# Patient Record
Sex: Male | Born: 1970 | Race: White | Hispanic: No | Marital: Married | Smoking: Never smoker
Health system: Southern US, Community
[De-identification: ages and names within clinical notes are randomized; demographics above are authoritative.]

## PROBLEM LIST (undated history)

## (undated) HISTORY — PX: WRIST SURGERY: SHX841

## (undated) HISTORY — PX: ELBOW SURGERY: SHX618

---

## 2008-03-11 ENCOUNTER — Encounter: Admission: RE | Admit: 2008-03-11 | Discharge: 2008-03-11 | Payer: Self-pay | Admitting: Emergency Medicine

## 2009-02-21 ENCOUNTER — Encounter: Admission: RE | Admit: 2009-02-21 | Discharge: 2009-02-21 | Payer: Self-pay | Admitting: Family Medicine

## 2010-07-24 ENCOUNTER — Encounter: Payer: Self-pay | Admitting: Family Medicine

## 2016-12-14 ENCOUNTER — Encounter (HOSPITAL_COMMUNITY): Payer: Self-pay | Admitting: Emergency Medicine

## 2016-12-14 ENCOUNTER — Emergency Department (HOSPITAL_COMMUNITY)
Admission: EM | Admit: 2016-12-14 | Discharge: 2016-12-14 | Disposition: A | Discharge status: Home / Self Care | Payer: Self-pay | Attending: Emergency Medicine | Admitting: Emergency Medicine

## 2016-12-14 ENCOUNTER — Emergency Department (HOSPITAL_COMMUNITY): Payer: Self-pay

## 2016-12-14 DIAGNOSIS — R079 Chest pain, unspecified: Secondary | ICD-10-CM | POA: Insufficient documentation

## 2016-12-14 LAB — CBC
HCT: 47.3 % (ref 39.0–52.0)
HEMOGLOBIN: 15.8 g/dL (ref 13.0–17.0)
MCH: 29.9 pg (ref 26.0–34.0)
MCHC: 33.4 g/dL (ref 30.0–36.0)
MCV: 89.4 fL (ref 78.0–100.0)
Platelets: 248 10*3/uL (ref 150–400)
RBC: 5.29 MIL/uL (ref 4.22–5.81)
RDW: 13.6 % (ref 11.5–15.5)
WBC: 8.9 10*3/uL (ref 4.0–10.5)

## 2016-12-14 LAB — BASIC METABOLIC PANEL
ANION GAP: 9 (ref 5–15)
BUN: 19 mg/dL (ref 6–20)
CALCIUM: 9.6 mg/dL (ref 8.9–10.3)
CO2: 25 mmol/L (ref 22–32)
Chloride: 103 mmol/L (ref 101–111)
Creatinine, Ser: 1 mg/dL (ref 0.61–1.24)
Glucose, Bld: 112 mg/dL — ABNORMAL HIGH (ref 65–99)
Potassium: 3.6 mmol/L (ref 3.5–5.1)
SODIUM: 137 mmol/L (ref 135–145)

## 2016-12-14 LAB — I-STAT TROPONIN, ED
TROPONIN I, POC: 0 ng/mL (ref 0.00–0.08)
Troponin i, poc: 0 ng/mL (ref 0.00–0.08)

## 2016-12-14 MED ORDER — SODIUM CHLORIDE 0.9 % IV BOLUS (SEPSIS)
1000.0000 mL | Freq: Once | INTRAVENOUS | Status: AC
  Administered 2016-12-14: 1000 mL via INTRAVENOUS

## 2016-12-14 NOTE — ED Notes (Signed)
Cardiology at bedside.

## 2016-12-14 NOTE — ED Triage Notes (Signed)
Onset this week left side chest discomfort seen at urgent care sent to ED for evaluation. States feels like passing out .

## 2016-12-14 NOTE — Discharge Instructions (Signed)
Please follow-up with your doctor for evaluation and treatment of your chest discomfort and other symptoms. Make sure to stay well hydrated. Please return to emergency department immediately if you develop any new or worsening symptoms.

## 2016-12-14 NOTE — ED Provider Notes (Signed)
[facility]-EMERGENCY DEPT Provider Note   CSN: [id number] Arrival date & time: 12/14/16  1151     History   Chief Complaint Chief Complaint  Patient presents with  . Chest Pain    HPI Alec Reynolds is a 46 y.o. male who is previously healthy who presents with a one-week history of intermittent chest discomfort. He describes his pain as intermittent. He notices it most at rest. He describes it as a tightness sensation in the left side of his chest. He has had associated left arm pain, however he equates this to similar feeling he has with his tennis elbow. Patient has also felt more fatigued lately and has had times where he feels that he is going to pass out. Patient denies any fevers, shortness of breath, abdominal pain, nausea, vomiting, urinary symptoms. Patient has not taken any medications at home for his symptoms. 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 He is also been working out and cycling a lot more. Patient does not admit to any more stress in his life than usual.  HPI  History reviewed. No pertinent past medical history.  There are no active problems to display for this patient.   History reviewed. No pertinent surgical history.     Home Medications    Prior to Admission medications   Medication Sig Start Date End Date Taking? Authorizing Provider  fluticasone (FLONASE) 50 MCG/ACT nasal spray Place 2 sprays into both nostrils daily.   Yes [provider]  ibuprofen (ADVIL,MOTRIN) 200 MG tablet Take 200 mg by mouth every 6 (six) hours as needed for fever.   Yes [provider]    Family History No family history on file.  Social History Social History  Substance Use Topics  . Smoking status: Never Smoker  . Smokeless tobacco: Never Used  . Alcohol use Yes     Allergies   Patient has no known allergies.   Review of Systems Review of Systems  Constitutional: Positive for fatigue. Negative for chills  and fever.  HENT: Negative for facial swelling and sore throat.   Respiratory: Negative for shortness of breath.   Cardiovascular: Positive for chest pain ("discomfort").  Gastrointestinal: Negative for abdominal pain, nausea and vomiting.  Genitourinary: Negative for dysuria.  Musculoskeletal: Negative for back pain.  Skin: Negative for rash and wound.  Neurological: Negative for headaches.  Psychiatric/Behavioral: The patient is not nervous/anxious.      Physical Exam Updated Vital Signs BP 111/71   Pulse (!) 53   Temp 97.2 F (36.2 C) (Oral)   Resp 16   Ht 6\' 4"  (1.93 m)   Wt 90.7 kg (200 lb)   SpO2 98%   BMI 24.34 kg/m   Physical Exam  Constitutional: He appears well-developed and well-nourished. No distress.  HENT:  Head: Normocephalic and atraumatic.  Mouth/Throat: Oropharynx is clear and moist. No oropharyngeal exudate.  Eyes: Conjunctivae are normal. Pupils are equal, round, and reactive to light. Right eye exhibits no discharge. Left eye exhibits no discharge. No scleral icterus.  Neck: Normal range of motion. Neck supple. No thyromegaly present.  Cardiovascular: Regular rhythm, normal heart sounds and intact distal pulses.  Exam reveals no gallop and no friction rub.   No murmur heard. Pulmonary/Chest: Effort normal and breath sounds normal. No stridor. No respiratory distress. He has no wheezes. He has no rales.  Abdominal: Soft. Bowel sounds are normal. He exhibits no distension. There is no tenderness. There is no  rebound and no guarding.  Musculoskeletal: He exhibits no edema.  Lymphadenopathy:    He has no cervical adenopathy.  Neurological: He is alert. Coordination normal.  Skin: Skin is warm and dry. No rash noted. He is not diaphoretic. No pallor.  Psychiatric: He has a normal mood and affect.  Nursing note and vitals reviewed.    ED Treatments / Results  Labs (all labs ordered are listed, but only abnormal results are displayed) Labs Reviewed    BASIC METABOLIC PANEL - Abnormal; Notable for the following:       Result Value   Glucose, Bld 112 (*)    All other components within normal limits  CBC  I-STAT TROPOININ, ED  I-STAT TROPOININ, ED    EKG  EKG Interpretation None       Radiology Dg Chest 2 View  Result Date: 12/14/2016 CLINICAL DATA:  Chest discomfort for 1 week EXAM: CHEST  2 VIEW COMPARISON:  None. FINDINGS: Lungs are clear. Heart size and pulmonary vascularity are normal. No adenopathy. No pneumothorax. No bone lesions. IMPRESSION: No edema or consolidation. Electronically Signed   By: Bretta BangWilliam  Woodruff III M.D.   On: 12/14/2016 13:12    Procedures Procedures (including critical care time)  Medications Ordered in ED Medications  sodium chloride 0.9 % bolus 1,000 mL (0 mLs Intravenous Stopped 12/14/16 1435)  sodium chloride 0.9 % bolus 1,000 mL (0 mLs Intravenous Stopped 12/14/16 1559)     Initial Impression / Assessment and Plan / ED Course  I have reviewed the triage vital signs and the nursing notes.  Pertinent labs & imaging results that were available during my care of the patient were reviewed by me and considered in my medical decision making (see chart for details).     CBC, BMP, delta troponin negative. PERC negative. CXR shows no active cardiopulmonary disease. EKG shows NSR. Patient with heart rates in the 50s, however patient states this is normal for him. Patient is an active tennis pro in the area. Doubt ACS. Patient given 2 L of fluid in the ED. Patient asymptomatic in the ED. Patient to follow up with PCP for further evaluation and treatment of symptoms. Strict return precautions discussed. Patient understands and agrees with plan. Patient vitals stable throughout ED course and discharged in satisfactory condition. I discussed patient case with Dr. Madilyn Hookees who guided the patient's management and agrees with plan.   Final Clinical Impressions(s) / ED Diagnoses   Final diagnoses:  Nonspecific  chest pain    New Prescriptions New Prescriptions   No medications on file     Verdis PrimeLaw, Cass Vandermeulen M, PA-C 12/14/16 1600    Tilden Fossaees, Elizabeth, MD 12/15/16 1029

## 2016-12-17 ENCOUNTER — Ambulatory Visit (INDEPENDENT_AMBULATORY_CARE_PROVIDER_SITE_OTHER)
Admission: RE | Admit: 2016-12-17 | Discharge: 2016-12-17 | Disposition: A | Discharge status: Home / Self Care | Payer: Self-pay | Source: Ambulatory Visit | Attending: Cardiology | Admitting: Cardiology

## 2016-12-17 ENCOUNTER — Other Ambulatory Visit (HOSPITAL_COMMUNITY): Payer: Self-pay | Admitting: *Deleted

## 2016-12-17 ENCOUNTER — Ambulatory Visit (INDEPENDENT_AMBULATORY_CARE_PROVIDER_SITE_OTHER): Payer: No Typology Code available for payment source | Admitting: Cardiology

## 2016-12-17 ENCOUNTER — Encounter: Payer: Self-pay | Admitting: Cardiology

## 2016-12-17 VITALS — BP 126/68 | HR 66 | Ht 76.0 in | Wt 201.8 lb

## 2016-12-17 DIAGNOSIS — R079 Chest pain, unspecified: Secondary | ICD-10-CM

## 2016-12-17 DIAGNOSIS — R42 Dizziness and giddiness: Secondary | ICD-10-CM | POA: Diagnosis not present

## 2016-12-17 DIAGNOSIS — R0789 Other chest pain: Secondary | ICD-10-CM

## 2016-12-17 NOTE — Patient Instructions (Signed)
Medication Instructions:  Continue current medications  Labwork: None Ordered  Testing/Procedures: Your physician has requested that you have an exercise tolerance test. For further information please visit [email or web address]. Please also follow instruction sheet, as given.  Your physician has requested that you have a Coronary Calcium Score Test.   Follow-Up: Your physician recommends that you schedule a follow-up appointment in: As Needed   Any Other Special Instructions Will Be Listed Below (If Applicable).   If you need a refill on your cardiac medications before your next appointment, please call your pharmacy.

## 2016-12-17 NOTE — Progress Notes (Signed)
Cardiology Office Note   Date:  12/17/2016   ID:  Alec Reynolds, DOB 30-Jul-1970, MRN [id number]  PCP:  Ileana LaddWong, Francis P, MD  Cardiologist:   Rollene RotundaJames Stana Bayon, MD  Referring:  Self  Chief Complaint  Patient presents with  . Chest Pain      History of Present Illness: Alec Reynolds is a 46 y.o. male who presents for evaluation of chest pain.  He has no past cardiac history.  He was seen in the ED on Friday with chest pain and had no objective evidence of ischemia.  I reviewed these records for this visit.  He said he had been feeling exactly what last week. He been more fatigued. He's had some slightly increased fatigue over time. He is a Patent attorneytennis professional and teaches.  On Friday and felt like he had some headaches. He was dizzy. He is fatigued. He actually at one point had to go down to one knee because he was somewhat lightheaded. He presented to urgent care and I don't have these records. He's always had a low heart rate was told it was low and his EKG was irregular but I don't have record of this. He was told he should follow-up with a cardiologist. However, because he was uncomfortable and anxious about this and presented to the emergency room. There were no abnormalities on his EKG there. He was given some IV hydration. Labs were okay. He didn't exercise over the weekend. He's had a nagging uncomfortable sensation with a sense of anxiety in his chest. He's not describing chest or arm discomfort. He's not been having any palpitations and has had no further presyncope or syncope. He's had no shortness of breath, PND or orthopnea. He has had some increased fatigue and slightly decreased exercise tolerance.   PMH:  None  Past Surgical History:  Procedure Laterality Date  . WRIST SURGERY       Current Outpatient Prescriptions  Medication Sig Dispense Refill  . fluticasone (FLONASE) 50 MCG/ACT nasal spray Place 2 sprays into both nostrils daily.    Marland Kitchen. ibuprofen  (ADVIL,MOTRIN) 200 MG tablet Take 200 mg by mouth every 6 (six) hours as needed for fever.     No current facility-administered medications for this visit.     Allergies:   Patient has no known allergies.    Social History:  The patient  reports that he has never smoked. He has never used smokeless tobacco. He reports that he drinks alcohol. He reports that he does not use drugs.   Family History:  The patient's family history includes Stroke (age of onset: 6765) in his father.    ROS:  Please see the history of present illness.   Otherwise, review of systems are positive for none.   All other systems are reviewed and negative.    PHYSICAL EXAM: VS:  BP 126/68   Pulse 66   Ht 6\' 4"  (1.93 m)   Wt 201 lb 12.8 oz (91.5 kg)   BMI 24.56 kg/m  , BMI Body mass index is 24.56 kg/m. GENERAL:  Well appearing HEENT:  Pupils equal round and reactive, fundi not visualized, oral mucosa unremarkable NECK:  No jugular venous distention, waveform within normal limits, carotid upstroke brisk and symmetric, no bruits, no thyromegaly LYMPHATICS:  No cervical, inguinal adenopathy LUNGS:  Clear to auscultation bilaterally BACK:  No CVA tenderness CHEST:  Unremarkable HEART:  PMI not displaced or sustained,S1 and S2 within normal limits, no S3, no S4, no clicks, no  rubs, no murmurs ABD:  Flat, positive bowel sounds normal in frequency in pitch, no bruits, no rebound, no guarding, no midline pulsatile mass, no hepatomegaly, no splenomegaly EXT:  2 plus pulses throughout, no edema, no cyanosis no clubbing SKIN:  No rashes no nodules NEURO:  Cranial nerves II through XII grossly intact, motor grossly intact throughout PSYCH:  Cognitively intact, oriented to person place and time    EKG:  EKG is not ordered today. The ekg ordered today demonstrates sinus rhythm, rate 63, axis within normal limits, intervals within normal limits, no acute ST-T wave changes.   Recent Labs: 12/14/2016: BUN 19;  Creatinine, Ser 1.00; Hemoglobin 15.8; Platelets 248; Potassium 3.6; Sodium 137    Lipid Panel No results found for: CHOL, TRIG, HDL, CHOLHDL, VLDL, LDLCALC, LDLDIRECT    Wt Readings from Last 3 Encounters:  12/17/16 201 lb 12.8 oz (91.5 kg)  12/14/16 200 lb (90.7 kg)      Other studies Reviewed: Additional studies/ records that were reviewed today include: ED records. Review of the above records demonstrates:  Please see elsewhere in the note.     ASSESSMENT AND PLAN:  CHEST PAIN:  I will bring the patient back for a POET (Plain Old Exercise Test). This will allow me to screen for obstructive coronary disease, risk stratify and very importantly provide a prescription for exercise.  I will also order a coronary calcium score.  DIZZINESS:  If this continues he might need an event monitor or Alive Cor  ANXIETY:  He has some increased stress and a sense of anxiety and we had a long discussion about this.     Current medicines are reviewed at length with the patient today.  The patient does not have concerns regarding medicines.  The following changes have been made:  no change  Labs/ tests ordered today include:   Orders Placed This Encounter  Procedures  . CT CARDIAC SCORING  . EXERCISE TOLERANCE TEST     Disposition:   FU with me as needed.      Signed, Rollene Rotunda, MD  12/17/2016 5:25 PM    [facility]

## 2016-12-18 ENCOUNTER — Encounter (HOSPITAL_COMMUNITY): Payer: Self-pay

## 2016-12-18 ENCOUNTER — Ambulatory Visit (HOSPITAL_COMMUNITY)
Admission: RE | Admit: 2016-12-18 | Discharge: 2016-12-18 | Disposition: A | Discharge status: Home / Self Care | Payer: No Typology Code available for payment source | Source: Ambulatory Visit | Attending: Cardiovascular Disease | Admitting: Cardiovascular Disease

## 2016-12-18 DIAGNOSIS — R079 Chest pain, unspecified: Secondary | ICD-10-CM | POA: Insufficient documentation

## 2016-12-19 LAB — EXERCISE TOLERANCE TEST
CHL CUP MPHR: 174 {beats}/min
CHL CUP RESTING HR STRESS: 67 {beats}/min
CSEPHR: 99 %
CSEPPHR: 173 {beats}/min
Estimated workload: 16.6 METS
Exercise duration (min): 13 min
Exercise duration (sec): 45 s
RPE: 18

## 2018-11-06 ENCOUNTER — Other Ambulatory Visit: Payer: Self-pay | Admitting: Family Medicine

## 2018-11-06 ENCOUNTER — Ambulatory Visit
Admission: RE | Admit: 2018-11-06 | Discharge: 2018-11-06 | Disposition: A | Discharge status: Home / Self Care | Payer: No Typology Code available for payment source | Source: Ambulatory Visit | Attending: Family Medicine | Admitting: Family Medicine

## 2018-11-06 ENCOUNTER — Other Ambulatory Visit: Payer: Self-pay

## 2018-11-06 DIAGNOSIS — M5416 Radiculopathy, lumbar region: Secondary | ICD-10-CM

## 2018-11-06 DIAGNOSIS — M25551 Pain in right hip: Secondary | ICD-10-CM

## 2018-12-01 ENCOUNTER — Telehealth (HOSPITAL_COMMUNITY): Payer: Self-pay | Admitting: Cardiology

## 2018-12-01 NOTE — Telephone Encounter (Signed)
Left message for patient to call back.  Need to schedule New Pt appt for tomorrow 12/02/2018.

## 2018-12-05 NOTE — Telephone Encounter (Signed)
Called and left message for pt to call me.  Dr. Shirlee Latch wanted to see if pt could come 1:30 or 2:30 this afternoon in-person for New CHF appt.

## 2018-12-08 NOTE — Telephone Encounter (Signed)
Called and left message for patient to call back.  Trying to see if pt can come 12/09/2018 @2 :40 for in-person visit with Dr. Aundra Dubin.

## 2019-12-22 ENCOUNTER — Encounter: Payer: Self-pay | Admitting: Physical Therapy

## 2019-12-22 ENCOUNTER — Other Ambulatory Visit: Payer: Self-pay

## 2019-12-22 ENCOUNTER — Ambulatory Visit: Payer: BC Managed Care – PPO | Attending: Physician Assistant | Admitting: Physical Therapy

## 2019-12-22 DIAGNOSIS — M5442 Lumbago with sciatica, left side: Secondary | ICD-10-CM | POA: Diagnosis present

## 2019-12-22 DIAGNOSIS — M4125 Other idiopathic scoliosis, thoracolumbar region: Secondary | ICD-10-CM | POA: Diagnosis present

## 2019-12-22 DIAGNOSIS — M545 Low back pain, unspecified: Secondary | ICD-10-CM | POA: Insufficient documentation

## 2019-12-22 DIAGNOSIS — G8929 Other chronic pain: Secondary | ICD-10-CM | POA: Insufficient documentation

## 2019-12-22 DIAGNOSIS — R350 Frequency of micturition: Secondary | ICD-10-CM | POA: Insufficient documentation

## 2019-12-22 DIAGNOSIS — R102 Pelvic and perineal pain: Secondary | ICD-10-CM

## 2019-12-22 DIAGNOSIS — R293 Abnormal posture: Secondary | ICD-10-CM | POA: Insufficient documentation

## 2019-12-23 DIAGNOSIS — R102 Pelvic and perineal pain: Secondary | ICD-10-CM | POA: Insufficient documentation

## 2019-12-23 DIAGNOSIS — R350 Frequency of micturition: Secondary | ICD-10-CM | POA: Insufficient documentation

## 2019-12-23 NOTE — Patient Instructions (Signed)
Open book ( handout)  ° °

## 2019-12-23 NOTE — Therapy (Addendum)
[facility] MAIN [facility] SERVICES [address] [place], [place], [address] Phone: [phone]   Fax:  [phone]  Physical Therapy Evaluation  Patient Details  Name: Daytona Cuttriss-Curtis MRN: [id number] Date of Birth: 1970-11-26 Referring Provider (PT): Fosnight    Encounter Date: 12/22/2019   PT End of Session - 12/23/19 1250    Visit Number 1    Number of Visits 10    Date for PT Re-Evaluation 03/02/20    PT Start Time 1504    PT Stop Time 1615    PT Time Calculation (min) 71 min           History reviewed. No pertinent past medical history.  Past Surgical History:  Procedure Laterality Date  . ELBOW SURGERY Left   . WRIST SURGERY      There were no vitals filed for this visit.    Subjective Assessment - 12/22/19 1520    Subjective 1)  Pelvic pain/ urinary urgency and frequency : Pt experienced urgency and frequency between 2016-2019. In 2019, pt experienced long periods of sitting with plane flight and other activities.  Pt experienced pain at the L anus to penis that worsened during the trip. Pt experienced increased urinary frequency. Pt has urgency to pee upon waking in the morning. Pain is not as bad (/10) was summer in 2019 ( 7/10) . Pain at that time, pain occurred with erection.  Pt rarely gets erections and when he gets an erection in the morning , it gets worse. Pt does everything he cannt have an erection due to the pain and discomfort. Currently the pain is constant in all positions sitting is worse. Pt has a pillow arrangement with pillows when lying down.  Gabapentin is helping with sleep and now getting 6 hours uninterrupted by pain and trips to bathroom. Currently nocturia has resolved. No leakage with urge.    2) Pain/ strain with bowel movements: Pt has 2 BM within an hour of waking up. Type 6 stools 75% of the time and 25% is stringy. Painful after bowel movements with need to pee.   Straining with bowel movements.    Bowl urgency occurs within minutes of waking up. Urinary frequency 1 x every 3 hours. No delay with emptying.  Pt is leaking less after urinating by 60% currently.     3) LBP  no sudden injury L sciatic  2012     Pertinent History R groin injury 2017 R ,  anxiety, stress Fx in R shin, low back as kid. 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 Pt also enjoyed cycling and stopped when the pain started. Pt used to cycle 2-3 hours 200 miles / day. Pt did not have a stretch routine. Pt completed Pelvic PT via telehealth one year and tried stretching but it made it worse.    Patient Stated Goals sit down for 2 hours  without hurting, Not have to feel like he has to pee all the time              [facility] PT Assessment - 12/22/19 1549      Assessment   Medical Diagnosis pelvic floor dysfunction     Referring Provider (PT) Fosnight       Precautions   Precautions None      Restrictions   Weight Bearing Restrictions No      Balance Screen  Has the patient fallen in the past 6 months No      Observation/Other Assessments   Observations slumped posture, guarded during assessment and Tx , able to relax with cues " relax onto the support beneath you"     Scoliosis R shoulder lower, L iliac crest higher, T/L junction concave curve    L sided handed      AROM   Overall AROM Comments R sideflexion limited > L, twinge of R LBP with R rotation ( Post Tx         Strength   Overall Strength --   R hip ext w/ compensation                      Objective measurements completed on examination: See above findings.     Pelvic Floor Special Questions - 12/22/19 1640    Diastasis Recti 4 fingers below umbilicus     External Perineal Exam through clothing, tenderness and tightness at R bulbospongiosus. ischiocavernosus, deep transverse perineal  ( post Tx: increased mobility and no tenderness            [facility] Adult PT  Treatment/Exercise - 12/23/19 1255      Therapeutic Activites    Therapeutic Activities Other Therapeutic Activities   POC, biopsychosocial, regional interdependent approaches    Other Therapeutic Activities discussed       Neuro Re-ed    Neuro Re-ed Details  cued for relaxation, HEP technique      Manual Therapy   Manual therapy comments medial glide at T/J junction with MWM to realign segments                  PT Long Term Goals - 01/21/20 1247      PT LONG TERM GOAL #1   Title Pt will demo decreased abdominal separation from 4 fingers width  to < 1 fingers width to improve IAP system for pelvic floor function    Time 4    Period Weeks    Status New    Target Date 01/19/20      PT LONG TERM GOAL #2   Title Pt will demo improved posture with more anterior tilt of pelvis, increased lumbar lordosis, and no cues for proper sitting/ standing posture  in order to minimize CLBP and return to more functional activities    Time 6    Period Weeks    Status New    Target Date 02/02/20      PT LONG TERM GOAL #3   Title Pt will demo no spinal deviation and more levelled pelvic girdle across 2 visits in order to advance towards deep core/ thoracolumbar strengthening exercises    Time 2    Period Weeks    Status New    Target Date 01/05/20      PT LONG TERM GOAL #4   Title Pt will demo decreased R pelvic floor mm tightness across 2 visits in order to minimize pelvic pain and straining with bowel movements and optimize IAP system properly    Time 8    Period Weeks    Status New    Target Date 02/16/20      PT LONG TERM GOAL #5   Title Pt's FOTO score for Pain to decrease from 29pts to < 24pts and Urinary from 38 pts to < 30 pts and Bowel from 17 pts to < 12 pts and  Bowel Leakage improve 59 pts to > 64 pts  Time 10    Target Date 03/15/20                          Plan - 12/23/19 1253    Clinical Impression Statement Pt is a 49 yo patient who is  experiencing pelvic pain with urinary frequency/ urgency, straining with bowel movements, and CLBP. These Sx impact his QOL and functional activities.   Pt's musculoskeletal assessment revealed spinal curvatures, uneven pelvic girdle/ shoulder height, weakness of deep core mm, diastasis recti, poor posture, limited spinal mobility ( R sideflexion/rotation more limited than L), increased pelvic floor mm tightness R > L, dyscoordination of deep core, and limited education on proper body mechanics to minimize straining pelvic floor / abdominal mm.  These are deficits that indicate an ineffective intraabdominal pressure system associated with increased risk for pt's Sx.    Pt will benefit from proper coordination training and education on fitness and functional positions in order to yield greater outcomes as pt performed sit-ups/ crunches in the past. Advised pt to not perform sit-ups and crunches as these movement patterns lead to more downward forces on her pelvic floor, negatively impacting abdominopelvic/spinal dysfunctions.   Pt was provided education on etiology of Sx with anatomy, physiology explanation with images along with the benefits of customized pelvic PT Tx based on pt's medical conditions and musculoskeletal deficits.  Explained the physiology of deep core mm coordination and roles of pelvic floor function in urination, defecation, sexual function, and postural control with deep core mm system.   Biopsychosocial and regional interdependent approaches will yield greater benefits in pt's POC due to the complexity of her medical Hx and the significant impact their Sx have had on their QOL. Pt would benefit from a biopsychosocial approach to yield optimal outcomes. Plan to build interdisciplinary team with pt's providers to optimize patient-centered care.   Following Tx today which pt tolerated without complaints, pt demo'd equal alignment of pelvic girdle, increased spinal mobility, and  decreased pelvic floor mm tightness on R. Plan to address forward head and improve posture and DRA at next session. Pt benefits from skilled PT.      Examination-Activity Limitations Toileting;Sit    Stability/Clinical Decision Making Evolving/Moderate complexity    Clinical Decision Making Moderate    Rehab Potential Good    PT Frequency 1x / week    PT Duration Other (comment)   10   PT Treatment/Interventions Neuromuscular re-education;Patient/family education;Therapeutic exercise;Scar mobilization;Taping;Manual techniques;Moist Heat;Therapeutic activities;Gait training;Balance training    Consulted and Agree with Plan of Care Patient           Patient will benefit from skilled therapeutic intervention in order to improve the following deficits and impairments:  Abnormal gait, Decreased coordination, Decreased range of motion, Difficulty walking, Decreased endurance, Decreased safety awareness, Increased muscle spasms, Decreased balance, Decreased strength, Decreased mobility, Postural dysfunction, Improper body mechanics, Decreased scar mobility, Decreased activity tolerance, Increased fascial restricitons, Hypomobility  Visit Diagnosis: Abnormal posture  Chronic bilateral low back pain with left-sided sciatica  Other idiopathic scoliosis, thoracolumbar region  Urinary frequency  Pelvic pain     Problem List Patient Active Problem List   Diagnosis Date Noted  . Urinary frequency 12/23/2019  . Pelvic pain 12/23/2019  . Low back pain 12/22/2019    Jerl Mina ,PT, DPT, E-RYT  12/23/2019, 1:01 PM  [facility] MAIN [facility] SERVICES [address] [place], [place], [address] Phone: [phone]   Fax:  [phone]  Name: Onix Jumper MRN: [id number] Date of Birth: 06-03-1971

## 2020-01-01 ENCOUNTER — Ambulatory Visit: Payer: BC Managed Care – PPO | Admitting: Physical Therapy

## 2020-01-14 ENCOUNTER — Encounter: Payer: BC Managed Care – PPO | Admitting: Physical Therapy

## 2020-01-21 ENCOUNTER — Ambulatory Visit: Payer: BC Managed Care – PPO | Attending: Physician Assistant | Admitting: Physical Therapy

## 2020-01-21 ENCOUNTER — Other Ambulatory Visit: Payer: Self-pay

## 2020-01-21 DIAGNOSIS — R293 Abnormal posture: Secondary | ICD-10-CM | POA: Diagnosis present

## 2020-01-21 DIAGNOSIS — G8929 Other chronic pain: Secondary | ICD-10-CM | POA: Diagnosis present

## 2020-01-21 DIAGNOSIS — R102 Pelvic and perineal pain: Secondary | ICD-10-CM | POA: Diagnosis present

## 2020-01-21 DIAGNOSIS — M4125 Other idiopathic scoliosis, thoracolumbar region: Secondary | ICD-10-CM

## 2020-01-21 DIAGNOSIS — R350 Frequency of micturition: Secondary | ICD-10-CM

## 2020-01-21 DIAGNOSIS — M5442 Lumbago with sciatica, left side: Secondary | ICD-10-CM | POA: Diagnosis present

## 2020-01-21 NOTE — Patient Instructions (Addendum)
Neck stretches at night/ morning on bed:  Lying on back - small sushi roll towel under neck  _ 6 directions  5 reps    _elbow circles in and out to lower shoulder blades down and back  10 reps    _angel wings, lower elbows down , keep arms touching bed  10 reps    At work:  _Doorway squat : bear stretch for shoulder blades  _wall stretch Clock stretch with head turns :  stand perpendicular to the wall, L side to the wall Tilt head to wall  Place L palm at 7 o clock Chin tuck like you are looking into armpit Look at "[place] on giant map  Swivel head with chin tucked to look in upper corner of ceiling as if you are look at  [place] on giant map "   5 reps   Switch direction, R palm on wall at 5 o 'clock   Chin tuck like you are looking into armpit Look at "[place]  on giant map  Swivel head with chin tucked to look in upper corner of ceiling as if you are look at  [facility] on giant map "   5 reps     ___  Body scan ( emailed)  Once a day

## 2020-01-21 NOTE — Therapy (Addendum)
[facility] MAIN [facility] SERVICES [address] [place], [place], [address] Phone: [phone]   Fax:  [phone]  Physical Therapy Treatment  Patient Details  Name: Alec Reynolds MRN: [id number] Date of Birth: 09-01-1970 Referring Provider (PT): Fosnight    Encounter Date: 01/21/2020   PT End of Session - 01/21/20 1451    Visit Number 2    Number of Visits 10    Date for PT Re-Evaluation 03/02/20    PT Start Time 1400    PT Stop Time 1500    PT Time Calculation (min) 60 min           No past medical history on file.  Past Surgical History:  Procedure Laterality Date  . ELBOW SURGERY Left   . WRIST SURGERY      There were no vitals filed for this visit.   Subjective Assessment - 01/21/20 1410    Subjective Pt has been doing his stretches 2 x day. Pt was generally 10-20%  better and and then he was stretching and he was pushing around his groin/ pelvic floor muscles and hit a nerve.  Pt notices more pain near his anal area. Pt is taking fiber which is helping with his bowel movements. Pt is working with a nutritionist.    Pertinent History R groin injury 2017R ,  anxiety, stress Fx in R shin, low back as kid. 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 Pt also enjoyed cycling and stopped when the pain started. Pt used to cycle 2-3 hours 200 miles / day. Pt did not have a stretch routine. Pt completed Pelvic PT via telehealth one year and tried stretching but it made it worse.    Patient Stated Goals sit down for 2 hours  without hurting, NO have to feel like he has to pee all the time              [facility] PT Assessment - 01/21/20 1406      Observation/Other Assessments   Observations excessive neck lifts in bed mobility    Scoliosis B shoulder levelled ,  iliac crest levelled, no T/L junction concave curve    L sided handed      AROM   Overall AROM Comments R  sideflexion bilateral to L WFL , no R LBP with R rotation       Palpation   Spinal mobility T9 hypomobile, T1-3 hypmobile, tightness L rhomoboids/ midtraps, scalenes/ upper traps, levator, anterior neck mm , occiput B                          [facility] Adult PT Treatment/Exercise - 01/21/20 1450      Therapeutic Activites    Other Therapeutic Activities guided relaxation body scan       Neuro Re-ed    Neuro Re-ed Details  cued for scapular mobility, cerv/ scap retraction      Modalities   Modalities Moist Heat      Moist Heat Therapy   Number Minutes Moist Heat 5 Minutes    Moist Heat Location Cervical   thoracic/ cervical durign guided body scan      Manual Therapy   Manual therapy comments STM/MWM to decrease tightness L rhomoboids/ midtraps, scalenes/ upper traps, levator, anterior neck mm , occiput B, AP /rotational mob to decrease T9 hypomobile, T1-3 hypmobile,  PT Long Term Goals - 01/21/20 1247      PT LONG TERM GOAL #1   Title Pt will demo decreased abdominal separation from 4 fingers width  to < 1 fingers width to improve IAP system for pelvic floor function    Time 4    Period Weeks    Status New    Target Date 01/19/20      PT LONG TERM GOAL #2   Title Pt will demo improved posture with more anterior tilt of pelvis, increased lumbar lordosis, and no cues for proper sitting/ standing posture  in order to minimize CLBP and return to more functional activities    Time 6    Period Weeks    Status New    Target Date 02/02/20      PT LONG TERM GOAL #3   Title Pt will demo no spinal deviation and more levelled pelvic girdle across 2 visits in order to advance towards deep core/ thoracolumbar strengthening exercises    Time 2    Period Weeks    Status New    Target Date 01/05/20      PT LONG TERM GOAL #4   Title Pt will demo decreased R pelvic floor mm tightness across 2 visits in order to minimize pelvic pain and  straining with bowel movements and optimize IAP system properly    Time 8    Period Weeks    Status New    Target Date 02/16/20      PT LONG TERM GOAL #5   Title Pt's FOTO score for Pain to decrease from 29pts to < 24pts and Urinary from 38 pts to < 30 pts and Bowel from 17 pts to < 12 pts and  Bowel Leakage improve 59 pts to > 64 pts    Time 10    Target Date 03/15/20                 Plan - 01/21/20 1452    Clinical Impression Statement Pt showed good carry over with levelled shoulders and pelvis and no more spinal deviations across the past month at his eval. Pt remains compliant with HEP. Required manual Tx to minimize L upper quadrant mm tightness associated with one sided arm movements in tennis playing and past surgeries/ injuries. Pt demo'd improve scapular mobility post Tx. Pt also required manual Tx to minimize forward head posture and requried cues for propioception of neck. Plan to address DRA at next session.   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 Pt continues to benefit from skilled PT   Examination-Activity Limitations Toileting;Sit    Stability/Clinical Decision Making Evolving/Moderate complexity    Rehab Potential Good    PT Frequency 1x / week    PT Duration Other (comment)   10   PT Treatment/Interventions Neuromuscular re-education;Patient/family education;Therapeutic exercise;Scar mobilization;Taping;Manual techniques;Moist Heat;Therapeutic activities;Gait training;Balance training    Consulted and Agree with Plan of Care Patient           Patient will benefit from skilled therapeutic intervention in order to improve the following deficits and impairments:  Abnormal gait, Decreased coordination, Decreased  range of motion, Difficulty walking, Decreased endurance, Decreased safety awareness, Increased muscle spasms, Decreased balance, Decreased strength, Decreased mobility, Postural dysfunction, Improper body mechanics, Decreased scar  mobility, Decreased activity tolerance, Increased fascial restricitons, Hypomobility  Visit Diagnosis: Chronic bilateral low back pain with left-sided sciatica  Abnormal posture  Other idiopathic scoliosis, thoracolumbar region  Urinary frequency  Pelvic pain     Problem List Patient Active Problem List   Diagnosis Date Noted  . Urinary frequency 12/23/2019  . Pelvic pain 12/23/2019  . Low back pain 12/22/2019    Mariane Masters 01/21/2020, 4:39 PM  [facility] MAIN [facility] SERVICES [address] [place], [place], [address] Phone: [phone]   Fax:  [phone]  Name: Alec Reynolds MRN: [id number] Date of Birth: 1971-05-08

## 2020-01-26 ENCOUNTER — Ambulatory Visit: Payer: BC Managed Care – PPO | Admitting: Physical Therapy

## 2020-01-26 ENCOUNTER — Other Ambulatory Visit: Payer: Self-pay

## 2020-01-26 DIAGNOSIS — R350 Frequency of micturition: Secondary | ICD-10-CM

## 2020-01-26 DIAGNOSIS — M4125 Other idiopathic scoliosis, thoracolumbar region: Secondary | ICD-10-CM

## 2020-01-26 DIAGNOSIS — R102 Pelvic and perineal pain: Secondary | ICD-10-CM

## 2020-01-26 DIAGNOSIS — G8929 Other chronic pain: Secondary | ICD-10-CM

## 2020-01-26 DIAGNOSIS — R293 Abnormal posture: Secondary | ICD-10-CM

## 2020-01-26 DIAGNOSIS — M5442 Lumbago with sciatica, left side: Secondary | ICD-10-CM | POA: Diagnosis not present

## 2020-01-26 NOTE — Patient Instructions (Addendum)
Lengthen Back rib by _ shoulder    Lie on   side , pillow between knees  Pull  arm overhead over mattress, grab the edge of mattress,pull it upward, drawing elbow away from ears  Breathing   Open book (handout)  Lying on  _ side , rotating  __ only this week   ___   Standing open book  --> arm swigns  Standing rib stretch: stand perpendicular to wall, arm slide up and ribs expand Standing midback stretch:  Bear stretch at doorway and angel wings by wall    ___  Strengthening to minimze forward head and tight pect mm  -Lying on back, knees bent    band under ballmounds  while laying on back w/ knees bent  "W" exercise  10 reps x 2 sets   Band is placed under feet, knees bent, feet are hip width apart Hold band with thumbs point out, keep upper arm and elbow touching the bed the whole time  - inhale and then exhale pull bands by bending elbows hands move in a "w"  (feel shoulder blades squeezing)   __________      Transition from standing to floor :  stand to floor transfer :      _ slow     _ mini squat      _ crawl down with one hand on thigh      _downward dog  - >  shoulders down and back-  walk the dog ( knee bents to lengthe hamstrings)      Floor to stand :   downward dog   crawl hands back, butt is back, knees behind toes -> squat  Hands at waist , elbows back, chest lifts   _____   Car seat modification  Folded towels to fill bucket seat Folded beach towel long ways against back of seat into head rest to fill in the space so your head and back and sacrum aligns  Hands at 9 and 3 o clock  Feet and heel on the mat  Back of the hips against the seat evenly       __

## 2020-01-26 NOTE — Addendum Note (Signed)
Addended by: Mariane Masters on: 01/26/2020 10:19 AM   Modules accepted: Orders

## 2020-01-26 NOTE — Therapy (Signed)
[facility] MAIN [facility] SERVICES [address] [place], [place], [address] Phone: [phone]   Fax:  [phone]  Physical Therapy Treatment  Patient Details  Name: Alec Reynolds MRN: [id number] Date of Birth: December 10, 1970 Referring Provider (PT): Fosnight    Encounter Date: 01/26/2020   PT End of Session - 01/26/20 1458    Visit Number 3    Number of Visits 10    Date for PT Re-Evaluation 03/02/20    PT Start Time 1402    PT Stop Time 1500    PT Time Calculation (min) 58 min    Activity Tolerance Patient tolerated treatment well           No past medical history on file.  Past Surgical History:  Procedure Laterality Date  . ELBOW SURGERY Left   . WRIST SURGERY      There were no vitals filed for this visit.   Subjective Assessment - 01/26/20 1406    Subjective Pt noticed slowing down with doing the stretches. Pt noticed his urinary frequency in the morning since the last few days has decreased from 4 x within one hour to 2 x/ hr.    Pertinent History R groin injury 2017R ,  anxiety, stress Fx in R shin, low back as kid. 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 Pt also enjoyed cycling and stopped when the pain started. Pt used to cycle 2-3 hours 200 miles / day. Pt did not have a stretch routine. Pt completed Pelvic PT via telehealth one year and tried stretching but it made it worse.    Patient Stated Goals sit down for 2 hours  without hurting, Not  have to feel like he has to pee all the time              [facility] PT Assessment - 01/26/20 1450      Floor to Stand   Comments straining onto pelvic floor, poor alignment of knees      Palpation   Spinal mobility no deviations , tightness at upper trap levator L > R , intercostals B lower ribs limited excursion                          [facility] Adult PT Treatment/Exercise - 01/26/20 1451       Therapeutic Activites    Other Therapeutic Activities reviewed past HEP       Neuro Re-ed    Neuro Re-ed Details  cued for flolor <> stand technique to incorporate more stretches, less straining pelvic floor, cued for new HEP for scapulothoracic strengthening      Modalities   Modalities Moist Heat      Moist Heat Therapy   Number Minutes Moist Heat 5 Minutes    Moist Heat Location --   throacic     Manual Therapy   Manual therapy comments STM/MWM at probelm areas noted in assessment                        PT Long Term Goals - 01/21/20 1247      PT LONG TERM GOAL #1   Title Pt will demo decreased abdominal separation from 4 fingers width  to < 1 fingers width to improve IAP system for pelvic floor function    Time 4    Period Weeks  Status New    Target Date 01/19/20      PT LONG TERM GOAL #2   Title Pt will demo improved posture with more anterior tilt of pelvis, increased lumbar lordosis, and no cues for proper sitting/ standing posture  in order to minimize CLBP and return to more functional activities    Time 6    Period Weeks    Status New    Target Date 02/02/20      PT LONG TERM GOAL #3   Title Pt will demo no spinal deviation and more levelled pelvic girdle across 2 visits in order to advance towards deep core/ thoracolumbar strengthening exercises    Time 2    Period Weeks    Status New    Target Date 01/05/20      PT LONG TERM GOAL #4   Title Pt will demo decreased R pelvic floor mm tightness across 2 visits in order to minimize pelvic pain and straining with bowel movements and optimize IAP system properly    Time 8    Period Weeks    Status New    Target Date 02/16/20      PT LONG TERM GOAL #5   Title Pt's FOTO score for Pain to decrease from 29pts to < 24pts and Urinary from 38 pts to < 30 pts and Bowel from 17 pts to < 12 pts and  Bowel Leakage improve 59 pts to > 64 pts    Time 10    Target Date 03/15/20                 Plan  - 01/26/20 1459    Clinical Impression Statement Pt demo'd improved posture with less forward head after manual Tx today and advanced to scapulothoracic strengthening. Pt required cues for floor <> stand to minimize strainining pelvic floor. Discussed car seat modifications to promote better spinal alignment and ue of squatty potty with bowel movements to promote less straining. Pt continues to benefit from skilled PT    Examination-Activity Limitations Toileting;Sit    Stability/Clinical Decision Making Evolving/Moderate complexity    Rehab Potential Good    PT Frequency 1x / week    PT Duration Other (comment)   10   PT Treatment/Interventions Neuromuscular re-education;Patient/family education;Therapeutic exercise;Scar mobilization;Taping;Manual techniques;Moist Heat;Therapeutic activities;Gait training;Balance training    Consulted and Agree with Plan of Care Patient           Patient will benefit from skilled therapeutic intervention in order to improve the following deficits and impairments:  Abnormal gait, Decreased coordination, Decreased range of motion, Difficulty walking, Decreased endurance, Decreased safety awareness, Increased muscle spasms, Decreased balance, Decreased strength, Decreased mobility, Postural dysfunction, Improper body mechanics, Decreased scar mobility, Decreased activity tolerance, Increased fascial restricitons, Hypomobility  Visit Diagnosis: Chronic bilateral low back pain with left-sided sciatica  Abnormal posture  Other idiopathic scoliosis, thoracolumbar region  Urinary frequency  Pelvic pain     Problem List Patient Active Problem List   Diagnosis Date Noted  . Urinary frequency 12/23/2019  . Pelvic pain 12/23/2019  . Low back pain 12/22/2019    Mariane Masters ,PT, DPT, E-RYT  01/26/2020, 3:18 PM  [facility] MAIN [facility] SERVICES [address] [place], [place], [address] Phone: [phone]   Fax:   [phone]  Name: Alec Reynolds MRN: [id number] Date of Birth: Jul 14, 1970

## 2020-02-02 ENCOUNTER — Encounter: Payer: BC Managed Care – PPO | Admitting: Physical Therapy

## 2020-02-04 ENCOUNTER — Ambulatory Visit: Payer: BC Managed Care – PPO | Attending: Physician Assistant | Admitting: Physical Therapy

## 2020-02-04 ENCOUNTER — Other Ambulatory Visit: Payer: Self-pay

## 2020-02-04 DIAGNOSIS — R350 Frequency of micturition: Secondary | ICD-10-CM | POA: Diagnosis present

## 2020-02-04 DIAGNOSIS — M4125 Other idiopathic scoliosis, thoracolumbar region: Secondary | ICD-10-CM

## 2020-02-04 DIAGNOSIS — M533 Sacrococcygeal disorders, not elsewhere classified: Secondary | ICD-10-CM | POA: Diagnosis present

## 2020-02-04 DIAGNOSIS — R102 Pelvic and perineal pain unspecified side: Secondary | ICD-10-CM

## 2020-02-04 DIAGNOSIS — R293 Abnormal posture: Secondary | ICD-10-CM

## 2020-02-04 DIAGNOSIS — G8929 Other chronic pain: Secondary | ICD-10-CM | POA: Diagnosis present

## 2020-02-04 DIAGNOSIS — M5442 Lumbago with sciatica, left side: Secondary | ICD-10-CM | POA: Diagnosis present

## 2020-02-04 DIAGNOSIS — M2170 Unequal limb length (acquired), unspecified site: Secondary | ICD-10-CM | POA: Diagnosis not present

## 2020-02-04 NOTE — Patient Instructions (Addendum)
Applying Pelvic tilts:  Finding a comfortable position when laying on your back  Laying on your back, lift hips up, then scoot tail under, lowering ribs / midback first, then the low back Pillow under knees    Decreasing Low back pain:  Pelvic tilts Forward, Back, and Neutral   STANDING ( neutral is where you want to practice finding more and more often. More weight across the ball mound of feet and heels not only the heels, and not locking the knees.   SITTING: feet under knees, hip width apart. Weigh on the sitting bones. Thumb on the back iliac crest, Index finger at the front of the hip. Rock through 3 positions to find neutral, press in the feet and sense the sitting bones ( ischial tuberosity) in contact to the seat.   Posterior tilt ( thumb is lower)  Anterior tilt ( index finger is lower).   Neutral ( thumb and index finger is levelled)    Sitting at work chair that may have a dip in the seat Place folded towel/ blanket placed towards the back of the seat , sitting on sitting bones , don't lean to the back of the chair   ___  L  sidelying  - press onto sidebody/ pinky toe side of L   R quad stretch with strap/ towel at L ankle 20 reps  Add to daily stretches    ___  Try wearing shoe lift in R shoe and if it gets uncomfortable or more pain, remove it   ___ THEME this week:  notice your contact points in whatever shape / position you are in, arrive here, settle in Catch you sitting posture, feet and sitting on floor and chair, pelvic tilts, stack up spine, breath at diaphragm not into chest/ neck  Keep up your practices, your spinal and neck/ shoulder muscles are much looser!   ___  Can you contact your primary care doctor to see about a sleep study ? I will email you separately the research articles that explain the correlation with nocturia and OSA. It will be good to rule it out/ in.     ___  Carseat: move it close enough to steering wheel so elbow and  shoulder hang relaxed Use the folded towel lengthwise to bottom of seat back to below the head rest. This can help ensure more the spinal curves in the low back and neck instead of the slumped posture

## 2020-02-04 NOTE — Therapy (Signed)
[facility] MAIN [facility] SERVICES [address] [place], [place], [address] Phone: [phone]   Fax:  [phone]  Physical Therapy Treatment  Patient Details  Name: Alec Reynolds MRN: [id number] Date of Birth: 1970-09-18 Referring Provider (PT): Fosnight    Encounter Date: 02/04/2020   PT End of Session - 02/04/20 1624    Visit Number 4    Number of Visits 10    Date for PT Re-Evaluation 03/02/20    PT Start Time 1406    PT Stop Time 1507    PT Time Calculation (min) 61 min    Activity Tolerance Patient tolerated treatment well           No past medical history on file.  Past Surgical History:  Procedure Laterality Date   ELBOW SURGERY Left    WRIST SURGERY      There were no vitals filed for this visit.   Subjective Assessment - 02/04/20 1411    Subjective Pt reported his Sx got worse last week. Pt started eating mush as his nutritionist recommended and he noticed his bowel movements were better formed and then he noticed more diarrhea. In the morning, he noticed he is peeing more times. When he sits down, he noticed his pelvic pain has moved back towards anal  area.  Pt went to an interview and he was so concerned about making it to the bathroom in time.    Pertinent History R groin injury 2017R ,  anxiety, stress Fx in R shin, low back as kid. 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 Pt also enjoyed cycling and stopped when the pain started. Pt used to cycle 2-3 hours 200 miles / day. Pt did not have a stretch routine. Pt completed Pelvic PT via telehealth one year and tried stretching but it made it worse.    Patient Stated Goals sit down for 2 hours  without hurting, Not  have to feel like he has to pee all the time              [facility] PT Assessment - 02/04/20 1438      Palpation   Spinal mobility no deviations at thoracic     SI assessment  L iliac  crest higher ( Post Tx with shoe lift in R shoe)   Limited nutation of sacrum/ coccyx       Ambulation/Gait   Gait Comments R trunk lean ( with shoe lift in R shoe, less R trunk lean)                       Pelvic Floor Special Questions - 02/04/20 1657    External Perineal Exam through clothing, tightness at B obt int              [facility] Adult PT Treatment/Exercise - 02/04/20 1438      Therapeutic Activites    Other Therapeutic Activities active listening, positive encouragement, discussed role of posture, restoring anterior tilit of pelvis for bowel/ urinary function, restoring sleep cycle, recommended pt for sleep study to screen for OSA given nocturia Sx, provided R shoe lift        Neuro Re-ed    Neuro Re-ed Details  cued for pelvic tilts       Manual Therapy   Manual therapy comments STM/MWM at obt int R  PT Long Term Goals - 01/21/20 1247      PT LONG TERM GOAL #1   Title Pt will demo decreased abdominal separation from 4 fingers width  to < 1 fingers width to improve IAP system for pelvic floor function    Time 4    Period Weeks    Status New    Target Date 01/19/20      PT LONG TERM GOAL #2   Title Pt will demo improved posture with more anterior tilt of pelvis, increased lumbar lordosis, and no cues for proper sitting/ standing posture  in order to minimize CLBP and return to more functional activities    Time 6    Period Weeks    Status New    Target Date 02/02/20      PT LONG TERM GOAL #3   Title Pt will demo no spinal deviation and more levelled pelvic girdle across 2 visits in order to advance towards deep core/ thoracolumbar strengthening exercises    Time 2    Period Weeks    Status New    Target Date 01/05/20      PT LONG TERM GOAL #4   Title Pt will demo decreased R pelvic floor mm tightness across 2 visits in order to minimize pelvic pain and straining with bowel movements and optimize IAP system  properly    Time 8    Period Weeks    Status New    Target Date 02/16/20      PT LONG TERM GOAL #5   Title Pt's FOTO score for Pain to decrease from 29pts to < 24pts and Urinary from 38 pts to < 30 pts and Bowel from 17 pts to < 12 pts and  Bowel Leakage improve 59 pts to > 64 pts    Time 10    Target Date 03/15/20                  x, Plan - 02/04/20 1645    Clinical Impression Statement Pt demo'd no more spinal deviations/ less forward head posture with his compliance to HEP. However, pt reports pt experienced LBP and pelvic pain/ bowel/ urinary issues worsened this week.  Pt demo'd L iliac crest heigh higher than R, R foot pronation.  Provided shoe lift to R and pt demo'd less R trunk lean in gait. Provided manual Tx to minimize pelvic floor tightness and promote nutation of SIJ. Provided excessive cues for anterior tilt and upright posture and educated pt the role of posture.  Suspect leg length difference ( not spinal mm imbalance)  Is contributing to his one sided pelvic pain. Excessive cues provided for pelvic tilt to minimize counternutation of pelvic/ flexion of coccyx/ slumped posture. Plan to address DRA and progress deep core strengthening at next session.   Advised pt to f/u with PCP for sleep study and provided information / research on the role of nocturia as he is awakened in the middle of the night with urge to urinate at night. Pt voiced the impact of his Sx on his QOL and work. Pt required biopsychosocial approaches ( active listening, encouragement, education on his MSK deficits , stress, and tools provided for lifestyle improvements).   Pt continues to benefit from skilled PT.     Examination-Activity Limitations Toileting;Sit    Stability/Clinical Decision Making Evolving/Moderate complexity    Rehab Potential Good    PT Frequency 1x / week    PT Duration Other (comment)   10  PT Treatment/Interventions Neuromuscular re-education;Patient/family  education;Therapeutic exercise;Scar mobilization;Taping;Manual techniques;Moist Heat;Therapeutic activities;Gait training;Balance training    Consulted and Agree with Plan of Care Patient           Patient will benefit from skilled therapeutic intervention in order to improve the following deficits and impairments:  Abnormal gait, Decreased coordination, Decreased range of motion, Difficulty walking, Decreased endurance, Decreased safety awareness, Increased muscle spasms, Decreased balance, Decreased strength, Decreased mobility, Postural dysfunction, Improper body mechanics, Decreased scar mobility, Decreased activity tolerance, Increased fascial restricitons, Hypomobility  Visit Diagnosis: Chronic bilateral low back pain with left-sided sciatica  Sacrococcygeal disorders, not elsewhere classified  Abnormal posture  Other idiopathic scoliosis, thoracolumbar region  Urinary frequency  Pelvic pain     Problem List Patient Active Problem List   Diagnosis Date Noted   Urinary frequency 12/23/2019   Pelvic pain 12/23/2019   Low back pain 12/22/2019    Mariane Masters ,PT, DPT, E-RYT  02/04/2020, 5:56 PM  [place] [facility] MAIN [facility] SERVICES [address] [place], [place], [address] Phone: [phone]   Fax:  [phone]  Name: Alec Reynolds MRN: [id number] Date of Birth: 1971/01/15

## 2020-02-09 ENCOUNTER — Encounter: Payer: BC Managed Care – PPO | Admitting: Physical Therapy

## 2020-02-11 ENCOUNTER — Other Ambulatory Visit: Payer: Self-pay

## 2020-02-11 ENCOUNTER — Ambulatory Visit: Payer: BC Managed Care – PPO | Admitting: Physical Therapy

## 2020-02-11 DIAGNOSIS — M5442 Lumbago with sciatica, left side: Secondary | ICD-10-CM | POA: Diagnosis not present

## 2020-02-11 DIAGNOSIS — R293 Abnormal posture: Secondary | ICD-10-CM

## 2020-02-11 DIAGNOSIS — M4125 Other idiopathic scoliosis, thoracolumbar region: Secondary | ICD-10-CM

## 2020-02-11 DIAGNOSIS — R350 Frequency of micturition: Secondary | ICD-10-CM

## 2020-02-11 DIAGNOSIS — R102 Pelvic and perineal pain: Secondary | ICD-10-CM

## 2020-02-11 DIAGNOSIS — M533 Sacrococcygeal disorders, not elsewhere classified: Secondary | ICD-10-CM

## 2020-02-11 DIAGNOSIS — G8929 Other chronic pain: Secondary | ICD-10-CM

## 2020-02-11 NOTE — Therapy (Signed)
[place] [facility] MAIN [facility] SERVICES [address] [place], [place], [address] Phone: [phone]   Fax:  [phone]  Physical Therapy Treatment  Patient Details  Name: Alec Reynolds MRN: [id number] Date of Birth: 1971/01/20 Referring Provider (PT): Fosnight    Encounter Date: 02/11/2020   PT End of Session - 02/11/20 1615    Visit Number 5    Number of Visits 10    Date for PT Re-Evaluation 03/02/20    PT Start Time 1400    PT Stop Time 1530    PT Time Calculation (min) 90 min    Activity Tolerance Patient tolerated treatment well           No past medical history on file.  Past Surgical History:  Procedure Laterality Date  . ELBOW SURGERY Left   . WRIST SURGERY      There were no vitals filed for this visit.   Subjective Assessment - 02/11/20 1408    Subjective The shoe lift is working well and he has noticed his R LBP has much less compared to beginning of PT but it still comes and go. Pt noticed his buttock and penile has flared up. When sitting , he feels like he has to have a bowel movement. Putting his back pack on with forward bending makes him want to pee. Bending forward to pick up a tennis ball causes posterior pelvic floor pain.    Pertinent History R groin injury 2017R ,  anxiety, stress Fx in R shin, low back as kid. 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 Pt also enjoyed cycling and stopped when the pain started. Pt used to cycle 2-3 hours 200 miles / day. Pt did not have a stretch routine. Pt completed Pelvic PT via telehealth one year and tried stretching but it made it worse.    Patient Stated Goals sit down for 2 hours  without hurting, Not  have to feel like he has to pee all the time              [facility] PT Assessment - 02/11/20 1412      Palpation   Spinal mobility no spinal deviations noted      SI assessment  iliac crest levelled      Palpation comment increased adductor magnus both attachments tightness B       Ambulation/Gait   Gait Comments no R trunk lean                       Pelvic Floor Special Questions - 02/11/20 1618    External Perineal Exam through clothing, tightness at B bulbospongiousus, ischiocavernosus, deep transverse perineal mm ,              [facility] Adult PT Treatment/Exercise - 02/11/20 1412      Therapeutic Activites    Other Therapeutic Activities explained anatomy and physiology , provided encouragement on improvements and further progress with pelvic pain / urgency       Neuro Re-ed    Neuro Re-ed Details  cued for pelvic tilts, hip stretches and technique to not over do, lower kinetic chain alignment      Modalities   Modalities Moist Heat      Moist Heat Therapy   Number Minutes Moist Heat 5 Minutes    Moist Heat Location --   adductor/ pelvic floor  Manual Therapy   Manual therapy comments STM/MWM at pelvic floor and adductor mm noted in assessment to promote SIJ and pelvic floor mobility                      PT Long Term Goals - 02/11/20 1415      PT LONG TERM GOAL #1   Title Pt will demo decreased abdominal separation from 4 fingers width  to < 1 fingers width to improve IAP system for pelvic floor function    Time 4    Period Weeks    Status Achieved      PT LONG TERM GOAL #2   Title Pt will demo improved posture with more anterior tilt of pelvis, increased lumbar lordosis, and no cues for proper sitting/ standing posture  in order to minimize CLBP and return to more functional activities    Time 6    Period Weeks    Status Achieved      PT LONG TERM GOAL #3   Title Pt will demo no spinal deviation and more levelled pelvic girdle across 2 visits in order to advance towards deep core/ thoracolumbar strengthening exercises    Time 2    Period Weeks    Status Achieved      PT LONG TERM GOAL #4   Title Pt will demo decreased R pelvic  floor mm tightness across 2 visits in order to minimize pelvic pain and straining with bowel movements and optimize IAP system properly    Time 8    Period Weeks    Status New      PT LONG TERM GOAL #5   Title Pt's FOTO score for Pain to decrease from 29pts to < 24pts and Urinary from 38 pts to < 30 pts and Bowel from 17 pts to < 12 pts and  Bowel Leakage improve 59 pts to > 64 pts    Time 10      PT LONG TERM GOAL #6   Title Pt will report no urge to urinate when sitting or bending forward to put on his back pack  in order to attend meetings or leave the house    Time 8    Period Weeks    Status New    Target Date 04/07/20      PT LONG TERM GOAL #7   Title Pt 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 Pt's DRA has resolved which will improve IAP system.   Pt required external pelvic floor mm/ SIJ  Releases after which pt was able to cross thighs, a position pt had not been able to perform in the past due to his Sx. Pt reported feeling looser in the pelvic area post Tx during the simulated positions that were causing urgency and pain. Plan to assess the carry over with today's session on his ADLs. Pt required excessive cues for stability in lower kinetic chain.   Pt continues to benefit from skilled PT    Examination-Activity Limitations Toileting;Sit  Stability/Clinical Decision Making Evolving/Moderate complexity    Rehab Potential Good    PT Frequency 1x / week    PT Duration Other (comment)   10   PT Treatment/Interventions Neuromuscular re-education;Patient/family education;Therapeutic exercise;Scar mobilization;Taping;Manual techniques;Moist Heat;Therapeutic  activities;Gait training;Balance training    Consulted and Agree with Plan of Care Patient           Patient will benefit from skilled therapeutic intervention in order to improve the following deficits and impairments:  Abnormal gait, Decreased coordination, Decreased range of motion, Difficulty walking, Decreased endurance, Decreased safety awareness, Increased muscle spasms, Decreased balance, Decreased strength, Decreased mobility, Postural dysfunction, Improper body mechanics, Decreased scar mobility, Decreased activity tolerance, Increased fascial restricitons, Hypomobility  Visit Diagnosis: Chronic bilateral low back pain with left-sided sciatica  Sacrococcygeal disorders, not elsewhere classified  Abnormal posture  Other idiopathic scoliosis, thoracolumbar region  Urinary frequency  Pelvic pain     Problem List Patient Active Problem List   Diagnosis Date Noted  . Urinary frequency 12/23/2019  . Pelvic pain 12/23/2019  . Low back pain 12/22/2019    Mariane Masters ,PT, DPT, E-RYT  02/11/2020, 4:25 PM  [place] [facility] MAIN [facility] SERVICES [address] [place], [place], [address] Phone: [phone]   Fax:  [phone]  Name: Linc Renne MRN: [id number] Date of Birth: 26-May-1971

## 2020-02-11 NOTE — Patient Instructions (Addendum)
Applying Pelvic tilts:  Posterior tilt ( thumb is lower)  Anterior tilt ( index finger is lower).   Neutral ( thumb and index finger is levelled)    KEEP knee pointed up to the sky, feet with 4 points down , head of femur joints into socket, back of PSIS on floor   ___   Stretches : (Cuing provided for proper alignment)  Stretches for your legs: LAYING on Back Use upper arms and elbows for stability when pulling strap Opposite knee bent and foot firm in align with hip     Figure - 4 stretch with strap under the opp knee  Exhale and pull legs up, shoulders, head down   Cross thigh over  scoot hips to R, cross R leg over L and straighten knee with strap on [place],  Applying Pelvic tilts: .   Posterior tilt ( thumb is lower)  Anterior tilt ( index finger is lower).   Neutral ( thumb and index finger is levelled)    KEEP knee pointed up to the sky, feet with 4 points down , head of femur joints into socket, back of PSIS on floor   ___

## 2020-02-17 ENCOUNTER — Ambulatory Visit: Payer: BC Managed Care – PPO | Admitting: Physical Therapy

## 2020-02-17 ENCOUNTER — Other Ambulatory Visit: Payer: Self-pay

## 2020-02-17 VITALS — BP 104/68 | HR 71

## 2020-02-17 DIAGNOSIS — G8929 Other chronic pain: Secondary | ICD-10-CM

## 2020-02-17 DIAGNOSIS — R293 Abnormal posture: Secondary | ICD-10-CM

## 2020-02-17 DIAGNOSIS — R102 Pelvic and perineal pain: Secondary | ICD-10-CM

## 2020-02-17 DIAGNOSIS — M5442 Lumbago with sciatica, left side: Secondary | ICD-10-CM | POA: Diagnosis not present

## 2020-02-17 DIAGNOSIS — M4125 Other idiopathic scoliosis, thoracolumbar region: Secondary | ICD-10-CM

## 2020-02-17 DIAGNOSIS — R350 Frequency of micturition: Secondary | ICD-10-CM

## 2020-02-17 DIAGNOSIS — M533 Sacrococcygeal disorders, not elsewhere classified: Secondary | ICD-10-CM

## 2020-02-18 NOTE — Therapy (Signed)
[place] [facility] MAIN [facility] SERVICES [address] [place], [place], [address] Phone: [phone]   Fax:  [phone]  Physical Therapy Treatment  Patient Details  Name: Alec Reynolds MRN: [id number] Date of Birth: 15-Sep-1970 Referring Provider (PT): Fosnight    Encounter Date: 02/17/2020   PT End of Session - 02/18/20 1048    Visit Number 6    Number of Visits 10    Date for PT Re-Evaluation 03/02/20    PT Start Time 1210    PT Stop Time 1340    PT Time Calculation (min) 90 min    Activity Tolerance Patient tolerated treatment well           No past medical history on file.  Past Surgical History:  Procedure Laterality Date   ELBOW SURGERY Left    WRIST SURGERY      Vitals:   02/17/20 1230  BP: 104/68  Pulse: 71     Subjective Assessment - 02/17/20 1217    Subjective Pt reported he noticed after last week's Tx, he had not felt as relaxed before as when he walked out of the treatment. He said to himself, " Wow, I did not hurt. I smiled. " The area below the testicles felt looser.  Pt reported he is noticing he getting close to a panic attack that was similar to somethign that happened 3 years ago. Pt is not taking medications for anxiety currently. Pt is anxious driving to work, being at the work with anxiety that he can have a accident or not able to make it to the bathroom. The pain with sitting at work is still present. " I feel out of control.".  This morning, pt allowed himself to "take chill pill" and turned off the lights and laid on the floor and breathed.  He felt better.  Pt had to pee several times.    Pertinent History R groin injury 2017R ,  anxiety, stress Fx in R shin, low back as kid. 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 Pt also enjoyed cycling and stopped when the pain started. Pt used to cycle 2-3 hours 200 miles / day. Pt did not have a stretch  routine. Pt completed Pelvic PT via telehealth one year and tried stretching but it made it worse.    Patient Stated Goals sit down for 2 hours  without hurting, Not  have to feel like he has to pee all the time                                          PT Long Term Goals - 02/11/20 1415      PT LONG TERM GOAL #1   Title Pt will demo decreased abdominal separation from 4 fingers width  to < 1 fingers width to improve IAP system for pelvic floor function    Time 4    Period Weeks    Status Achieved      PT LONG TERM GOAL #2   Title Pt will demo improved posture with more anterior tilt of pelvis, increased lumbar lordosis, and no cues for proper sitting/ standing posture  in order to minimize CLBP and return to more functional activities    Time 6    Period Weeks    Status  Achieved      PT LONG TERM GOAL #3   Title Pt will demo no spinal deviation and more levelled pelvic girdle across 2 visits in order to advance towards deep core/ thoracolumbar strengthening exercises    Time 2    Period Weeks    Status Achieved      PT LONG TERM GOAL #4   Title Pt will demo decreased R pelvic floor mm tightness across 2 visits in order to minimize pelvic pain and straining with bowel movements and optimize IAP system properly    Time 8    Period Weeks    Status New      PT LONG TERM GOAL #5   Title Pt's FOTO score for Pain to decrease from 29pts to < 24pts and Urinary from 38 pts to < 30 pts and Bowel from 17 pts to < 12 pts and  Bowel Leakage improve 59 pts to > 64 pts    Time 10      PT LONG TERM GOAL #6   Title Pt will report no urge to urinate when sitting or bending forward to put on his back pack  in order to attend meetings or leave the house    Time 8    Period Weeks    Status New    Target Date 04/07/20      PT LONG TERM GOAL #7   Title Pt 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      Time 8    Period Weeks    Status New    Target Date 04/07/20                 Plan - 02/18/20 1049    Clinical Impression Statement Pt    Examination-Activity Limitations Toileting;Sit    Stability/Clinical Decision Making Evolving/Moderate complexity    Rehab Potential Good    PT Frequency 1x / week    PT Duration Other (comment)   10   PT Treatment/Interventions Neuromuscular re-education;Patient/family education;Therapeutic exercise;Scar mobilization;Taping;Manual techniques;Moist Heat;Therapeutic activities;Gait training;Balance training    Consulted and Agree with Plan of Care Patient           Patient will benefit from skilled therapeutic intervention in order to improve the following deficits and impairments:  Abnormal gait, Decreased coordination, Decreased range of motion, Difficulty walking, Decreased endurance, Decreased safety awareness, Increased muscle spasms, Decreased balance, Decreased strength, Decreased mobility, Postural dysfunction, Improper body mechanics, Decreased scar mobility, Decreased activity tolerance, Increased fascial restricitons, Hypomobility  Visit Diagnosis: Chronic bilateral low back pain with left-sided sciatica  Sacrococcygeal disorders, not elsewhere classified  Abnormal posture  Other idiopathic scoliosis, thoracolumbar region  Urinary frequency  Pelvic pain     Problem List Patient Active Problem List   Diagnosis Date Noted   Urinary frequency 12/23/2019   Pelvic pain 12/23/2019   Low back pain 12/22/2019    Mariane Masters 02/18/2020, 10:50 AM  [place] [facility] MAIN [facility] SERVICES [address] [place], [place], [address] Phone: [phone]   Fax:  [phone]  Name: Alec Reynolds MRN: [id number] Date of Birth: September 18, 1970

## 2020-02-25 ENCOUNTER — Ambulatory Visit: Payer: BC Managed Care – PPO | Admitting: Physical Therapy

## 2020-03-03 ENCOUNTER — Other Ambulatory Visit: Payer: Self-pay

## 2020-03-03 ENCOUNTER — Ambulatory Visit: Payer: BC Managed Care – PPO | Attending: Physician Assistant | Admitting: Physical Therapy

## 2020-03-03 DIAGNOSIS — M5442 Lumbago with sciatica, left side: Secondary | ICD-10-CM | POA: Insufficient documentation

## 2020-03-03 DIAGNOSIS — R350 Frequency of micturition: Secondary | ICD-10-CM | POA: Diagnosis present

## 2020-03-03 DIAGNOSIS — R102 Pelvic and perineal pain: Secondary | ICD-10-CM

## 2020-03-03 DIAGNOSIS — M533 Sacrococcygeal disorders, not elsewhere classified: Secondary | ICD-10-CM | POA: Diagnosis present

## 2020-03-03 DIAGNOSIS — R293 Abnormal posture: Secondary | ICD-10-CM | POA: Diagnosis present

## 2020-03-03 DIAGNOSIS — M4125 Other idiopathic scoliosis, thoracolumbar region: Secondary | ICD-10-CM

## 2020-03-03 DIAGNOSIS — G8929 Other chronic pain: Secondary | ICD-10-CM

## 2020-03-03 NOTE — Patient Instructions (Signed)
Pelvic floor stretch , muscles near tailbone Frog stretch: laying on belly with pillow under hips, knees bent, inhale do nothing, exhale let ankles fall apart   20 erps   Hip stretch:   Quad stretch on your side, use towel to pull ankle towards buttocks  15 reps   ___  strengthening Multifidis twist  Band is on doorknob: stand further away from door (facing perpendicular)   Twisting trunk without moving the hips and knees Hold band at the level of ribcage, elbows bent,shoulder blades roll back and down like squeezing a pencil under armpit    Exhale twist,.10-15 deg away from door without moving your hips/ knees. Continue to maintain equal weight through legs. Keep knee unlocked.  20 reps

## 2020-03-03 NOTE — Therapy (Addendum)
[facility] MAIN [facility] SERVICES [address] [place], [place], [address] Phone: [phone]   Fax:  [phone]  Physical Therapy Treatment / Progress Note  Patient Details  Name: Alec Reynolds MRN: [id number] Date of Birth: July 03, 1970 Referring Provider (PT): Fosnight    Encounter Date: 03/03/2020   PT End of Session - 03/03/20 1822    Visit Number 7    Number of Visits 20    Date for PT Re-Evaluation 05/12/20   PN 03/02/20   PT Start Time 1404    PT Stop Time 1503    PT Time Calculation (min) 59 min    Activity Tolerance Patient tolerated treatment well           No past medical history on file.  Past Surgical History:  Procedure Laterality Date  . ELBOW SURGERY Left   . WRIST SURGERY      There were no vitals filed for this visit.   Subjective Assessment - 03/03/20 1408    Subjective Pt reported over the past week while on vacation, pt was able to sleep through the night for 6-7 hours for 5-6 days out of the week without the urge to pee. Pt also noticed a hard mattress was better for him.  Pt no longer has diarrhea as he is taking fiber and following nutritionist suggestions. Rectal pain with bowel movements.  When staning, urge to pee during the day is less intense. Urge to pee is more when sitting. Pt has the urge to pee 4 x in the morning. 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 Pt tried to wean himself off Gabapentin and experienced increased pain. Pt plans to consult MD for guidance.    Pertinent History R groin injury 2017R ,  anxiety, stress Fx in R shin, low back as kid. 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 Pt also enjoyed cycling and stopped when the pain started. Pt used to cycle 2-3 hours 200 miles / day. Pt did not have a stretch routine. Pt completed Pelvic PT via telehealth one year and tried stretching but it made it worse.    Patient Stated Goals sit down for 2  hours  without hurting, Not  have to feel like he has to pee all the time              [facility] PT Assessment - 03/03/20 1416      Coordination   Gross Motor Movements are Fluid and Coordinated --   multifidis twist with poor dissassociation of trunk /pelvis     Palpation   SI assessment  iliac crest levelled     Palpation comment tightness at coccgyeus L by coccyx, hypomobile L SIJ                          [facility] Adult PT Treatment/Exercise - 03/03/20 1457      Neuro Re-ed    Neuro Re-ed Details  cued for quad and coccyx stretch, cued for diassociation of trunk and pelvis in multifidis twist      Modalities   Modalities Moist Heat      Moist Heat Therapy   Number Minutes Moist Heat 5 Minutes    Moist Heat Location --   sacrum     Manual Therapy   Manual therapy comments AP/ rotational mob at L SIJ, STM/MWM at coccygeus to minimize  tightness at R     Kinesiotex --   nutate coccyx                      PT Long Term Goals - 02/11/20 1415      PT LONG TERM GOAL #1   Title Pt will demo decreased abdominal separation from 4 fingers width  to < 1 fingers width to improve IAP system for pelvic floor function    Time 4    Period Weeks    Status Achieved      PT LONG TERM GOAL #2   Title Pt will demo improved posture with more anterior tilt of pelvis, increased lumbar lordosis, and no cues for proper sitting/ standing posture  in order to minimize CLBP and return to more functional activities    Time 6    Period Weeks    Status Achieved      PT LONG TERM GOAL #3   Title Pt will demo no spinal deviation and more levelled pelvic girdle across 2 visits in order to advance towards deep core/ thoracolumbar strengthening exercises    Time 2    Period Weeks    Status Achieved      PT LONG TERM GOAL #4   Title Pt will demo decreased R pelvic floor mm tightness across 2 visits in order to minimize pelvic pain and straining with bowel movements and optimize  IAP system properly    Time 8    Period Weeks    Status New      PT LONG TERM GOAL #5   Title Pt's FOTO score for Pain to decrease from 29pts to < 24pts and Urinary from 38 pts to < 30 pts and Bowel from 17 pts to < 12 pts and  Bowel Leakage improve 59 pts to > 64 pts    Time 10   Ongoing     PT LONG TERM GOAL #6   Title Pt will report no urge to urinate when sitting or bending forward to put on his back pack  in order to attend meetings or leave the house    Time 8    Period Weeks    Status New    Target Date 04/07/20      PT LONG TERM GOAL #7   Title 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 Pt has achieved improved alignment of pelvic alignment with leg length difference addressed with shoe lift on RLE, less spinal deviations, less forward head posture, resolved diastasis recti and less LBP, and improved hip mobility. These are rendering improved intraabdominal pressure system and improved posture.  Today, pt required manual Tx to minimize coccygeus tightness and promote a less flexed coccyx which will help with sitting with less pain, decreasing frequency, and overactive pelvic floor mm and in turn, set the stage for more improvements with pelvic floor function. Pt continues to require biopsychosocial approaches and communication with his interdisciplinary team has been initiated. Pt remains compliant with HEP. Pt continues to benefit from skilled PT.  Examination-Activity Limitations Toileting;Sit    Stability/Clinical Decision Making Evolving/Moderate complexity    Rehab Potential Good    PT Frequency 1x / week    PT Duration Other (comment)   10   PT Treatment/Interventions Neuromuscular  re-education;Patient/family education;Therapeutic exercise;Scar mobilization;Taping;Manual techniques;Moist Heat;Therapeutic activities;Gait training;Balance training    Consulted and Agree with Plan of Care Patient           Patient will benefit from skilled therapeutic intervention in order to improve the following deficits and impairments:  Abnormal gait, Decreased coordination, Decreased range of motion, Difficulty walking, Decreased endurance, Decreased safety awareness, Increased muscle spasms, Decreased balance, Decreased strength, Decreased mobility, Postural dysfunction, Improper body mechanics, Decreased scar mobility, Decreased activity tolerance, Increased fascial restricitons, Hypomobility  Visit Diagnosis: Chronic bilateral low back pain with left-sided sciatica  Sacrococcygeal disorders, not elsewhere classified  Abnormal posture  Other idiopathic scoliosis, thoracolumbar region  Urinary frequency  Pelvic pain     Problem List Patient Active Problem List   Diagnosis Date Noted  . Urinary frequency 12/23/2019  . Pelvic pain 12/23/2019  . Low back pain 12/22/2019    Mariane Masters ,PT, DPT, E-RYT  03/03/2020, 6:24 PM  [place] [facility] MAIN [facility] SERVICES [address] [place], [place], [address] Phone: [phone]   Fax:  [phone]  Name: Alec Reynolds MRN: [id number] Date of Birth: [birth year]

## 2020-03-10 ENCOUNTER — Ambulatory Visit: Payer: BC Managed Care – PPO | Admitting: Physical Therapy

## 2020-03-17 ENCOUNTER — Ambulatory Visit: Payer: BC Managed Care – PPO | Admitting: Physical Therapy

## 2020-03-17 ENCOUNTER — Other Ambulatory Visit: Payer: Self-pay

## 2020-03-17 DIAGNOSIS — M5442 Lumbago with sciatica, left side: Secondary | ICD-10-CM | POA: Diagnosis not present

## 2020-03-17 DIAGNOSIS — R102 Pelvic and perineal pain: Secondary | ICD-10-CM

## 2020-03-17 DIAGNOSIS — R293 Abnormal posture: Secondary | ICD-10-CM

## 2020-03-17 DIAGNOSIS — M4125 Other idiopathic scoliosis, thoracolumbar region: Secondary | ICD-10-CM

## 2020-03-17 DIAGNOSIS — M533 Sacrococcygeal disorders, not elsewhere classified: Secondary | ICD-10-CM

## 2020-03-17 DIAGNOSIS — R350 Frequency of micturition: Secondary | ICD-10-CM

## 2020-03-18 NOTE — Therapy (Signed)
[facility] MAIN [facility] SERVICES [address] [place], [place], [address] Phone: [phone]   Fax:  [phone]  Physical Therapy Treatment  Patient Details  Name: Alec Reynolds MRN: [id number] Date of Birth: 1970/08/01 Referring Provider (PT): Fosnight    Encounter Date: 03/17/2020   PT End of Session - 03/18/20 1329    Visit Number 8    Number of Visits 20    Date for PT Re-Evaluation 05/12/20   PN 03/02/20   PT Start Time 1410    PT Stop Time 1510    PT Time Calculation (min) 60 min    Activity Tolerance Patient tolerated treatment well           No past medical history on file.  Past Surgical History:  Procedure Laterality Date  . ELBOW SURGERY Left   . WRIST SURGERY      There were no vitals filed for this visit.   Subjective Assessment - 03/17/20 1415    Subjective Pt has less urge to go pee and he can last longer between urine trip.  If he sits down, the peeing sensation is more urgent and frequent and there is more pain. Pt feels he is doing better with his posture. Pt felt better by 20-30% with pain and discomfort  after last session and he feels more hopeful that day. Urination frequency and burning did increase after last session. Two days after the treatment, pt felt his Sx of pain return to its previous level. The past 2 days, Sx are worse and pt noticed more stress and longer periods of standing. Pt's bowel movements are  more regular and less diarrhea. Pt is have more regular bowel movements in the morning and no straining. Pt is using [employer]. Pt feels fecal urgency while sitting right now at PT. Currently, urine stream is to the L and penis bends to the L when there is flare up with the pelvic pain. When he has more pain, he pees more.     Pertinent History R groin injury 2017R ,  anxiety, stress Fx in R shin, low back as kid. 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 Pt also enjoyed cycling and stopped when the pain started. Pt used to cycle 2-3 hours 200 miles / day. Pt did not have a stretch routine. Pt completed Pelvic PT via telehealth one year and tried stretching but it made it worse.    Patient Stated Goals sit down for 2 hours  without hurting, Not  have to feel like he has to pee all the time              [facility] PT Assessment - 03/18/20 1327      Coordination   Gross Motor Movements are Fluid and Coordinated --   no cues for pelvic tilt,      AROM   Overall AROM Comments improved hip ext and quad flexibility  in sidelying      Palpation   SI assessment  equal iliac crest, spine without deviations                       Pelvic Floor Special Questions - 03/18/20 1327    External Perineal Exam  bulbospongiosus / ischiocavernosus L > R  , coccygeus   . assessed without clothing     Coccyx no longer flexed  [facility] Adult PT Treatment/Exercise - 03/18/20 1326      Therapeutic Activites    Other Therapeutic Activities biopsychosocial approaches , active listening, boosting moral , explaining pt's improvements, approach for more down regulation of nervous system for chronic pain and minimizing tightness of pelvic floor mm       Modalities   Modalities Moist Heat      Moist Heat Therapy   Number Minutes Moist Heat 5 Minutes    Moist Heat Location --   perineum      Manual Therapy   Manual therapy comments STM/MWM at bulbospongiosus / ischiocavernosus L > R  , coccygeus                         PT Long Term Goals - 02/11/20 1415      PT LONG TERM GOAL #1   Title Pt will demo decreased abdominal separation from 4 fingers width  to < 1 fingers width to improve IAP system for pelvic floor function    Time 4    Period Weeks    Status Achieved      PT LONG TERM GOAL #2   Title Pt will demo improved posture with more anterior tilt of pelvis, increased lumbar lordosis, and  no cues for proper sitting/ standing posture  in order to minimize CLBP and return to more functional activities    Time 6    Period Weeks    Status Achieved      PT LONG TERM GOAL #3   Title Pt will demo no spinal deviation and more levelled pelvic girdle across 2 visits in order to advance towards Alec core/ thoracolumbar strengthening exercises    Time 2    Period Weeks    Status Achieved      PT LONG TERM GOAL #4   Title Pt will demo decreased R pelvic floor mm tightness across 2 visits in order to minimize pelvic pain and straining with bowel movements and optimize IAP system properly    Time 8    Period Weeks    Status New      PT LONG TERM GOAL #5   Title Pt's FOTO score for Pain to decrease from 29pts to < 24pts and Urinary from 38 pts to < 30 pts and Bowel from 17 pts to < 12 pts and  Bowel Leakage improve 59 pts to > 64 pts    Time 10      PT LONG TERM GOAL #6   Title Pt will report no urge to urinate when sitting or bending forward to put on his back pack  in order to attend meetings or leave the house    Time 8    Period Weeks    Status New    Target Date 04/07/20      PT LONG TERM GOAL #7   Title Pt 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  Pt felt better by 20-30% with pain and discomfort  after last  session which addressed his flexed coccyx and he feels more hopeful that day. Urination frequency and burning did increase after last session. Sx returned 2 days later at previous intensity level with stressful events.    Provided biopsychosocial approaches with active listening as pt reported feeling depressed and will be seeing his PCP and about his  Gabapentin medications. Provided motivation and encouragement, and explained how to focus on his improvements, and provide approaches for more down regulation of nervous system for chronic pain and minimizing tightness of pelvic floor mm, making time to rest to compliment the vigorous style of tennis playing, the demands of managing team.   Provided external manual Tx to minimize L pelvic floor which appeared more tight than R and is likely associated with his report of noticing L deviated urine stream and left bend of penis. Pt showed improved quad mm flexibility, hip extension ROM, more sacral nutation which renders the appropriateness of performing internal pelvic floor assessment and Tx at next session.   Pt's interdisciplinary team ( Pelvic PTs, nutritionist, psychotherapist)  will get on a Zoom call on Oct 4 to discuss case and optimize interdisciplinary and patient-centered care. Plan to f/u with PCP and referring provider re: his progress .  Pt continues to benefit from skilled PT     Examination-Activity Limitations Toileting;Sit    Stability/Clinical Decision Making Evolving/Moderate complexity    Rehab Potential Good    PT Frequency 1x / week    PT Duration Other (comment)   10   PT Treatment/Interventions Neuromuscular re-education;Patient/family education;Therapeutic exercise;Scar mobilization;Taping;Manual techniques;Moist Heat;Therapeutic activities;Gait training;Balance training    Consulted and Agree with Plan of Care Patient           Patient will benefit from skilled therapeutic intervention in order to improve the following deficits and impairments:  Abnormal gait, Decreased coordination, Decreased range of motion, Difficulty walking, Decreased endurance, Decreased safety awareness, Increased muscle spasms, Decreased balance, Decreased strength, Decreased mobility, Postural dysfunction, Improper body mechanics, Decreased scar mobility, Decreased activity tolerance, Increased  fascial restricitons, Hypomobility  Visit Diagnosis: Chronic bilateral low back pain with left-sided sciatica  Sacrococcygeal disorders, not elsewhere classified  Abnormal posture  Other idiopathic scoliosis, thoracolumbar region  Urinary frequency  Pelvic pain     Problem List Patient Active Problem List   Diagnosis Date Noted  . Urinary frequency 12/23/2019  . Pelvic pain 12/23/2019  . Low back pain 12/22/2019    Mariane Masters ,PT, DPT, E-RYT  03/18/2020, 1:30 PM  [facility] MAIN [facility] SERVICES [address] [place], [place], [address] Phone: [phone]   Fax:  [phone]  Name: Alec Reynolds MRN: [id number] Date of Birth: 05/05/1971

## 2020-03-24 ENCOUNTER — Other Ambulatory Visit: Payer: Self-pay

## 2020-03-24 ENCOUNTER — Ambulatory Visit: Payer: BC Managed Care – PPO | Admitting: Physical Therapy

## 2020-03-24 DIAGNOSIS — R293 Abnormal posture: Secondary | ICD-10-CM

## 2020-03-24 DIAGNOSIS — R102 Pelvic and perineal pain: Secondary | ICD-10-CM

## 2020-03-24 DIAGNOSIS — M4125 Other idiopathic scoliosis, thoracolumbar region: Secondary | ICD-10-CM

## 2020-03-24 DIAGNOSIS — G8929 Other chronic pain: Secondary | ICD-10-CM

## 2020-03-24 DIAGNOSIS — M533 Sacrococcygeal disorders, not elsewhere classified: Secondary | ICD-10-CM

## 2020-03-24 DIAGNOSIS — R350 Frequency of micturition: Secondary | ICD-10-CM

## 2020-03-24 DIAGNOSIS — M5442 Lumbago with sciatica, left side: Secondary | ICD-10-CM | POA: Diagnosis not present

## 2020-03-24 NOTE — Addendum Note (Signed)
Addended by: Mariane Masters on: 03/24/2020 12:58 PM   Modules accepted: Orders

## 2020-03-24 NOTE — Patient Instructions (Signed)
*   inhale , exhale then move the band in multifidis twist to relax the pelvic floor before you pull and this should help you have less urge

## 2020-03-25 NOTE — Therapy (Signed)
[facility] MAIN [facility] SERVICES [address] [place], [place], [address] Phone: [phone]   Fax:  [phone]  Physical Therapy Treatment  Patient Details  Name: Alec Reynolds MRN: [id number] Date of Birth: 1970/08/25 Referring Provider (PT): Fosnight    Encounter Date: 03/24/2020   PT End of Session - 03/25/20 1129    Visit Number 9    Number of Visits 20    Date for PT Re-Evaluation 05/12/20   PN 03/02/20   PT Start Time 1305    PT Stop Time 1405    PT Time Calculation (min) 60 min    Activity Tolerance Patient tolerated treatment well           No past medical history on file.  Past Surgical History:  Procedure Laterality Date   ELBOW SURGERY Left    WRIST SURGERY      There were no vitals filed for this visit.   Subjective Assessment - 03/24/20 1312    Subjective Pt felt hopeful after last session. Pt noticed relaxed penis with uriantion immediately after the session. Then the pelvic pain was more at the R side of his anus. The next day, pt returned to his normal pain level. The evening of the next , the pain subsided and he also did not have urge to pee and sitting down was more comfortable and these improvements lasted for 2 hours.  Pt notices increased urge with multifidis exercise. Pt has continued to make time for himself to relax his body and mind in them orning and night .  Pt will be seeing his PCP about medications for anxiety and depression.  When the weather gets hotter, he noticed he was getting close to a panic attack but was able to manage it.    Pertinent History R groin injury 2017R ,  anxiety, stress Fx in R shin, low back as kid. 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 Pt also enjoyed cycling and stopped when the pain started. Pt used to cycle 2-3 hours 200 miles / day. Pt did not have a stretch routine. Pt completed Pelvic PT via telehealth  one year and tried stretching but it made it worse.    Patient Stated Goals sit down for 2 hours  without hurting, Not  have to feel like he has to pee all the time              [facility] PT Assessment - 03/25/20 1132      Observation/Other Assessments   Observations upright posture, no cues for seated position       Other:   Other/ Comments multifidis exercise with incorrect breathing pattern       Palpation   Palpation comment multifidis mm bulk, no spinal deviations, pelvic girdle levelled , coccyx no longer flexed                       Pelvic Floor Special Questions - 03/25/20 1132    External Perineal Exam obt int / transverse perineal mm R  , coccygeus tightness / tenderness              [facility] Adult PT Treatment/Exercise - 03/25/20 1130      Therapeutic Activites    Other Therapeutic Activities biopsychosocial approaches, communicated about upcoming interdisciplinary team meeting, active listening about his upcoming visit to see PCP re: need for  medications for anxiety and depression, explained why incorrect breathing pattern with multifidis caused more urgency         Neuro Re-ed    Neuro Re-ed Details  cued for correct technique with multifidis exercise,       Modalities   Modalities Moist Heat      Moist Heat Therapy   Moist Heat Location --   perineum      Manual Therapy   Manual therapy comments STM/MWM at obt int / transverse perineal mm R  , coccygeus                         PT Long Term Goals - 03/24/20 1318      PT LONG TERM GOAL #1   Title Pt will demo decreased abdominal separation from 4 fingers width  to < 1 fingers width to improve IAP system for pelvic floor function    Time 4    Period Weeks    Status Achieved      PT LONG TERM GOAL #2   Title Pt will demo improved posture with more anterior tilt of pelvis, increased lumbar lordosis, and no cues for proper sitting/ standing posture  in order to minimize CLBP and return  to more functional activities    Time 6    Period Weeks    Status Achieved      PT LONG TERM GOAL #3   Title Pt will demo no spinal deviation and more levelled pelvic girdle across 2 visits in order to advance towards deep core/ thoracolumbar strengthening exercises    Time 2    Period Weeks    Status Achieved      PT LONG TERM GOAL #4   Title Pt will demo decreased R pelvic floor mm tightness across 2 visits in order to minimize pelvic pain and straining with bowel movements and optimize IAP system properly    Time 8    Period Weeks    Status New      PT LONG TERM GOAL #5   Title Pt's FOTO score for Pain to decrease from 29pts to < 24pts and Urinary from 38 pts to < 30 pts and Bowel from 17 pts to < 12 pts and  Bowel Leakage improve 59 pts to > 64 pts    Time 10      PT LONG TERM GOAL #6   Title Pt will report no urge to urinate when sitting or bending forward to put on his back pack  in order to attend meetings or leave the house    Time 8    Period Weeks    Status New      PT LONG TERM GOAL #7   Title Pt 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 Pt also demonstrates decreased mm imbalance, slumped posture, and pelvic floor mm tensions. Pt's LBP, diastasis recti and seated/ standing  posture have also improved. Pt's demonstrates better coordination of pelvic floor muscles  and ability to find anterior pelvic tilt which facilitates better urinary and bowel elimination.  Together these improvements have yielded a better intraabdominal pressure system.  Pt voiced he was very hopeful after last week's session as he noticed  much more relaxation of pelvic floor / penis with urination immediately after the session.  Pt experienced for 2 hours less urge to pee and sitting with less pain.   Pt remains compliant with HEP and remains motivated. Biopsychosocial approaches are applied with discussion on strategies for self-care and not over-working.  During the weeks pt was on vacation, pt had some improvements with Sx and pt is making positive behavior changes to take time to relax, practice mindfulness, breathing techniques to downregulate his nervous system.   Interdisciplinary update: Pt will be seeing his PCP to discuss his need for medications to manage his anxiety and depression. Pt had tried to wean himself off Gabapentin without informing his provider and was advised to f/u with his provider re: medication changes.    Pt's both Pelvic PTs, psychotherapist, nutritionist have arranged to have a zoom meeting to discuss in real time about pt's progress and how to collaborate together to continue helping pt in a coordinated way.    Pt continues to benefit from skilled PT.        Examination-Activity Limitations Toileting;Sit    Stability/Clinical Decision Making Evolving/Moderate complexity    Rehab Potential Good    PT Frequency 1x / week    PT Duration Other (comment)   10   PT Treatment/Interventions Neuromuscular re-education;Patient/family education;Therapeutic exercise;Scar mobilization;Taping;Manual techniques;Moist Heat;Therapeutic activities;Gait training;Balance training    Consulted and Agree with Plan of Care Patient           Patient will benefit from skilled therapeutic intervention in order to improve the following deficits and impairments:  Abnormal gait, Decreased coordination, Decreased range of motion, Difficulty walking, Decreased endurance, Decreased safety awareness, Increased muscle spasms, Decreased balance, Decreased strength, Decreased mobility, Postural dysfunction, Improper body  mechanics, Decreased scar mobility, Decreased activity tolerance, Increased fascial restricitons, Hypomobility  Visit Diagnosis: Sacrococcygeal disorders, not elsewhere classified  Chronic bilateral low back pain with left-sided sciatica  Abnormal posture  Other idiopathic scoliosis, thoracolumbar region  Urinary frequency  Pelvic pain     Problem List Patient Active Problem List   Diagnosis Date Noted   Urinary frequency 12/23/2019   Pelvic pain 12/23/2019   Low back pain 12/22/2019    Mariane Masters ,PT, DPT, E-RYT  03/25/2020, 11:34 AM  [place] MAIN [facility] SERVICES [address] [place], [place], [address] Phone: [phone]   Fax:  [phone]  Name: Alec Reynolds MRN: [id number] Date of Birth: 06/24/1971

## 2020-03-29 ENCOUNTER — Ambulatory Visit: Payer: BC Managed Care – PPO | Admitting: Physical Therapy

## 2020-03-31 ENCOUNTER — Encounter: Payer: BC Managed Care – PPO | Admitting: Physical Therapy

## 2020-04-05 ENCOUNTER — Encounter: Payer: BC Managed Care – PPO | Admitting: Physical Therapy

## 2020-04-07 ENCOUNTER — Encounter: Payer: BC Managed Care – PPO | Admitting: Physical Therapy

## 2020-04-07 ENCOUNTER — Other Ambulatory Visit: Payer: Self-pay

## 2020-04-07 ENCOUNTER — Ambulatory Visit: Payer: BC Managed Care – PPO | Attending: Physician Assistant | Admitting: Physical Therapy

## 2020-04-07 DIAGNOSIS — M4125 Other idiopathic scoliosis, thoracolumbar region: Secondary | ICD-10-CM

## 2020-04-07 DIAGNOSIS — M5442 Lumbago with sciatica, left side: Secondary | ICD-10-CM | POA: Diagnosis present

## 2020-04-07 DIAGNOSIS — R350 Frequency of micturition: Secondary | ICD-10-CM | POA: Diagnosis present

## 2020-04-07 DIAGNOSIS — R293 Abnormal posture: Secondary | ICD-10-CM | POA: Diagnosis present

## 2020-04-07 DIAGNOSIS — G8929 Other chronic pain: Secondary | ICD-10-CM | POA: Insufficient documentation

## 2020-04-07 DIAGNOSIS — R102 Pelvic and perineal pain: Secondary | ICD-10-CM | POA: Diagnosis present

## 2020-04-07 DIAGNOSIS — M533 Sacrococcygeal disorders, not elsewhere classified: Secondary | ICD-10-CM | POA: Insufficient documentation

## 2020-04-07 NOTE — Patient Instructions (Signed)
  Clam Shell 45 Degrees  Lying with hips and knees bent 45, one pillow between knees and ankles. Heel together, toes apart like ballerina,  Lift knee with exhale while pressing heels together. Be sure pelvis does not roll backward. Do not arch back. Do 20 times, each leg, 2 times per day.     Complimentary stretch: Figure-4  3 breaths  * Keep pelvis levelled with tactile cue with hand under back of hips  * Slide the ankle of the supporting foot out to decrease the angle which can help level the pelvis

## 2020-04-08 NOTE — Therapy (Addendum)
[facility] MAIN [facility] SERVICES [address] [place], [place], [address] Phone: [phone]   Fax:  [phone]  Physical Therapy Treatment  Patient Details  Name: Alec Reynolds MRN: [id number] Date of Birth: 09-Aug-1970 Referring Provider (PT): Fosnight    Encounter Date: 04/07/2020   PT End of Session - 04/08/20 1121    Visit Number 10    Number of Visits 20    Date for PT Re-Evaluation 05/12/20   PN 03/02/20   PT Start Time 1415    PT Stop Time 1300    PT Time Calculation (min) 1365 min    Activity Tolerance Patient tolerated treatment well           No past medical history on file.  Past Surgical History:  Procedure Laterality Date  . ELBOW SURGERY Left   . WRIST SURGERY      There were no vitals filed for this visit.   Subjective Assessment - 04/07/20 1415    Subjective Pt had a couple of good moments when he was going better for a couple of hours .  Pt is working on listening to mindfulness materials his psychotherapist recommended.  Pt's digestive system is better, bowel movements are better. Pt notices pain deep layer of mm at the base of the penis when sittign on a bar stool/  hard surface. Pt noticed for 3 nights in a row after last 2 sessions, pt experienced erections.  After the last two appts, pt noticed his penis is extremely soft and relaxed. After time passes, the penis gets firm and moves to the left.    Pertinent History R groin injury 2017R ,  anxiety, stress Fx in R shin, low back as kid. 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 Pt also enjoyed cycling and stopped when the pain started. Pt used to cycle 2-3 hours 200 miles / day. Pt did not have a stretch routine. Pt completed Pelvic PT via telehealth one year and tried stretching but it made it worse.    Patient Stated Goals sit down for 2 hours  without hurting, Not  have to feel like he has to pee  all the time                          Pelvic Floor Special Questions - 04/07/20 1454    Pelvic Floor Internal Exam pt had no contraindications, pt consented to rectal assessment    Exam Type Rectal    Palpation R sidelying, tenderness 7- 1 o'clock, tightness at pirfiformis/ ATLA  L              [facility] Adult PT Treatment/Exercise - 04/07/20 1456      Therapeutic Activites    Other Therapeutic Activities active listening, positive reinforcement of his progress, educated on posture in sidelying and pillow under head to minimize forward head        Neuro Re-ed    Neuro Re-ed Details  cued for pelvic tilts and hip abd during internal treatment,       Manual Therapy   Internal Pelvic Floor STM/MWM at problem areas noted in assessment                        PT Long Term Goals - 03/24/20 1318      PT LONG TERM GOAL #  1   Title Pt will demo decreased abdominal separation from 4 fingers width  to < 1 fingers width to improve IAP system for pelvic floor function    Time 4    Period Weeks    Status Achieved      PT LONG TERM GOAL #2   Title Pt will demo improved posture with more anterior tilt of pelvis, increased lumbar lordosis, and no cues for proper sitting/ standing posture  in order to minimize CLBP and return to more functional activities    Time 6    Period Weeks    Status Achieved      PT LONG TERM GOAL #3   Title Pt will demo no spinal deviation and more levelled pelvic girdle across 2 visits in order to advance towards deep core/ thoracolumbar strengthening exercises    Time 2    Period Weeks    Status Achieved      PT LONG TERM GOAL #4   Title Pt will demo decreased R pelvic floor mm tightness across 2 visits in order to minimize pelvic pain and straining with bowel movements and optimize IAP system properly    Time 8    Period Weeks    Status New      PT LONG TERM GOAL #5   Title Pt's FOTO score for Pain to decrease from 29pts to <  24pts and Urinary from 38 pts to < 30 pts and Bowel from 17 pts to < 12 pts and  Bowel Leakage improve 59 pts to > 64 pts    Time 10      PT LONG TERM GOAL #6   Title Pt will report no urge to urinate when sitting or bending forward to put on his back pack  in order to attend meetings or leave the house    Time 8    Period Weeks    Status New      PT LONG TERM GOAL #7   Title Pt 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 Pt showed decreased tenderness and tightness postTx. Pt showed hip abduction strength weakness and thus, was given strengthening HEP which will help decrease pelvic floor overactivity. Plan to address postural stability and thoracolumbar / lower kinetic chain strengthening to help with pt's standing endurance which is needed for his job as a Engineer, manufacturing systems. Pt continues to benefit from skilled PT    Examination-Activity Limitations Toileting;Sit    Stability/Clinical Decision Making Evolving/Moderate complexity    Rehab Potential Good    PT Frequency 1x / week    PT Duration Other (comment)   10   PT Treatment/Interventions Neuromuscular re-education;Patient/family education;Therapeutic exercise;Scar mobilization;Taping;Manual techniques;Moist Heat;Therapeutic activities;Gait training;Balance training    Consulted and Agree with Plan of Care Patient           Patient will benefit from skilled therapeutic intervention in order to improve the following deficits and impairments:  Abnormal gait, Decreased coordination, Decreased range of motion, Difficulty walking, Decreased endurance, Decreased safety awareness, Increased muscle spasms, Decreased  balance, Decreased strength, Decreased mobility, Postural dysfunction, Improper body mechanics, Decreased scar mobility, Decreased activity tolerance, Increased fascial restricitons, Hypomobility  Visit Diagnosis: Sacrococcygeal disorders, not elsewhere classified  Chronic bilateral low back pain with left-sided sciatica  Abnormal posture  Other idiopathic scoliosis, thoracolumbar region  Urinary frequency  Pelvic pain     Problem List Patient Active Problem List   Diagnosis Date Noted  . Urinary frequency 12/23/2019  . Pelvic pain 12/23/2019  . Low back pain 12/22/2019    Mariane Masters ,PT, DPT, E-RYT  04/08/2020, 11:23 AM  [facility] MAIN [facility] SERVICES [address] [place], [place], [address] Phone: [phone]   Fax:  [phone]  Name: Burlin Mcnair MRN: [id number] Date of Birth: January 21, 1971

## 2020-04-14 ENCOUNTER — Ambulatory Visit: Payer: BC Managed Care – PPO | Admitting: Physical Therapy

## 2020-04-14 ENCOUNTER — Other Ambulatory Visit: Payer: Self-pay

## 2020-04-14 DIAGNOSIS — M533 Sacrococcygeal disorders, not elsewhere classified: Secondary | ICD-10-CM | POA: Diagnosis not present

## 2020-04-14 DIAGNOSIS — R102 Pelvic and perineal pain: Secondary | ICD-10-CM

## 2020-04-14 DIAGNOSIS — R293 Abnormal posture: Secondary | ICD-10-CM

## 2020-04-14 DIAGNOSIS — R350 Frequency of micturition: Secondary | ICD-10-CM

## 2020-04-14 DIAGNOSIS — G8929 Other chronic pain: Secondary | ICD-10-CM

## 2020-04-14 DIAGNOSIS — M4125 Other idiopathic scoliosis, thoracolumbar region: Secondary | ICD-10-CM

## 2020-04-14 NOTE — Therapy (Signed)
[facility] MAIN [facility] SERVICES [address] [place], [place], [address] Phone: [phone]   Fax:  [phone]  Physical Therapy Treatment  Patient Details  Name: Bryon Cuttriss-Curtis MRN: [id number] Date of Birth: 16-Aug-1970 Referring Provider (PT): Fosnight    Encounter Date: 04/14/2020   PT End of Session - 04/14/20 1321    Visit Number 11    Number of Visits 20    Date for PT Re-Evaluation 05/12/20   PN 03/02/20   PT Start Time 1315    PT Stop Time 1425    PT Time Calculation (min) 70 min    Activity Tolerance Patient tolerated treatment well           No past medical history on file.  Past Surgical History:  Procedure Laterality Date  . ELBOW SURGERY Left   . WRIST SURGERY      There were no vitals filed for this visit.   Subjective Assessment - 04/14/20 1318    Subjective Pt reported the point of tighntess by the L anus went away last week for 5 days. Improvements the past week: more comfortable sitting in car on the way home, Urge to pee in the morning nand walking the dogs was much less. Able to sit on metal and concrete slabs at [employer].    Pertinent History R groin injury 2017R ,  anxiety, stress Fx in R shin, low back as kid. 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 Pt also enjoyed cycling and stopped when the pain started. Pt used to cycle 2-3 hours 200 miles / day. Pt did not have a stretch routine. Pt completed Pelvic PT via telehealth one year and tried stretching but it made it worse.    Patient Stated Goals sit down for 2 hours  without hurting, Not  have to feel like he has to pee all the time              [facility] PT Assessment - 04/14/20 1405      Observation/Other Assessments   Observations medial collpase with eccentric control of PF       Coordination   Gross Motor Movements are Fluid and Coordinated --   less cues for pelvic tilts       Palpation   Palpation comment adductor R. femoral triangle R                       Pelvic Floor Special Questions - 04/14/20 1403    Pelvic Floor Internal Exam pt had no contraindications, pt consented to rectal assessment    Exam Type Rectal    Palpation R sidelying tightness at 7 clock sacrococcgeus ligament L, 5 oclock at ischial rami connections              [facility] Adult PT Treatment/Exercise - 04/14/20 1405      Therapeutic Activites    Other Therapeutic Activities explained mechanics, alignment, physiology, anatomy of areas of pelvic floor mm tightness, and role of tennis and having a shorter leg with overused muscles tightness       Neuro Re-ed    Neuro Re-ed Details  cued for eccentric control of lower kinetic chain in resistance band walking with propioception of calcanus lateral aspect to minimize medial collpase       Moist Heat Therapy   Number Minutes Moist Heat 5 Minutes  Moist Heat Location --   perineum/ adductor      Manual Therapy   Internal Pelvic Floor STM/MWM at problem areas noted in assessment                        PT Long Term Goals - 03/24/20 1318      PT LONG TERM GOAL #1   Title Pt will demo decreased abdominal separation from 4 fingers width  to < 1 fingers width to improve IAP system for pelvic floor function    Time 4    Period Weeks    Status Achieved      PT LONG TERM GOAL #2   Title Pt will demo improved posture with more anterior tilt of pelvis, increased lumbar lordosis, and no cues for proper sitting/ standing posture  in order to minimize CLBP and return to more functional activities    Time 6    Period Weeks    Status Achieved      PT LONG TERM GOAL #3   Title Pt will demo no spinal deviation and more levelled pelvic girdle across 2 visits in order to advance towards deep core/ thoracolumbar strengthening exercises    Time 2    Period Weeks    Status Achieved      PT LONG TERM GOAL #4   Title Pt will  demo decreased R pelvic floor mm tightness across 2 visits in order to minimize pelvic pain and straining with bowel movements and optimize IAP system properly    Time 8    Period Weeks    Status New      PT LONG TERM GOAL #5   Title Pt's FOTO score for Pain to decrease from 29pts to < 24pts and Urinary from 38 pts to < 30 pts and Bowel from 17 pts to < 12 pts and  Bowel Leakage improve 59 pts to > 64 pts    Time 10      PT LONG TERM GOAL #6   Title Pt will report no urge to urinate when sitting or bending forward to put on his back pack  in order to attend meetings or leave the house    Time 8    Period Weeks    Status New      PT LONG TERM GOAL #7   Title Pt 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 The point of tighntess by the L anus went away last week for 5 days.  Pt's second rectal assessment today showed less pelvic floor tenderness. Following Tx, pt had less tightness at mm attachments to ischial rami.  Pt required further manual Tx to release adductor tightness which is his short leg ( shoe lift currently is providing equal alignment of pelvis).   Cued for less chest breathing and more lengthening of pelvic floor with diaphragmatic breathing.   Lower kinetic chain assessment showed medial collapse of arches which is associated with overactivity of pelvic floor.   Progressed pt upright exercises with cues to  lower heel  slowly and place outer heel with more weight to minimize medial collpase of plantar arches.   Pt was explained mechanics, alignment, physiology, anatomy of areas of pelvic floor mm tightness, and role of tennis and having a shorter leg with overused muscles  tightness.   Pt continues to benefit from skilled PT.      Examination-Activity Limitations Toileting;Sit    Stability/Clinical Decision Making Evolving/Moderate complexity    Rehab Potential Good    PT Frequency 1x / week    PT Duration Other (comment)   10   PT Treatment/Interventions Neuromuscular re-education;Patient/family education;Therapeutic exercise;Scar mobilization;Taping;Manual techniques;Moist Heat;Therapeutic activities;Gait training;Balance training    Consulted and Agree with Plan of Care Patient           Patient will benefit from skilled therapeutic intervention in order to improve the following deficits and impairments:  Abnormal gait, Decreased coordination, Decreased range of motion, Difficulty walking, Decreased endurance, Decreased safety awareness, Increased muscle spasms, Decreased balance, Decreased strength, Decreased mobility, Postural dysfunction, Improper body mechanics, Decreased scar mobility, Decreased activity tolerance, Increased fascial restricitons, Hypomobility  Visit Diagnosis: Sacrococcygeal disorders, not elsewhere classified  Chronic bilateral low back pain with left-sided sciatica  Abnormal posture  Other idiopathic scoliosis, thoracolumbar region  Urinary frequency  Pelvic pain     Problem List Patient Active Problem List   Diagnosis Date Noted  . Urinary frequency 12/23/2019  . Pelvic pain 12/23/2019  . Low back pain 12/22/2019    Mariane Masters ,PT, DPT, E-RYT  04/14/2020, 2:26 PM  [facility] MAIN [facility] SERVICES [address] [place], [place], [address] Phone: [phone]   Fax:  [phone]  Name: Nuri Larmer MRN: [id number] Date of Birth: April 20, 1971

## 2020-04-14 NOTE — Patient Instructions (Signed)
   WALKING WITH RESISTANCE BLUE Band at waist connected to doorknob Hold band behind hips shoulder down and chin tuck,elbows straight  Stepping forward normal length steps, planting mid and forefoot down, center of mass ( navel) leans forward slightly as if you were walking uphill 3-4 steps till band feels taut ( MAKE SURE THE DOOR IS LOCKED AND WON'T OPEN)   Stepping backwards, lower heel slowly and place outer heel with more weight to minimize medial collpase of planta arches , carry trunk and hips back as you step

## 2020-04-19 ENCOUNTER — Encounter: Payer: BC Managed Care – PPO | Admitting: Physical Therapy

## 2020-04-21 ENCOUNTER — Encounter: Payer: BC Managed Care – PPO | Admitting: Physical Therapy

## 2020-04-21 ENCOUNTER — Ambulatory Visit: Payer: BC Managed Care – PPO | Admitting: Physical Therapy

## 2020-04-28 ENCOUNTER — Ambulatory Visit: Payer: BC Managed Care – PPO | Admitting: Physical Therapy

## 2020-05-03 ENCOUNTER — Encounter: Payer: BC Managed Care – PPO | Admitting: Physical Therapy

## 2020-05-05 ENCOUNTER — Other Ambulatory Visit: Payer: Self-pay

## 2020-05-05 ENCOUNTER — Encounter: Payer: BC Managed Care – PPO | Admitting: Physical Therapy

## 2020-05-05 ENCOUNTER — Ambulatory Visit: Payer: BC Managed Care – PPO | Attending: Physician Assistant | Admitting: Physical Therapy

## 2020-05-05 DIAGNOSIS — R102 Pelvic and perineal pain: Secondary | ICD-10-CM | POA: Diagnosis present

## 2020-05-05 DIAGNOSIS — R293 Abnormal posture: Secondary | ICD-10-CM

## 2020-05-05 DIAGNOSIS — M533 Sacrococcygeal disorders, not elsewhere classified: Secondary | ICD-10-CM | POA: Diagnosis not present

## 2020-05-05 DIAGNOSIS — M4125 Other idiopathic scoliosis, thoracolumbar region: Secondary | ICD-10-CM | POA: Diagnosis present

## 2020-05-05 DIAGNOSIS — R350 Frequency of micturition: Secondary | ICD-10-CM | POA: Diagnosis present

## 2020-05-05 DIAGNOSIS — G8929 Other chronic pain: Secondary | ICD-10-CM | POA: Insufficient documentation

## 2020-05-05 DIAGNOSIS — M5442 Lumbago with sciatica, left side: Secondary | ICD-10-CM | POA: Insufficient documentation

## 2020-05-05 NOTE — Patient Instructions (Signed)
Swimmers  Shoulders and arm in a V  Pillow under belly to avoid sway back   Inhale, exhale, raise one arm up ( half V" ) hug armpit down, opp lthig up, knee straight 30 deg off table   10 reps  X 2

## 2020-05-06 NOTE — Therapy (Signed)
[facility] [facility] MAIN [facility] SERVICES [address] [place], [place], [address] Phone: [phone]   Fax:  [phone]  Physical Therapy Treatment  Patient Details  Name: Alec Reynolds MRN: [id number] Date of Birth: 12-30-1970 Referring Provider (PT): Fosnight    Encounter Date: 05/05/2020   PT End of Session - 05/05/20 1414    Visit Number 12    Number of Visits 20    Date for PT Re-Evaluation 05/12/20   PN 03/02/20   PT Start Time 1400    PT Stop Time 1500    PT Time Calculation (min) 60 min    Activity Tolerance Patient tolerated treatment well           No past medical history on file.  Past Surgical History:  Procedure Laterality Date   ELBOW SURGERY Left    WRIST SURGERY      There were no vitals filed for this visit.   Subjective Assessment - 05/05/20 1407    Subjective Pt reported positive changes to the pelvic area and was able to increase sitting with more comfort but the pain has returned at 60% intensity instead of 100% intensity.  Pt noticed sharp pains in the L part of his penis and it aggravated the pain he had at 6-7/10.  Pt is generally doing better with bowel movemetns. The pain after bowelm ovements has changed to discomfort.  Pt still always has to pee after a bowel movement.  Cold weather increased pain and urgency which was the pattern last fall/winter.    Pertinent History R groin injury 2017R ,  anxiety, stress Fx in R shin, low back as kid. 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 Pt also enjoyed cycling and stopped when the pain started. Pt used to cycle 2-3 hours 200 miles / day. Pt did not have a stretch routine. Pt completed Pelvic PT via telehealth one year and tried stretching but it made it worse.    Patient Stated Goals sit down for 2 hours  without hurting, Not  have to feel like he has to pee all the time                           Pelvic Floor Special Questions - 05/06/20 1023    External Perineal Exam Without clothing     External Palpation tightness at ischiocavernous/ deep transverse perineal L     Pelvic Floor Internal Exam pt had no contraindications, pt consented to rectal assessment    Exam Type Rectal    Palpation R sidelying tightness at L pubic rami, ATLA,  ( pressure here with referred pain to R )               [facility] Adult PT Treatment/Exercise - 05/06/20 1025      Neuro Re-ed    Neuro Re-ed Details  cued for new thoracolumbar/ glut strengtehning with modification to minimzie lumbar lordosis and minmal activity of posterior pelvic floor for glut strengthening       Moist Heat Therapy   Number Minutes Moist Heat 5 Minutes    Moist Heat Location --   perineum      Manual Therapy   Manual therapy comments STM/MWM at problem areas noted in assessment     Internal Pelvic Floor MWM/STM at problem areas noted in assessment  PT Long Term Goals - 03/24/20 1318      PT LONG TERM GOAL #1   Title Pt will demo decreased abdominal separation from 4 fingers width  to < 1 fingers width to improve IAP system for pelvic floor function    Time 4    Period Weeks    Status Achieved      PT LONG TERM GOAL #2   Title Pt will demo improved posture with more anterior tilt of pelvis, increased lumbar lordosis, and no cues for proper sitting/ standing posture  in order to minimize CLBP and return to more functional activities    Time 6    Period Weeks    Status Achieved      PT LONG TERM GOAL #3   Title Pt will demo no spinal deviation and more levelled pelvic girdle across 2 visits in order to advance towards deep core/ thoracolumbar strengthening exercises    Time 2    Period Weeks    Status Achieved      PT LONG TERM GOAL #4   Title Pt will demo decreased R pelvic floor mm tightness across 2 visits in order to minimize pelvic pain and  straining with bowel movements and optimize IAP system properly    Time 8    Period Weeks    Status New      PT LONG TERM GOAL #5   Title Pt's FOTO score for Pain to decrease from 29pts to < 24pts and Urinary from 38 pts to < 30 pts and Bowel from 17 pts to < 12 pts and  Bowel Leakage improve 59 pts to > 64 pts    Time 10      PT LONG TERM GOAL #6   Title Pt will report no urge to urinate when sitting or bending forward to put on his back pack  in order to attend meetings or leave the house    Time 8    Period Weeks    Status New      PT LONG TERM GOAL #7   Title 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  Pt was progressed to thoracolumbar/ glut strengtehning in prone position with pillow under abdomen to minimize lumbar lordosis and minimize overactivity of glut/ posterior pelvic floor mm.  Withholding bridging exercises for glut strengthening until pelvic floor mm maintain mobility and minimal tensions. Pt continues to benefit from skilled PT.      Examination-Activity Limitations Toileting;Sit    Stability/Clinical Decision Making Evolving/Moderate complexity    Rehab Potential Good    PT Frequency 1x / week    PT Duration Other (comment)   10   PT Treatment/Interventions Neuromuscular re-education;Patient/family education;Therapeutic exercise;Scar mobilization;Taping;Manual techniques;Moist Heat;Therapeutic activities;Gait training;Balance training    Consulted and Agree with Plan of Care Patient           Patient will benefit from skilled therapeutic intervention in order to improve the following deficits and impairments:  Abnormal gait, Decreased coordination, Decreased range of motion, Difficulty  walking, Decreased endurance, Decreased safety awareness, Increased muscle spasms, Decreased  balance, Decreased strength, Decreased mobility, Postural dysfunction, Improper body mechanics, Decreased scar mobility, Decreased activity tolerance, Increased fascial restricitons, Hypomobility  Visit Diagnosis: Sacrococcygeal disorders, not elsewhere classified  Chronic bilateral low back pain with left-sided sciatica  Abnormal posture  Other idiopathic scoliosis, thoracolumbar region  Urinary frequency  Pelvic pain     Problem List Patient Active Problem List   Diagnosis Date Noted   Urinary frequency 12/23/2019   Pelvic pain 12/23/2019   Low back pain 12/22/2019    Mariane Masters ,PT, DPT, E-RYT  05/06/2020, 10:28 AM  [facility] MAIN [facility] SERVICES [address] [place], [place], [address] Phone: [phone]   Fax:  [phone]  Name: Alec Reynolds MRN: [id number] Date of Birth: 1970/10/22

## 2020-05-09 ENCOUNTER — Other Ambulatory Visit: Payer: Self-pay

## 2020-05-09 ENCOUNTER — Ambulatory Visit: Payer: BC Managed Care – PPO | Admitting: Physical Therapy

## 2020-05-09 DIAGNOSIS — R293 Abnormal posture: Secondary | ICD-10-CM

## 2020-05-09 DIAGNOSIS — R350 Frequency of micturition: Secondary | ICD-10-CM

## 2020-05-09 DIAGNOSIS — R102 Pelvic and perineal pain: Secondary | ICD-10-CM

## 2020-05-09 DIAGNOSIS — M4125 Other idiopathic scoliosis, thoracolumbar region: Secondary | ICD-10-CM

## 2020-05-09 DIAGNOSIS — G8929 Other chronic pain: Secondary | ICD-10-CM

## 2020-05-09 DIAGNOSIS — M533 Sacrococcygeal disorders, not elsewhere classified: Secondary | ICD-10-CM

## 2020-05-09 NOTE — Therapy (Addendum)
[place] MAIN [facility] SERVICES [address] [place], [place], [address] Phone: [phone]   Fax:  [phone]  Physical Therapy Treatment/ Progress Note   Patient Details  Name: Alec Reynolds MRN: [id number] Date of Birth: 1970/11/22 Referring Provider (PT): Fosnight    Encounter Date: 05/09/2020   PT End of Session - 05/09/20 1527    Visit Number 13    Number of Visits 20    Date for PT Re-Evaluation 05/12/20   PN 03/02/20   PT Start Time 1300    PT Stop Time 1400    PT Time Calculation (min) 60 min    Activity Tolerance Patient tolerated treatment well           No past medical history on file.  Past Surgical History:  Procedure Laterality Date  . ELBOW SURGERY Left   . WRIST SURGERY      There were no vitals filed for this visit.   Subjective Assessment - 05/09/20 1510    Subjective Pt noticed cold weather causes more urgency.  Leaning forward ~30 in flexion, bending over to picking up tennis balls, he feels the urge to pee. Improvements from last week, pt noticed mm around the L penis and rectal mm more relaxed after the internal Tx last week. Pt was able to relax on his couch which is an improvement. Pt noticed localized pain at the L ischial rami ( pt to anatomical model) during the treatment and it is the location of his issues for many years. When that area is worse , his stomach is worse and brings on diarrhea.     Pertinent History R groin injury 2017R ,  anxiety, stress Fx in R shin, low back as kid. 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 Pt also enjoyed cycling and stopped when the pain started. Pt used to cycle 2-3 hours 200 miles / day. Pt did not have a stretch routine. Pt completed Pelvic PT via telehealth one year and tried stretching but it made it worse.    Patient Stated Goals sit down for 2 hours  without hurting, Not  have to feel like he has  to pee all the time              [facility] PT Assessment - 05/09/20 1743      Lunges   Comments poor propioception of knee above ankle,  lumbarlordosis       Palpation   SI assessment  tightness/ hypomobility at L SIJ, sacrum limited nutation, tightness at glut med                          [facility] Adult PT Treatment/Exercise - 05/09/20 1555      Therapeutic Activites    Other Therapeutic Activities explained with anatomy/ physiology pictures and mechanics       Neuro Re-ed    Neuro Re-ed Details  cued for progression to thoracolumbar / glut CKC version, new L SIJ stretch       Moist Heat Therapy   Number Minutes Moist Heat 5 Minutes    Moist Heat Location --   sacrum/ L SIJ during adminstration of FOTO      Manual Therapy   Manual therapy comments superior glide at L SIJ, promote nutation of L sacrum, STM glut med  PT Long Term Goals - 05/09/20 1528      PT LONG TERM GOAL #1   Title Pt will demo decreased abdominal separation from 4 fingers width  to < 1 fingers width to improve IAP system for pelvic floor function    Time 4    Period Weeks    Status Achieved      PT LONG TERM GOAL #2   Title Pt will demo improved posture with more anterior tilt of pelvis, increased lumbar lordosis, and no cues for proper sitting/ standing posture  in order to minimize CLBP and return to more functional activities    Time 6    Period Weeks    Status Achieved      PT LONG TERM GOAL #3   Title Pt will demo no spinal deviation and more levelled pelvic girdle across 2 visits in order to advance towards deep core/ thoracolumbar strengthening exercises    Time 2    Period Weeks    Status Achieved      PT LONG TERM GOAL #4   Title Pt will demo decreased R pelvic floor mm tightness across 2 visits in order to minimize pelvic pain and straining with bowel movements and optimize IAP system properly    Time 8    Period Weeks    Status Achieved       PT LONG TERM GOAL #5   Title Pt's FOTO score for Pain to decrease from 29pts to < 24pts and Urinary from 38 pts to < 30 pts and Bowel from 17 pts to < 12 pts and  Bowel Leakage improve 59 pts to > 64 pts    Baseline 11/8:Urinary  38 pts to 33 pts, PFDI Bowel 17pts to 4pts, bowel Leakage 59pts to 72 pts    Time 10    Period Weeks    Status Partially Met      Additional Long Term Goals   Additional Long Term Goals Yes      PT LONG TERM GOAL #6   Title Pt will report no urge to urinate when sitting or bending forward to put on his back pack  in order to attend meetings or leave the house    Time 8    Period Weeks    Status On-going      PT LONG TERM GOAL #7   Title Pt 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 and due to leg length difference -diastasis recti improved with more approximation of rectus abdominis mm which positively impacts  intraabdominal pressure system -increased multifidis and deep core mm strength -decreased pelvic floor mm / adductor mm tightness -increased propioception and mobility to perform anterior tilts of pelvis to optimize pelvic floor lengthening -restored alignment of coccyx ( no longer deviated)   Currently working on improving:  -L SIJ mobility and addressing localized pain at L ischial rami and along path of pudendal nerve to promote less urge with bending forward -addressing low plantar arches/ medial collapse of feet, increasing endurance of posterior chain of lower kinetic chain to promote longer standing endurance at work and less overactivity of pelvic floor  -improving urgency, frequency, remaining area of pelvic pain  Pt remains compliant to biopsychosocial approaches and utilizes relaxation practices he has learned from pelvic PT sessions and his psychotherapy sessions. A multidisciplinary team meeting with Pelvic PT,  his other Pelvic PT, nutritionist, and psychotherapist was coordinated in October and they  discussed / co-created strategies to continue providing patient-centered care and reinforcing his motivation/ compliance to programs offered within each disciplines. Pt reports being able to better manage his anxiety related to his Sx.   Today, pt was explained with anatomy images about pudendal nerve pathway to help pt understand the MSK causes to remaining areas of pain located at ischial rami region on L. Advanced pt to CKC exercise to promote thoracolumbar/ glut strengthening. Pt required proper technique and propioception of knee and feet .   Pt continues to benefit from skilled PT. Anticipate will achieve remaining goals.      Examination-Activity Limitations Toileting;Sit    Stability/Clinical Decision Making Evolving/Moderate complexity    Rehab Potential Good    PT Frequency 1x / week    PT Duration Other (comment)   10   PT Treatment/Interventions Neuromuscular  re-education;Patient/family education;Therapeutic exercise;Scar mobilization;Taping;Manual techniques;Moist Heat;Therapeutic activities;Gait training;Balance training    Consulted and Agree with Plan of Care Patient           Patient will benefit from skilled therapeutic intervention in order to improve the following deficits and impairments:  Abnormal gait, Decreased coordination, Decreased range of motion, Difficulty walking, Decreased endurance, Decreased safety awareness, Increased muscle spasms, Decreased balance, Decreased strength, Decreased mobility, Postural dysfunction, Improper body mechanics, Decreased scar mobility, Decreased activity tolerance, Increased fascial restricitons, Hypomobility  Visit Diagnosis: Sacrococcygeal disorders, not elsewhere classified -   Chronic bilateral low back pain with left-sided sciatica -   Abnormal posture -   Other idiopathic scoliosis, thoracolumbar region -   Urinary frequency   Pelvic pain -     Problem List Patient Active Problem List   Diagnosis Date Noted  . Urinary frequency 12/23/2019  . Pelvic pain 12/23/2019  . Low back pain 12/22/2019    Jerl Mina ,PT, DPT, E-RYT  05/10/2020, 12:19 PM  [place] MAIN [facility] SERVICES [address] [place], [place], [address] Phone: [phone]   Fax:  [phone]  Name: Wen Merced MRN: [id number] Date of Birth: [birth year]

## 2020-05-09 NOTE — Patient Instructions (Addendum)
       THORACOLUMBAR and Glut  strengthening   Discontinue the swimmers on your belly Progress to standing ( front knee above ankle, back ballmounds press into earth to spring up),. Back foot not too far back , not a full lunge)  Opp arm in half "V"  2 mins    __  L sacral stretch Scoot hips to L,  Strap/ towel under L thigh, cross L thigh over R, pull with R hand, shoulders/ elbows resting  10 reps knee moves in R armpit direction

## 2020-05-10 NOTE — Addendum Note (Signed)
Addended by: Mariane Masters on: 05/10/2020 12:37 PM   Modules accepted: Orders

## 2020-05-12 ENCOUNTER — Encounter: Payer: BC Managed Care – PPO | Admitting: Physical Therapy

## 2020-05-17 ENCOUNTER — Other Ambulatory Visit: Payer: Self-pay

## 2020-05-17 ENCOUNTER — Ambulatory Visit: Payer: BC Managed Care – PPO | Admitting: Physical Therapy

## 2020-05-17 DIAGNOSIS — M533 Sacrococcygeal disorders, not elsewhere classified: Secondary | ICD-10-CM

## 2020-05-17 DIAGNOSIS — R293 Abnormal posture: Secondary | ICD-10-CM

## 2020-05-17 DIAGNOSIS — R350 Frequency of micturition: Secondary | ICD-10-CM

## 2020-05-17 DIAGNOSIS — R102 Pelvic and perineal pain: Secondary | ICD-10-CM

## 2020-05-17 DIAGNOSIS — M4125 Other idiopathic scoliosis, thoracolumbar region: Secondary | ICD-10-CM

## 2020-05-17 DIAGNOSIS — G8929 Other chronic pain: Secondary | ICD-10-CM

## 2020-05-17 NOTE — Therapy (Signed)
[place] MAIN [facility] SERVICES [address] [place], [place], [address] Phone: [phone]   Fax:  [phone]  Physical Therapy Treatment  Patient Details  Name: Alec Reynolds MRN: [id number] Date of Birth: [birth year] Referring Provider (PT): Fosnight    Encounter Date: 05/17/2020   PT End of Session - 05/17/20 1831    Visit Number 14    Number of Visits --    Date for PT Re-Evaluation 07/19/20   PN 05/09/20   PT Start Time 1300    PT Stop Time 1430    PT Time Calculation (min) 90 min    Activity Tolerance Patient tolerated treatment well;No increased pain    Behavior During Therapy [facility] for tasks assessed/performed           No past medical history on file.  Past Surgical History:  Procedure Laterality Date  . ELBOW SURGERY Left   . WRIST SURGERY      There were no vitals filed for this visit.   Subjective Assessment - 05/17/20 1305    Subjective Pt reported he went to visit the referring provider 9 Dr. Jay Schlichter)  and pelvic PT ( Dr. Rocky Link)  in [place]. Pt was offered trigger point injections, dry needling and pt wants to know if these Tx will be good with current Tx.  Pt had pelvic Tx with Pelvic PT in [place] that offered relief which lasted 2-3 hours but there was stinging in the anal area but the other area by the low L side of penis and buttock was relieved.    Pertinent History R groin injury 2017R ,  anxiety, stress Fx in R shin, low back as kid. 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 Pt also enjoyed cycling and stopped when the pain started. Pt used to cycle 2-3 hours 200 miles / day. Pt did not have a stretch routine. Pt completed Pelvic PT via telehealth one year and tried stretching but it made it worse.    Patient Stated Goals sit down for 2 hours  without hurting, Not  have to feel like he has to pee all the time              [facility] PT  Assessment - 05/17/20 1836      Squat   Comments medial collapse of arches                       Pelvic Floor Special Questions - 05/17/20 1836    Pelvic Floor Internal Exam pt had no contraindications, pt consented to rectal assessment    Exam Type Rectal    Palpation R sidelying tightness at L : MWM at pubic rami L, iliococcgyeus L tightness              [facility] Adult PT Treatment/Exercise - 05/17/20 1832      Therapeutic Activites    Other Therapeutic Activities active listening, motivational interviewing, biopsychosocial approaches to boost morale, provide encouragement       Neuro Re-ed    Neuro Re-ed Details  cued for mini squat / relaxation       Moist Heat Therapy   Number Minutes Moist Heat 5 Minutes    Moist Heat Location Other (comment)   L pelvic floor in prone with pillow under belly     Manual Therapy   Internal Pelvic Floor rectal Tx: STM/MWM  at pubic rami L, iliococcgyeus L                       PT Long Term Goals - 05/09/20 1528      PT LONG TERM GOAL #1   Title Pt will demo decreased abdominal separation from 4 fingers width  to < 1 fingers width to improve IAP system for pelvic floor function    Time 4    Period Weeks    Status Achieved      PT LONG TERM GOAL #2   Title Pt will demo improved posture with more anterior tilt of pelvis, increased lumbar lordosis, and no cues for proper sitting/ standing posture  in order to minimize CLBP and return to more functional activities    Time 6    Period Weeks    Status Achieved      PT LONG TERM GOAL #3   Title Pt will demo no spinal deviation and more levelled pelvic girdle across 2 visits in order to advance towards deep core/ thoracolumbar strengthening exercises    Time 2    Period Weeks    Status Achieved      PT LONG TERM GOAL #4   Title Pt will demo decreased R pelvic floor mm tightness across 2 visits in order to minimize pelvic pain and straining with bowel movements and  optimize IAP system properly    Time 8    Period Weeks    Status Achieved      PT LONG TERM GOAL #5   Title Pt's FOTO score for Pain to decrease from 29pts to < 24pts and Urinary from 38 pts to < 30 pts and Bowel from 17 pts to < 12 pts and  Bowel Leakage improve 59 pts to > 64 pts    Baseline 11/8:Urinary  38 pts to 33 pts, PFDI Bowel 17pts to 4pts, bowel Leakage 59pts to 72 pts    Time 10    Period Weeks    Status Partially Met      Additional Long Term Goals   Additional Long Term Goals Yes      PT LONG TERM GOAL #6   Title Pt will report no urge to urinate when sitting or bending forward to put on his back pack  in order to attend meetings or leave the house    Time 8    Period Weeks    Status On-going      PT LONG TERM GOAL #7   Title Pt 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  Provided active listening, motivational interviewing, biopsychosocial approaches to boost morale, provided encouragement.  Pt continues to benefit from skilled PT. Plan to address lower kinetic chain to address flat arches and progress with  CKC exercises to  build standing endurance and minimize pelvic floor overactivity and address mm imbalance 2/2 to leg length difference which has since been address with shoe lift.    Examination-Activity Limitations Toileting;Sit    Stability/Clinical Decision Making Evolving/Moderate complexity    Rehab Potential Good    PT Frequency 1x / week    PT Duration Other (comment)   10   PT Treatment/Interventions  Neuromuscular re-education;Patient/family education;Therapeutic exercise;Scar mobilization;Taping;Manual techniques;Moist Heat;Therapeutic activities;Gait training;Balance training    Consulted and Agree with Plan of Care Patient           Patient will benefit from skilled therapeutic intervention in order to improve the following deficits and impairments:  Abnormal gait, Decreased coordination, Decreased range of motion, Difficulty walking, Decreased endurance, Decreased safety awareness, Increased muscle spasms, Decreased balance, Decreased strength, Decreased mobility, Postural dysfunction, Improper body mechanics, Decreased scar mobility, Decreased activity tolerance, Increased fascial restricitons, Hypomobility  Visit Diagnosis: Sacrococcygeal disorders, not elsewhere classified  Chronic bilateral low back pain with left-sided sciatica  Abnormal posture  Other idiopathic scoliosis, thoracolumbar region  Pelvic pain  Urinary frequency     Problem List Patient Active Problem List   Diagnosis Date Noted  . Urinary frequency 12/23/2019  . Pelvic pain 12/23/2019  . Low back pain 12/22/2019    Jerl Mina ,PT, DPT, E-RYT  05/17/2020, 6:37 PM  [place] MAIN [facility] SERVICES [address] [place], [place], [address] Phone: [phone]   Fax:  [phone]  Name: Alec Reynolds MRN: [id number] Date of Birth: 05-12-1971

## 2020-05-17 NOTE — Patient Instructions (Signed)
Skiing sweep in minisquat to stretch pelvic floor and stress release   Apply breath awareness , proper sitting posture

## 2020-05-19 ENCOUNTER — Encounter: Payer: BC Managed Care – PPO | Admitting: Physical Therapy

## 2020-05-24 ENCOUNTER — Other Ambulatory Visit: Payer: Self-pay

## 2020-05-24 ENCOUNTER — Ambulatory Visit: Payer: BC Managed Care – PPO | Admitting: Physical Therapy

## 2020-05-24 DIAGNOSIS — M533 Sacrococcygeal disorders, not elsewhere classified: Secondary | ICD-10-CM | POA: Diagnosis not present

## 2020-05-24 DIAGNOSIS — R102 Pelvic and perineal pain: Secondary | ICD-10-CM

## 2020-05-24 DIAGNOSIS — R293 Abnormal posture: Secondary | ICD-10-CM

## 2020-05-24 DIAGNOSIS — M4125 Other idiopathic scoliosis, thoracolumbar region: Secondary | ICD-10-CM

## 2020-05-24 DIAGNOSIS — R350 Frequency of micturition: Secondary | ICD-10-CM

## 2020-05-24 DIAGNOSIS — G8929 Other chronic pain: Secondary | ICD-10-CM

## 2020-05-24 NOTE — Therapy (Signed)
[place] MAIN [facility] SERVICES [address] [place], [place], [address] Phone: [phone]   Fax:  [phone]  Physical Therapy Treatment  Patient Details  Name: Alec Reynolds MRN: [id number] Date of Birth: 1971-01-30 Referring Provider (PT): Fosnight    Encounter Date: 05/24/2020   PT End of Session - 05/24/20 1312    Visit Number 15    Date for PT Re-Evaluation 07/19/20   PN 05/09/20   PT Start Time 4174    PT Stop Time 1430    PT Time Calculation (min) 87 min    Activity Tolerance Patient tolerated treatment well;No increased pain    Behavior During Therapy [facility] for tasks assessed/performed           No past medical history on file.  Past Surgical History:  Procedure Laterality Date  . ELBOW SURGERY Left   . WRIST SURGERY      There were no vitals filed for this visit.   Subjective Assessment - 05/24/20 1308    Subjective Pt noticed he feels lighter in his stride after last session. Pt noticed his penis did not turn to the left when urinating after the past few session.   Pain decreased 30-40% after leaving last session which lasted for the afternoon / evening. Upon waking, pain was dialed down.  Pt noticed his symptoms in the other area hurt more. After stretching hamstring and adductor stretches, the pain decreases.    Pertinent History R groin injury 2017R ,  anxiety, stress Fx in R shin, low back as kid. 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 Pt also enjoyed cycling and stopped when the pain started. Pt used to cycle 2-3 hours 200 miles / day. Pt did not have a stretch routine. Pt completed Pelvic PT via telehealth one year and tried stretching but it made it worse.    Patient Stated Goals sit down for 2 hours  without hurting, Not  have to feel like he has to pee all the time                          Pelvic Floor Special Questions -  05/24/20 1651    External Perineal Exam with clothing  tenderness at anterior triangle, less tenderness/ tensions when cued for feet co-activation              [facility] Adult PT Treatment/Exercise - 05/24/20 1648      Therapeutic Activites    Other Therapeutic Activities active listening, motivational interviewing  co-created strategies       Neuro Re-ed    Neuro Re-ed Details  cued for toe abduction/ transverse arch co-activation in heel raises and romanian deadlift see pt instructions       Manual Therapy   Manual therapy comments external palpation: tenderness at anterior triangle, less tenderness/ tensions when cued for feet co-activation                        PT Long Term Goals - 05/09/20 1528      PT LONG TERM GOAL #1   Title Pt will demo decreased abdominal separation from 4 fingers width  to < 1 fingers width to improve IAP system for pelvic floor function    Time 4    Period Weeks    Status Achieved  PT LONG TERM GOAL #2   Title Pt will demo improved posture with more anterior tilt of pelvis, increased lumbar lordosis, and no cues for proper sitting/ standing posture  in order to minimize CLBP and return to more functional activities    Time 6    Period Weeks    Status Achieved      PT LONG TERM GOAL #3   Title Pt will demo no spinal deviation and more levelled pelvic girdle across 2 visits in order to advance towards deep core/ thoracolumbar strengthening exercises    Time 2    Period Weeks    Status Achieved      PT LONG TERM GOAL #4   Title Pt will demo decreased R pelvic floor mm tightness across 2 visits in order to minimize pelvic pain and straining with bowel movements and optimize IAP system properly    Time 8    Period Weeks    Status Achieved      PT LONG TERM GOAL #5   Title Pt's FOTO score for Pain to decrease from 29pts to < 24pts and Urinary from 38 pts to < 30 pts and Bowel from 17 pts to < 12 pts and  Bowel Leakage improve 59 pts  to > 64 pts    Baseline 11/8:Urinary  38 pts to 33 pts, PFDI Bowel 17pts to 4pts, bowel Leakage 59pts to 72 pts    Time 10    Period Weeks    Status Partially Met      Additional Long Term Goals   Additional Long Term Goals Yes      PT LONG TERM GOAL #6   Title Pt will report no urge to urinate when sitting or bending forward to put on his back pack  in order to attend meetings or leave the house    Time 8    Period Weeks    Status On-going      PT LONG TERM GOAL #7   Title Pt 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 Pt noticed his penis did not turn to the left when urinating after the past few session. Pain decreased 30-40% after leaving last session which lasted for the afternoon / evening.   Today, provided more biopsychosocial approaches and reinforced themes provided by his psychotherapist. Advanced pt feet co-activation training to minimize pelvic floor mm tightness as pt demo'd flat feet. Pt required cues for new HEP. Pt demo'd IND post training. Pt continues to benefit from skilled PT.     Examination-Activity Limitations Toileting;Sit    Stability/Clinical Decision Making Evolving/Moderate complexity    Rehab Potential Good    PT Frequency 1x / week    PT Duration Other (comment)   10   PT Treatment/Interventions Neuromuscular re-education;Patient/family education;Therapeutic exercise;Scar mobilization;Taping;Manual techniques;Moist Heat;Therapeutic  activities;Gait training;Balance training    Consulted and Agree  with Plan of Care Patient           Patient will benefit from skilled therapeutic intervention in order to improve the following deficits and impairments:  Abnormal gait, Decreased coordination, Decreased range of motion, Difficulty walking, Decreased endurance, Decreased safety awareness, Increased muscle spasms, Decreased balance, Decreased strength, Decreased mobility, Postural dysfunction, Improper body mechanics, Decreased scar mobility, Decreased activity tolerance, Increased fascial restricitons, Hypomobility  Visit Diagnosis: Sacrococcygeal disorders, not elsewhere classified  Chronic bilateral low back pain with left-sided sciatica  Abnormal posture  Other idiopathic scoliosis, thoracolumbar region  Pelvic pain  Urinary frequency     Problem List Patient Active Problem List   Diagnosis Date Noted  . Urinary frequency 12/23/2019  . Pelvic pain 12/23/2019  . Low back pain 12/22/2019    Jerl Mina ,PT, DPT, E-RYT  05/24/2020, 4:53 PM  [place] MAIN [facility] SERVICES [address] [place], [place], [address] Phone: [phone]   Fax:  [phone]  Name: Alec Reynolds MRN: [id number] Date of Birth: [birth year]

## 2020-05-24 NOTE — Patient Instructions (Signed)
Single heel raises barefoot , hand on counter,  Toes spread, ballmounds down    15 reps    __ Letter T, [place] on one leg with band with shoes  band under L foot, wrap by big toe then, outer knee/ thigh, L hand pulling ( elbow by side)  Plant ballmound, toes spread, thigh out against band,, tracking knee in line with 2-3rd toe line Dipping forward, R foot lifts slight ( whole body like a see saw) off floor or  Press back foot against wall 20 reps  Both

## 2020-05-25 ENCOUNTER — Encounter: Payer: BC Managed Care – PPO | Admitting: Physical Therapy

## 2020-05-31 ENCOUNTER — Encounter: Payer: BC Managed Care – PPO | Admitting: Physical Therapy

## 2020-06-02 ENCOUNTER — Ambulatory Visit: Payer: BC Managed Care – PPO | Attending: Physician Assistant | Admitting: Physical Therapy

## 2020-06-02 ENCOUNTER — Other Ambulatory Visit: Payer: Self-pay

## 2020-06-02 ENCOUNTER — Encounter: Payer: BC Managed Care – PPO | Admitting: Physical Therapy

## 2020-06-02 DIAGNOSIS — R350 Frequency of micturition: Secondary | ICD-10-CM | POA: Diagnosis present

## 2020-06-02 DIAGNOSIS — M533 Sacrococcygeal disorders, not elsewhere classified: Secondary | ICD-10-CM | POA: Insufficient documentation

## 2020-06-02 DIAGNOSIS — G8929 Other chronic pain: Secondary | ICD-10-CM | POA: Diagnosis present

## 2020-06-02 DIAGNOSIS — M4125 Other idiopathic scoliosis, thoracolumbar region: Secondary | ICD-10-CM

## 2020-06-02 DIAGNOSIS — R293 Abnormal posture: Secondary | ICD-10-CM | POA: Diagnosis present

## 2020-06-02 DIAGNOSIS — M5442 Lumbago with sciatica, left side: Secondary | ICD-10-CM | POA: Diagnosis present

## 2020-06-02 DIAGNOSIS — R102 Pelvic and perineal pain: Secondary | ICD-10-CM

## 2020-06-02 NOTE — Patient Instructions (Signed)
   _________  Feet slides :   Points of contact at sitting bones  Four points of contact of foot, Heel up, ankle not twist out Lower heel Four points of contact of foot, Slide foot back   Repeated with other foot    __   PLEATS: BALLERINA  Heel raises in ballerina position, pressing rolled towel between heels  Hands at a corner of a wall   Knees bent pointed out like a "v" , navel ( center of mass) more forward  Heels together as you lift, pointed out like a "v"  KNEES ARE ALIGNED BEHIND THE TOES TO MINIMIZE STRAIN ON THE KNEES Lower heels down slow with your  navel ( center of mass) more forward to avoid dropping down fast and rocking more weight back onto heels   10 reps    ___  Heel raises  10 reps  NO toe gripping Spread ballmounds ( Nigel feet)

## 2020-06-02 NOTE — Therapy (Signed)
[place] MAIN [facility] SERVICES [address] [place], [place], [address] Phone: [phone]   Fax:  [phone]  Physical Therapy Treatment  Patient Details  Name: Alec Reynolds MRN: [id number] Date of Birth: 22-Feb-1971 Referring Provider (PT): Fosnight    Encounter Date: 06/02/2020   PT End of Session - 06/02/20 1401    Visit Number 16    Date for PT Re-Evaluation 07/19/20   PN 05/09/20   PT Start Time 1303    PT Stop Time 1402    PT Time Calculation (min) 59 min    Activity Tolerance Patient tolerated treatment well;No increased pain    Behavior During Therapy [facility] for tasks assessed/performed           No past medical history on file.  Past Surgical History:  Procedure Laterality Date  . ELBOW SURGERY Left   . WRIST SURGERY      There were no vitals filed for this visit.   Subjective Assessment - 06/02/20 1307    Subjective After last session, pt has noticed more often now, his penis is softer and more relaxed. The bend of the penis and painful urination is decreasing. Positive improvements: Pt is having less urgency to go urinate when standing .  When pt stretches hamstrings and calves, he notices everything is calmer.    Last Wednesday, after playing tennis with high exertion to a real good tennis player and afterwards, he noticed the L pelvic area getting dialed up.  Pt noticed night time voiding 3-4 x last week but previously 2x per night.    Pertinent History R groin injury 2017R ,  anxiety, stress Fx in R shin, low back as kid. 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 Pt also enjoyed cycling and stopped when the pain started. Pt used to cycle 2-3 hours 200 miles / day. Pt did not have a stretch routine. Pt completed Pelvic PT via telehealth one year and tried stretching but it made it worse.    Patient Stated Goals sit down for 2 hours  without hurting, Not   have to feel like he has to pee all the time              [facility] PT Assessment - 06/02/20 1356      Single Leg Stance   Comments toe gripping, limited DF       AROM   Overall AROM Comments DF OKC: B 100 deg, post Tx 90deg, CKC 110 deg       Palpation   Palpation comment hypomobile midfoot B, lacking supination                          [facility] Adult PT Treatment/Exercise - 06/02/20 1357      Neuro Re-ed    Neuro Re-ed Details  cued for PF/INV and new afeet exercises to promote arch lift       Manual Therapy   Manual therapy comments  B distraction at talocrural joints, AP/PA along rays to promote balance between supination/ pronation, STM at transverse head of hallus adductor mm, along pereoneal longus, AP/ superior mob at fibula head to promote DF/EV     [place] promote arch lift  PT Long Term Goals - 06/02/20 1403      PT LONG TERM GOAL #1   Title Pt will demo decreased abdominal separation from 4 fingers width  to < 1 fingers width to improve IAP system for pelvic floor function    Time 4    Period Weeks    Status Achieved      PT LONG TERM GOAL #2   Title Pt will demo improved posture with more anterior tilt of pelvis, increased lumbar lordosis, and no cues for proper sitting/ standing posture  in order to minimize CLBP and return to more functional activities    Time 6    Period Weeks    Status Achieved      PT LONG TERM GOAL #3   Title Pt will demo no spinal deviation and more levelled pelvic girdle across 2 visits in order to advance towards deep core/ thoracolumbar strengthening exercises    Time 2    Period Weeks    Status Achieved      PT LONG TERM GOAL #4   Title Pt will demo decreased R pelvic floor mm tightness across 2 visits in order to minimize pelvic pain and straining with bowel movements and optimize IAP system properly    Time 8    Period Weeks     Status Achieved      PT LONG TERM GOAL #5   Title Pt's FOTO score for Pain to decrease from 29pts to < 24pts and Urinary from 38 pts to < 30 pts and Bowel from 17 pts to < 12 pts and  Bowel Leakage improve 59 pts to > 64 pts    Baseline 11/8:Urinary  38 pts to 33 pts, PFDI Bowel 17pts to 4pts, bowel Leakage 59pts to 72 pts    Time 10    Period Weeks    Status Partially Met      Additional Long Term Goals   Additional Long Term Goals Yes      PT LONG TERM GOAL #6   Title Pt will report no urge to urinate when sitting or bending forward to put on his back pack  in order to attend meetings or leave the house    Time 8    Period Weeks    Status On-going      PT LONG TERM GOAL #7   Title Pt 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 Pt required cues for more co-activation of transverse arch and more supination / activation of hip abduction. Anticipate regional interdependence approach will help minimize overactivity pelvic floor and optimize lower kinetic chain for more endurance with standing and tennis playing. Pt requires more Tx approaches to balance under/ over used mm involved in his lifestyle as an athlete. Pt continues to benefit from  skilled PT    Examination-Activity Limitations Toileting;Sit    Stability/Clinical Decision Making Evolving/Moderate complexity    Rehab Potential Good    PT Frequency 1x / week    PT Duration Other (comment)   10   PT Treatment/Interventions Neuromuscular  re-education;Patient/family education;Therapeutic exercise;Scar mobilization;Taping;Manual techniques;Moist Heat;Therapeutic activities;Gait training;Balance training    Consulted and Agree with Plan of Care Patient           Patient will benefit from skilled therapeutic intervention in order to improve the following deficits and impairments:  Abnormal gait, Decreased coordination, Decreased range of motion, Difficulty walking, Decreased endurance, Decreased safety awareness, Increased muscle spasms, Decreased balance, Decreased strength, Decreased mobility, Postural dysfunction, Improper body mechanics, Decreased scar mobility, Decreased activity tolerance, Increased fascial restricitons, Hypomobility  Visit Diagnosis: Pelvic pain  Chronic bilateral low back pain with left-sided sciatica  Abnormal posture  Other idiopathic scoliosis, thoracolumbar region  Urinary frequency     Problem List Patient Active Problem List   Diagnosis Date Noted  . Urinary frequency 12/23/2019  . Pelvic pain 12/23/2019  . Low back pain 12/22/2019    Jerl Mina ,PT, DPT, E-RYT  06/02/2020, 5:47 PM  [place] MAIN [facility] SERVICES [address] [place], [place], [address] Phone: [phone]   Fax:  [phone]  Name: Alec Reynolds MRN: [id number] Date of Birth: 28-Aug-1970

## 2020-06-09 ENCOUNTER — Other Ambulatory Visit: Payer: Self-pay

## 2020-06-09 ENCOUNTER — Ambulatory Visit: Payer: BC Managed Care – PPO | Admitting: Physical Therapy

## 2020-06-09 DIAGNOSIS — M4125 Other idiopathic scoliosis, thoracolumbar region: Secondary | ICD-10-CM

## 2020-06-09 DIAGNOSIS — M5442 Lumbago with sciatica, left side: Secondary | ICD-10-CM

## 2020-06-09 DIAGNOSIS — M533 Sacrococcygeal disorders, not elsewhere classified: Secondary | ICD-10-CM

## 2020-06-09 DIAGNOSIS — R102 Pelvic and perineal pain: Secondary | ICD-10-CM | POA: Diagnosis not present

## 2020-06-09 DIAGNOSIS — R293 Abnormal posture: Secondary | ICD-10-CM

## 2020-06-09 DIAGNOSIS — R350 Frequency of micturition: Secondary | ICD-10-CM

## 2020-06-09 DIAGNOSIS — G8929 Other chronic pain: Secondary | ICD-10-CM

## 2020-06-09 NOTE — Therapy (Signed)
[place] MAIN [facility] SERVICES [address] [place], [place], [address] Phone: [phone]   Fax:  [phone]  Physical Therapy Treatment  Patient Details  Name: Alec Reynolds MRN: [id number] Date of Birth: Aug 27, 1970 Referring Provider (PT): Fosnight    Encounter Date: 06/09/2020   PT End of Session - 06/09/20 1323    Visit Number 17    Date for PT Re-Evaluation 07/19/20   PN 05/09/20   PT Start Time 1300    PT Stop Time 1400    PT Time Calculation (min) 60 min    Activity Tolerance Patient tolerated treatment well;No increased pain    Behavior During Therapy [facility] for tasks assessed/performed           No past medical history on file.  Past Surgical History:  Procedure Laterality Date  . ELBOW SURGERY Left   . WRIST SURGERY      There were no vitals filed for this visit.   Subjective Assessment - 06/09/20 1303    Subjective After last session, pt reports less pain in intensity. Pt noticed his legs getting fatigued after standing differently. Pt is more aware of standing on the ballmounds of his feet. Pt reports the pelvic floor pain with sitting has toned down.  The pain with urination and bowel movement is overall better. After the bowel movements, it is easier to sit.    Pertinent History R groin injury 2017R ,  anxiety, stress Fx in R shin, low back as kid. 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 Pt also enjoyed cycling and stopped when the pain started. Pt used to cycle 2-3 hours 200 miles / day. Pt did not have a stretch routine. Pt completed Pelvic PT via telehealth one year and tried stretching but it made it worse.    Patient Stated Goals sit down for 2 hours  without hurting, Not  have to feel like he has to pee all the time              [facility] PT Assessment - 06/09/20 1403      Observation/Other Assessments   Observations L distal phalanges  deviated to L      Coordination   Gross Motor Movements are Fluid and Coordinated --   poor eccentric control of plantar fascia, forwad/back step     Palpation   Palpation comment Increased tensions at plantar fascia B, hypomobile midfoot, nivacular / cuneform joints B ,                         [facility] Adult PT Treatment/Exercise - 06/09/20 1405      Neuro Re-ed    Neuro Re-ed Details  cued for more lifted arches and spread of toes and better ecentric control of lowering heel with lifted arches in HEP      Manual Therapy   Manual therapy comments distraction, AP/PA mob, STM/MWM to address feet ( see assessment) and promote lifted arch      [employer] [place] promote arch lift                        PT Long Term Goals - 06/02/20 1403      PT LONG TERM GOAL #1   Title Pt will demo decreased abdominal separation from 4 fingers width  to < 1  fingers width to improve IAP system for pelvic floor function    Time 4    Period Weeks    Status Achieved      PT LONG TERM GOAL #2   Title Pt will demo improved posture with more anterior tilt of pelvis, increased lumbar lordosis, and no cues for proper sitting/ standing posture  in order to minimize CLBP and return to more functional activities    Time 6    Period Weeks    Status Achieved      PT LONG TERM GOAL #3   Title Pt will demo no spinal deviation and more levelled pelvic girdle across 2 visits in order to advance towards deep core/ thoracolumbar strengthening exercises    Time 2    Period Weeks    Status Achieved      PT LONG TERM GOAL #4   Title Pt will demo decreased R pelvic floor mm tightness across 2 visits in order to minimize pelvic pain and straining with bowel movements and optimize IAP system properly    Time 8    Period Weeks    Status Achieved      PT LONG TERM GOAL #5   Title Pt's FOTO score for Pain to decrease from 29pts to < 24pts and Urinary from 38 pts to < 30 pts and Bowel  from 17 pts to < 12 pts and  Bowel Leakage improve 59 pts to > 64 pts    Baseline 11/8:Urinary  38 pts to 33 pts, PFDI Bowel 17pts to 4pts, bowel Leakage 59pts to 72 pts    Time 10    Period Weeks    Status Partially Met      Additional Long Term Goals   Additional Long Term Goals Yes      PT LONG TERM GOAL #6   Title Pt will report no urge to urinate when sitting or bending forward to put on his back pack  in order to attend meetings or leave the house    Time 8    Period Weeks    Status On-going      PT LONG TERM GOAL #7   Title Pt 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 Pt required more manual Tx to promote more lifted arches in feet to build staningg endurance and tennis playing. Required cues today for more supination and toe abduction .   Pt continues to benefits skilled PT.    Examination-Activity Limitations Toileting;Sit    Stability/Clinical Decision Making Evolving/Moderate complexity    Rehab Potential Good    PT Frequency 1x / week    PT Duration Other (comment)   10   PT Treatment/Interventions Neuromuscular re-education;Patient/family education;Therapeutic exercise;Scar mobilization;Taping;Manual techniques;Moist Heat;Therapeutic activities;Gait training;Balance training    Consulted and Agree with Plan of Care Patient  Patient will benefit from skilled therapeutic intervention in order to improve the following deficits and impairments:  Abnormal gait,Decreased coordination,Decreased  range of motion,Difficulty walking,Decreased endurance,Decreased safety awareness,Increased muscle spasms,Decreased balance,Decreased strength,Decreased mobility,Postural dysfunction,Improper body mechanics,Decreased scar mobility,Decreased activity tolerance,Increased fascial restricitons,Hypomobility  Visit Diagnosis: Pelvic pain  Sacrococcygeal disorders, not elsewhere classified  Chronic bilateral low back pain with left-sided sciatica  Abnormal posture  Other idiopathic scoliosis, thoracolumbar region  Urinary frequency     Problem List Patient Active Problem List   Diagnosis Date Noted  . Urinary frequency 12/23/2019  . Pelvic pain 12/23/2019  . Low back pain 12/22/2019    Jerl Mina ,PT, DPT, E-RYT  06/09/2020, 2:12 PM  [place] MAIN [facility] SERVICES [address] [place], [place], [address] Phone: [phone]   Fax:  [phone]  Name: Romuald Mccaslin MRN: [id number] Date of Birth: 04-06-1971

## 2020-06-09 NOTE — Patient Instructions (Addendum)
DO differently with  :  band walking:  lift knees higher   step like pink panther to use all parts of foot  6 min  ___   Pleats Ballerina:  Heels together the whole time , knees bent Spread toes,  Lower heel with more pressure on the outside of foot to lift arch   ___  Step up and down exercise,  See video on your phone

## 2020-06-16 ENCOUNTER — Ambulatory Visit: Payer: BC Managed Care – PPO | Admitting: Physical Therapy

## 2020-06-16 ENCOUNTER — Other Ambulatory Visit: Payer: Self-pay

## 2020-06-16 DIAGNOSIS — G8929 Other chronic pain: Secondary | ICD-10-CM

## 2020-06-16 DIAGNOSIS — R350 Frequency of micturition: Secondary | ICD-10-CM

## 2020-06-16 DIAGNOSIS — M533 Sacrococcygeal disorders, not elsewhere classified: Secondary | ICD-10-CM

## 2020-06-16 DIAGNOSIS — R102 Pelvic and perineal pain: Secondary | ICD-10-CM | POA: Diagnosis not present

## 2020-06-16 DIAGNOSIS — M4125 Other idiopathic scoliosis, thoracolumbar region: Secondary | ICD-10-CM

## 2020-06-16 DIAGNOSIS — R293 Abnormal posture: Secondary | ICD-10-CM

## 2020-06-16 NOTE — Patient Instructions (Addendum)
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

## 2020-06-16 NOTE — Therapy (Addendum)
[place] MAIN [facility] SERVICES [address] [place], [place], [address] Phone: [phone]   Fax:  [phone]  Physical Therapy Treatment  Patient Details  Name: Alec Reynolds MRN: [id number] Date of Birth: 07/01/1971 Referring Provider (PT): Fosnight    Encounter Date: 06/16/2020   PT End of Session - 06/16/20 1313    Visit Number 18    Date for PT Re-Evaluation 07/19/20   PN 05/09/20   PT Start Time 1017    PT Stop Time 1403    PT Time Calculation (min) 60 min    Activity Tolerance Patient tolerated treatment well;No increased pain    Behavior During Therapy [facility] for tasks assessed/performed           No past medical history on file.  Past Surgical History:  Procedure Laterality Date  . ELBOW SURGERY Left   . WRIST SURGERY      There were no vitals filed for this visit.   Subjective Assessment - 06/16/20 1305    Subjective Pt reported overall, he is doing 20-30% better. The spot by the L buttocks had decreased by 40%.  Pt noticed his L hamstring and L low back tried to cramp up but did not fully cramp up during a tennis match with advanced tennis player. After this tennis match, his Sx increased but they decreased 2 days later. Four days after last session and threes days after playing tennis, he experienced a full erection at 4am. That day, his Sx flared up with pain and frequency urination, diarrhea, pain in L penis, and one spot by the buttocks. Tehe Sx are subsiding since that day. Pt has been using the propioceptive insoles in his shoes and he notices his feet are working more. Since the erection, pt noticed the clam and the ballerina exercise has caused pain along the L top and bottom pelvic floor mm but it has been subsiding with these exercises. One improvement he noticed is that his penis continues to stay soft with urination across the past month.  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    Pertinent History R groin injury 2017R ,  anxiety, stress Fx in R shin, low back as kid. 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 Pt also enjoyed cycling and stopped when the pain started. Pt used to cycle 2-3 hours 200 miles / day. Pt did not have a stretch routine. Pt completed Pelvic PT via telehealth one year and tried stretching but it made it worse.    Patient Stated Goals sit down for 2 hours  without hurting, Not  have to feel like he has to pee all the time              [facility] PT Assessment - 06/16/20 1356      Other:   Other/ Comments simulated feeding tennis balls stance: showed excessive medial collpase at L medial k nee, and L flank, and collapse at medial arch of foot      Palpation   Palpation comment fascial restrictions at medial knee, quad, [facility] Adult PT Treatment/Exercise -  06/16/20 1357      Therapeutic Activites    Other Therapeutic Activities discussed water intake 2 hours prior to rest breaks instead of sipping all day , standing stretches while on the tennis court to minimize frequent urination      Neuro Re-ed    Neuro Re-ed Details  cued for more lower kinetic chain alignment and diassociation to minimize staining knee/ hip adduction/ IR and low back  shortening on L      Manual Therapy   Manual therapy comments distraction at tibial plateau, rotation mob to minimize tightness at areas noted on assessment    Kinesiotex --   promote ER rotation of L thigh, knee                      PT Long Term Goals - 06/02/20 1403      PT LONG TERM GOAL #1   Title Pt will demo decreased abdominal separation from 4 fingers width  to < 1 fingers width to improve IAP system for pelvic floor function    Time 4     Period Weeks    Status Achieved      PT LONG TERM GOAL #2   Title Pt will demo improved posture with more anterior tilt of pelvis, increased lumbar lordosis, and no cues for proper sitting/ standing posture  in order to minimize CLBP and return to more functional activities    Time 6    Period Weeks    Status Achieved      PT LONG TERM GOAL #3   Title Pt will demo no spinal deviation and more levelled pelvic girdle across 2 visits in order to advance towards deep core/ thoracolumbar strengthening exercises    Time 2    Period Weeks    Status Achieved      PT LONG TERM GOAL #4   Title Pt will demo decreased R pelvic floor mm tightness across 2 visits in order to minimize pelvic pain and straining with bowel movements and optimize IAP system properly    Time 8    Period Weeks    Status Achieved      PT LONG TERM GOAL #5   Title Pt's FOTO score for Pain to decrease from 29pts to < 24pts and Urinary from 38 pts to < 30 pts and Bowel from 17 pts to < 12 pts and  Bowel Leakage improve 59 pts to > 64 pts    Baseline 11/8:Urinary  38 pts to 33 pts, PFDI Bowel 17pts to 4pts, bowel Leakage 59pts to 72 pts    Time 10    Period Weeks    Status Partially Met      Additional Long Term Goals   Additional Long Term Goals Yes      PT LONG TERM GOAL #6   Title Pt will report no urge to urinate when sitting or bending forward to put on his back pack  in order to attend meetings or leave the house    Time 8    Period Weeks    Status On-going      PT LONG TERM GOAL #7   Title Pt 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  Pt managed the relapse with strategies he learned from his psychotherapist and maintained stretches and HEP.  Remaining issues include frequent urination occurring with long periods of standing while coaching tennis and  L pelvic pain area which is decreasing  Discussed water intake 2 hours prior to rest breaks instead of sipping all day , standing stretches while on the tennis court to minimize frequent urination. Provided standing stretches for quad, hamstrings, hip abduction mm to minimize pelvic floor tightening and added cuing for technique. Provided education on proper pivoting technique to minimize shortening of L kinetic chain ( medial collapse of foot, genu valgus, hip add/IR which is likely related to L pelvic floor shortening of mm and more tightness at L thigh. Manual Tx facilitated more external rotation/ hip abduction with mobilizations at medial knee to release mm attachments. Pt demo'd improved form and technique with more disassociation between trunk and pelvis/ BLE with repeated task as Audiological scientist. Anticipate pt will minimize mm imbalance with these areas of focus today.    After last session which focused improving his feet mobility , pt noticed he had a nocturnal erection which occurred at 4am. Pt voiced understanding of how relaxation contributes to this function. Plan to educate pt  Next session on the autonomic nervous system related to erections/ ejaculations and also education on micturition/ Bradley loops to address urinary frequency. Pt continues to benefit from skilled PT.        Examination-Activity Limitations Toileting;Sit    Stability/Clinical Decision Making Evolving/Moderate complexity    Rehab Potential Good    PT Frequency 1x / week    PT Duration Other (comment)   10   PT Treatment/Interventions Neuromuscular re-education;Patient/family  education;Therapeutic exercise;Scar mobilization;Taping;Manual techniques;Moist Heat;Therapeutic activities;Gait training;Balance training    Consulted and Agree with Plan of Care Patient           Patient will benefit from skilled therapeutic intervention in order to improve the following deficits and impairments:  Abnormal gait,Decreased coordination,Decreased range of motion,Difficulty walking,Decreased endurance,Decreased safety awareness,Increased muscle spasms,Decreased balance,Decreased strength,Decreased mobility,Postural dysfunction,Improper body mechanics,Decreased scar mobility,Decreased activity tolerance,Increased fascial restricitons,Hypomobility  Visit Diagnosis: Sacrococcygeal disorders, not elsewhere classified  Chronic bilateral low back pain with left-sided sciatica  Pelvic pain  Abnormal posture  Other idiopathic scoliosis, thoracolumbar region  Urinary frequency     Problem List Patient Active Problem List   Diagnosis Date Noted  . Urinary frequency 12/23/2019  . Pelvic pain 12/23/2019  . Low back pain 12/22/2019    Jerl Mina ,PT, DPT, E-RYT  06/16/2020, 2:19 PM  [place] MAIN [facility] SERVICES [address] [place], [place], [address] Phone: [phone]   Fax:  [phone]  Name: Alec Reynolds MRN: [id number] Date of Birth: [birth year]

## 2020-06-23 ENCOUNTER — Ambulatory Visit: Payer: BC Managed Care – PPO | Admitting: Physical Therapy

## 2020-06-23 ENCOUNTER — Other Ambulatory Visit: Payer: Self-pay

## 2020-06-23 DIAGNOSIS — M4125 Other idiopathic scoliosis, thoracolumbar region: Secondary | ICD-10-CM

## 2020-06-23 DIAGNOSIS — M533 Sacrococcygeal disorders, not elsewhere classified: Secondary | ICD-10-CM

## 2020-06-23 DIAGNOSIS — R102 Pelvic and perineal pain: Secondary | ICD-10-CM

## 2020-06-23 DIAGNOSIS — R350 Frequency of micturition: Secondary | ICD-10-CM

## 2020-06-23 DIAGNOSIS — R293 Abnormal posture: Secondary | ICD-10-CM

## 2020-06-23 DIAGNOSIS — M5442 Lumbago with sciatica, left side: Secondary | ICD-10-CM

## 2020-06-23 NOTE — Therapy (Signed)
[place] MAIN [facility] SERVICES [address] [place], [place], [address] Phone: [phone]   Fax:  [phone]  Physical Therapy Treatment  Patient Details  Name: Alec Reynolds MRN: [id number] Date of Birth: [birth year] Referring Provider (PT): Fosnight    Encounter Date: 06/23/2020   PT End of Session - 06/23/20 1813    Visit Number 19    Date for PT Re-Evaluation 07/19/20   PN 05/09/20   PT Start Time 1302    PT Stop Time 1405    PT Time Calculation (min) 63 min    Activity Tolerance Patient tolerated treatment well;No increased pain    Behavior During Therapy [facility] for tasks assessed/performed           No past medical history on file.  Past Surgical History:  Procedure Laterality Date   ELBOW SURGERY Left    WRIST SURGERY      There were no vitals filed for this visit.   Subjective Assessment - 06/23/20 1307    Subjective Pt had partial erections across 3 nights last week and has the urge to pee. After the erections go down, then it seems to get painful at sitting bone and base of penis  .  Pt used the massage gun on his L glut but it made it worse. Pt had used it on other body parts before with sports and had not issues. 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  In the last couple of weeks, pt has felt more pain at sitting bone and base of penis since the firm erection two weeks ago that occured at night. The area seems "more intense and angry".  On days when he does not have any nighttime erections, he does feel his urgency and pain is not as dialed.    Pertinent History R groin injury 2017R ,  anxiety, stress Fx in R shin, low back as kid. 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 Pt also enjoyed cycling and stopped when the pain started. Pt used to cycle 2-3 hours 200 miles / day. Pt did not  have a stretch routine. Pt completed Pelvic PT via telehealth one year and tried stretching but it made it worse.    Patient Stated Goals sit down for 2 hours  without hurting, Not  have to feel like he has to pee all the time              [facility] PT Assessment - 06/23/20 1813      Observation/Other Assessments   Observations slumped sitting and standing wit posterior tilt of pelvis , required cues                         [facility] Adult PT Treatment/Exercise - 06/23/20 1814      Therapeutic Activites    Other Therapeutic Activities biopsychosocial approaches to reinforce posture, education on neuroscience of pain, nervous system / physiology to sexual function , active listening to pt's concerns related to partial /full erections and relationship to increased pain and frequency with explanations  to his questions      Neuro Re-ed    Neuro Re-ed Details  cued for anterior tilt of pelvis in sitting and standing with more co-activation of feet  PT Long Term Goals - 06/02/20 1403      PT LONG TERM GOAL #1   Title Pt will demo decreased abdominal separation from 4 fingers width  to < 1 fingers width to improve IAP system for pelvic floor function    Time 4    Period Weeks    Status Achieved      PT LONG TERM GOAL #2   Title Pt will demo improved posture with more anterior tilt of pelvis, increased lumbar lordosis, and no cues for proper sitting/ standing posture  in order to minimize CLBP and return to more functional activities    Time 6    Period Weeks    Status Achieved      PT LONG TERM GOAL #3   Title Pt will demo no spinal deviation and more levelled pelvic girdle across 2 visits in order to advance towards deep core/ thoracolumbar strengthening exercises    Time 2    Period Weeks    Status Achieved      PT LONG TERM GOAL #4   Title Pt will demo decreased R pelvic floor mm tightness across 2 visits in order to minimize pelvic  pain and straining with bowel movements and optimize IAP system properly    Time 8    Period Weeks    Status Achieved      PT LONG TERM GOAL #5   Title Pt's FOTO score for Pain to decrease from 29pts to < 24pts and Urinary from 38 pts to < 30 pts and Bowel from 17 pts to < 12 pts and  Bowel Leakage improve 59 pts to > 64 pts    Baseline 11/8:Urinary  38 pts to 33 pts, PFDI Bowel 17pts to 4pts, bowel Leakage 59pts to 72 pts    Time 10    Period Weeks    Status Partially Met      Additional Long Term Goals   Additional Long Term Goals Yes      PT LONG TERM GOAL #6   Title Pt will report no urge to urinate when sitting or bending forward to put on his back pack  in order to attend meetings or leave the house    Time 8    Period Weeks    Status On-going      PT LONG TERM GOAL #7   Title Pt 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    Time 8    Period Weeks    Status On-going      PT LONG TERM GOAL #8   Title Pt will report decreased urgency by 50% during work day 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 Pt required cues to find anterior tilt of pelvic in sitting and standing to minimize loading the area of pelvic pain on L. Pt continues to benefit from skilled PT.    Examination-Activity Limitations Toileting;Sit  Stability/Clinical Decision Making Evolving/Moderate complexity    Rehab Potential Good    PT Frequency 1x / week    PT Duration Other (comment)   10   PT  Treatment/Interventions Neuromuscular re-education;Patient/family education;Therapeutic exercise;Scar mobilization;Taping;Manual techniques;Moist Heat;Therapeutic activities;Gait training;Balance training    Consulted and Agree with Plan of Care Patient           Patient will benefit from skilled therapeutic intervention in order to improve the following deficits and impairments:  Abnormal gait,Decreased coordination,Decreased range of motion,Difficulty walking,Decreased endurance,Decreased safety awareness,Increased muscle spasms,Decreased balance,Decreased strength,Decreased mobility,Postural dysfunction,Improper body mechanics,Decreased scar mobility,Decreased activity tolerance,Increased fascial restricitons,Hypomobility  Visit Diagnosis: Abnormal posture  Sacrococcygeal disorders, not elsewhere classified  Chronic bilateral low back pain with left-sided sciatica  Pelvic pain  Other idiopathic scoliosis, thoracolumbar region  Urinary frequency     Problem List Patient Active Problem List   Diagnosis Date Noted   Urinary frequency 12/23/2019   Pelvic pain 12/23/2019   Low back pain 12/22/2019    Jerl Mina 06/23/2020, 6:19 PM  [place] MAIN [facility] SERVICES [address] [place], [place], [address] Phone: [phone]   Fax:  [phone]  Name: Alec Reynolds MRN: [id number] Date of Birth: Feb 27, 1971

## 2020-06-23 NOTE — Patient Instructions (Signed)
Applying Pelvic tilts:  Finding a comfortable position when laying on your back  Laying on your back, lift hips up, then scoot tail under, lowering ribs / midback first, then the low back Pillow under knees    Decreasing Low back pain:  Pelvic tilts Forward, Back, and Neutral   STANDING ( neutral is where you want to practice finding more and more often. More weight across the ball mound of feet and heels not only the heels, and not locking the knees. Less slumping   SITTING: feet under knees, hip width apart. Weigh on the sitting bones. Thumb on the back iliac crest, Index finger at the front of the hip. Rock through 3 positions to find neutral, press in the feet and sense the sitting bones ( ischial tuberosity) in contact to the seat.   Posterior tilt ( thumb is lower)  Anterior tilt ( index finger is lower).   Neutral ( thumb and index finger is levelled)    Sitting at work chair that may have a dip in the seat Place folded towel/ blanket placed towards the back of the seat , sitting on sitting bones , don't lean to the back of the chair

## 2020-06-30 ENCOUNTER — Other Ambulatory Visit: Payer: Self-pay

## 2020-06-30 ENCOUNTER — Ambulatory Visit: Payer: BC Managed Care – PPO | Admitting: Physical Therapy

## 2020-06-30 DIAGNOSIS — R102 Pelvic and perineal pain: Secondary | ICD-10-CM

## 2020-06-30 DIAGNOSIS — R350 Frequency of micturition: Secondary | ICD-10-CM

## 2020-06-30 DIAGNOSIS — M4125 Other idiopathic scoliosis, thoracolumbar region: Secondary | ICD-10-CM

## 2020-06-30 DIAGNOSIS — R293 Abnormal posture: Secondary | ICD-10-CM

## 2020-06-30 DIAGNOSIS — G8929 Other chronic pain: Secondary | ICD-10-CM

## 2020-06-30 NOTE — Patient Instructions (Addendum)
Pillow between knees when sleeping on side   __  Minisquat with blue band pulls ( elbow bent, like "W" )  Scoot buttocks back slight, hinge like you are looking at your reflection on a pond  Knees behind toes,  Inhale to "smell flowers" (not over arch the back and focus on ballmounds entire time on down and up)   Exhale on the rise "like rocket"  Do not lock knees, have more weight across ballmounds of feet, toes relaxed   20 reps   ___  Bridging series w/ resistive band other side of doorknob:  Level 1:  Position:  Elbows bent, knees hip width apart, heels under knees   Stabilization points: shoulders, upper arms, back of head pressed into floor. Heel press downward.   Movement: inhale do nothing, exhale pull band by side, lower fists to floor completely while lifting hips.Keep stabilization points engaged when you allow the band to go back to starting position  10 x 2 reps       Level 2:  Position:  Elbows straight, arms raised to ceiling at shoulder height, knees apart like a ballerina,heels together, heels under knees  Stabilization points: shoulders, upper arms, back of head pressed into floor. Heel press downward.   Movement: inhale do nothing, exhale pull band by side, lower fists to floor completely while lifting hips. Keep stabilization points engaged when you allow the band to go back to starting position   10 x 2 reps  Shoulder training: Try to imagine you are squeezing a pencil under your armpit and your shoulder blades are down away from your ears and towards each other     __      Transition from standing to floor :  stand to floor transfer :      _ slow     _ mini squat      _ crawl down with one hand on thigh      _downward dog  - >  shoulders down and back-  walk the dog ( knee bents to lengthe hamstrings)      Floor to stand :   downward dog   crawl hands back, butt is back, knees behind toes -> squat  Hands at waist , elbows back, chest  lifts

## 2020-06-30 NOTE — Therapy (Signed)
[place] [facility] SERVICES [address] [place], [place], [address] Phone: [phone]   Fax:  [phone]  Physical Therapy Treatment  Patient Details  Name: Alec Reynolds MRN: [id number] Date of Birth: 03-12-1971 Referring Provider (PT): Fosnight    Encounter Date: 06/30/2020   PT End of Session - 06/30/20 1346    Visit Number 20    Date for PT Re-Evaluation 07/19/20   PN 05/09/20   PT Start Time 2035    PT Stop Time 1420    PT Time Calculation (min) 75 min    Activity Tolerance Patient tolerated treatment well;No increased pain    Behavior During Therapy [facility] for tasks assessed/performed           No past medical history on file.  Past Surgical History:  Procedure Laterality Date   ELBOW SURGERY Left    WRIST SURGERY      There were no vitals filed for this visit.   Subjective Assessment - 06/30/20 1310    Subjective Pt reported bending foward brings on an urge to urinate and pain in the gluts and L sacral area. Pt reported no increased pelvic pain after noctural erection    Pertinent History R groin injury 2017R ,  anxiety, stress Fx in R shin, low back as kid. 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 Pt also enjoyed cycling and stopped when the pain started. Pt used to cycle 2-3 hours 200 miles / day. Pt did not have a stretch routine. Pt completed Pelvic PT via telehealth one year and tried stretching but it made it worse.    Patient Stated Goals sit down for 2 hours  without hurting, Not  have to feel like he has to pee all the time              [facility] PT Assessment - 06/30/20 1404      Squat   Comments poor alignment,  increased lumbar lordosis      Posture/Postural Control   Posture Comments self-correct for anterior COM but requried cues for anterior pelvic tilts                         [facility] Adult PT Treatment/Exercise -  06/30/20 1404      Neuro Re-ed    Neuro Re-ed Details  cued for scapulothoracolumbarglut   cued for prioception of pelvis     Manual Therapy   Internal Pelvic Floor STM/MWM at 12 oclock/ perineum , mild tightness at iliococcygeus ( reported increased pain when in posterior tilt of pelvis, less pain with in anterior tilt )                       PT Long Term Goals - 06/02/20 1403      PT LONG TERM GOAL #1   Title Pt will demo decreased abdominal separation from 4 fingers width  to < 1 fingers width to improve IAP system for pelvic floor function    Time 4    Period Weeks    Status Achieved      PT LONG TERM GOAL #2   Title Pt will demo improved posture with more anterior tilt of pelvis, increased lumbar lordosis, and no cues for proper sitting/ standing posture  in order to minimize CLBP and return to more functional activities  Time 6    Period Weeks    Status Achieved      PT LONG TERM GOAL #3   Title Pt will demo no spinal deviation and more levelled pelvic girdle across 2 visits in order to advance towards deep core/ thoracolumbar strengthening exercises    Time 2    Period Weeks    Status Achieved      PT LONG TERM GOAL #4   Title Pt will demo decreased R pelvic floor mm tightness across 2 visits in order to minimize pelvic pain and straining with bowel movements and optimize IAP system properly    Time 8    Period Weeks    Status Achieved      PT LONG TERM GOAL #5   Title Pt's FOTO score for Pain to decrease from 29pts to < 24pts and Urinary from 38 pts to < 30 pts and Bowel from 17 pts to < 12 pts and  Bowel Leakage improve 59 pts to > 64 pts    Baseline 11/8:Urinary  38 pts to 33 pts, PFDI Bowel 17pts to 4pts, bowel Leakage 59pts to 72 pts    Time 10    Period Weeks    Status Partially Met      Additional Long Term Goals   Additional Long Term Goals Yes      PT LONG TERM GOAL #6   Title Pt will report no urge to urinate when sitting or bending  forward to put on his back pack  in order to attend meetings or leave the house    Time 8    Period Weeks    Status On-going      PT LONG TERM GOAL #7   Title Pt 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 Pt also have reported less pelvic pain with proper standing posture and past HEP.   Today, reassessed pelvic floor with rectal assessment which showed less mm tightness on L. When cued for anterior tilt of pelvis, pt reported less pain. Pt was educated on how to find anterior tilt in squats and standing. Pt was advanced to scapulothoracolumbar strengthening and techniques for proper squat but required cues for alignment. Pt gained more awareness of hip hinge and noticed no pain with proper technique when bending down to pick objects off floor. Pt continues to benefit from skilled PT    Examination-Activity Limitations Toileting;Sit    Stability/Clinical Decision Making Evolving/Moderate complexity    Rehab Potential Good    PT Frequency 1x / week    PT Duration Other (comment)   10   PT Treatment/Interventions Neuromuscular re-education;Patient/family education;Therapeutic exercise;Scar mobilization;Taping;Manual techniques;Moist Heat;Therapeutic activities;Gait training;Balance training    Consulted and Agree with Plan of Care Patient           Patient  will benefit from skilled therapeutic intervention in order to improve the following deficits and impairments:  Abnormal gait,Decreased coordination,Decreased  range of motion,Difficulty walking,Decreased endurance,Decreased safety awareness,Increased muscle spasms,Decreased balance,Decreased strength,Decreased mobility,Postural dysfunction,Improper body mechanics,Decreased scar mobility,Decreased activity tolerance,Increased fascial restricitons,Hypomobility  Visit Diagnosis: Chronic bilateral low back pain with left-sided sciatica  Pelvic pain  Abnormal posture  Other idiopathic scoliosis, thoracolumbar region  Urinary frequency     Problem List Patient Active Problem List   Diagnosis Date Noted   Urinary frequency 12/23/2019   Pelvic pain 12/23/2019   Low back pain 12/22/2019    Jerl Mina ,PT, DPT, E-RYT  06/30/2020, 3:01 PM  [place] [facility] SERVICES [address] [place], [place], [address] Phone: [phone]   Fax:  [phone]  Name: Arvle Grabe MRN: [id number] Date of Birth: Nov 14, 1970

## 2020-07-07 ENCOUNTER — Other Ambulatory Visit: Payer: Self-pay

## 2020-07-07 ENCOUNTER — Ambulatory Visit: Payer: 59 | Attending: Physician Assistant | Admitting: Physical Therapy

## 2020-07-07 DIAGNOSIS — R350 Frequency of micturition: Secondary | ICD-10-CM | POA: Diagnosis present

## 2020-07-07 DIAGNOSIS — G8929 Other chronic pain: Secondary | ICD-10-CM | POA: Insufficient documentation

## 2020-07-07 DIAGNOSIS — M4125 Other idiopathic scoliosis, thoracolumbar region: Secondary | ICD-10-CM | POA: Insufficient documentation

## 2020-07-07 DIAGNOSIS — R293 Abnormal posture: Secondary | ICD-10-CM | POA: Insufficient documentation

## 2020-07-07 DIAGNOSIS — M533 Sacrococcygeal disorders, not elsewhere classified: Secondary | ICD-10-CM | POA: Insufficient documentation

## 2020-07-07 DIAGNOSIS — R102 Pelvic and perineal pain: Secondary | ICD-10-CM

## 2020-07-07 DIAGNOSIS — M5442 Lumbago with sciatica, left side: Secondary | ICD-10-CM | POA: Insufficient documentation

## 2020-07-07 NOTE — Patient Instructions (Addendum)
To minimize increased peeing at night,  decrease water intake from 33 fl oz to 12 fl oz around 6-7:30pm time.    __  With Bridging exercises, focus the feet to avoid squeezing gluts  ___   http://www.groinpainclinic.co.uk/groin-pain/nerves-affected-in-chronic-groin-pain : citation     Citation: [employer].com.br  __  Cloyd Stagers SUPPRESION TECHNIQUES  ? These techniques are to be used to suppress those abnormally strong URGES to urinate, especially after you have already urinated < 1hr ago.  ? These steps do not have to be followed in order, and not all steps have to be used.   ? The purpose of these steps is to help you regain control of your bladder, to reduce the amount of urinary urgency, frequency, or leaking.  ? They take practice to master in controlling urgency.  Allow yourself to be okay with leaking when you first start practicing these steps. ? Practice these steps first at home, when you do not have to worry as much about leaking.  1. SIT DOWN.  Pressure on the pelvic floor inhibits the bladder.  Further pressure may help, such as sitting on a small rolled up towel. 2. Lengthening pelvic floor with 5 breaths  using elevator imagery.  Breathe in, feel pelvic floor lower to "basement" level by the end of the inhalation. Exhale, feel pelvic floor gradually move up to "1st floor" which is the neutral position of pelvic floor. At the end of exhalation, feel pelvic floor lift higher .  Do not do hard, maximal contractions, as this will quickly fatigue the muscles and cause leakage. Perform this 5 repetitions in this pattern.  3. BREATHE & STAY CALM.  Breathing slowly and remaining calm will inhibit your sympathetic nervous system, which will in turn calm the bladder.   4. DISTRACTION.  Sit with a project that will engage your mind.  Anything that works for you - reading, word puzzles, crochet, knitting, checking email, balancing the checkbook, and so  on. 5. VISUALIZATION.  Imagine that you are in a place/situation in which either you cannot or do not want to leave.  Examples: in a car and cannot stop; lying on a beach with a far walk to a restroom; at dinner with someone special.  If the urge persists after practicing these steps and feel you must go to the bathroom, then it is imperative that you . . . . ? Walk slowly and calmly to the bathroom ? Maintain calm breathing ? Refrain from undressing until you are standing over the toilet Rushing to the restroom will only encourage the strong bladder urges and leaking.  Again, the more you practice, the easier these steps will become.

## 2020-07-07 NOTE — Therapy (Signed)
[place] MAIN [facility] SERVICES [address] [place], [place], [address] Phone: [phone]   Fax:  [phone]  Physical Therapy Treatment  Patient Details  Name: Alec Reynolds MRN: [id number] Date of Birth: [birth year] Referring Provider (PT): Fosnight    Encounter Date: 07/07/2020   PT End of Session - 07/07/20 1357    Visit Number 21    Date for PT Re-Evaluation 07/19/20   PN 05/09/20   PT Start Time 1300    PT Stop Time 9357    PT Time Calculation (min) 58 min    Activity Tolerance Patient tolerated treatment well;No increased pain    Behavior During Therapy [facility] for tasks assessed/performed           No past medical history on file.  Past Surgical History:  Procedure Laterality Date  . ELBOW SURGERY Left   . WRIST SURGERY      There were no vitals filed for this visit.   Subjective Assessment - 07/07/20 1304    Subjective Pt reported walked last week. Pt was doing better.  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  Prior to teaching, pain was 5/10 and after teaching that day 7/10. Pt took a hot bath and stretched. Pain went down to 5/10.  In the evenings, stretching is causing him to pee more. In the morning, he drinks 33 fl oz of water, another 33 fl oz in the afternoon, 33 fl oz in the afternoon between 4:30-6pm, and drink another 33 fl oz at 6-7:30pm.  Pt goes to bed at 9:30pm.  Pain has gone down, urgency has gone up. Pt has not had leakage before making it to the bathroom.  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  Pt notices less pain in this area with the proper way to bend to pick items off floor.     Pertinent History R groin injury 2017R ,  anxiety, stress Fx in R shin, low back as kid. 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 Pt also enjoyed cycling and stopped when the pain started. Pt used to cycle 2-3 hours 200 miles / day. Pt did not have a stretch routine. Pt completed Pelvic PT via telehealth one year and tried stretching but it made it worse.    Patient Stated Goals sit down for 2 hours  without hurting, Not  have to feel like he has to pee all the time              [facility] PT Assessment - 07/07/20 1322      Observation/Other Assessments   Observations sitting without cues, not slouched, no posterior pelvis      Other:   Other/ Comments poor co-activation of feet in bridging exercises, concordant pain with poor form                      Pelvic Floor Special Questions - 07/07/20 1357    External Perineal Exam without undergarment: sensory test ( dull/ sharp) ( L/R) neg .  tightness / tenderness over pubic symphysis, deep transverse perineal, urethra compressor L             [facility] Adult PT  Treatment/Exercise - 07/07/20 1326      Therapeutic Activites    Other Therapeutic Activities urge suppression technique with breathing no contractions, discussed with anatomy pictures of neuroanatomy and mm of tightness. Discussed decreasing water intake prior to bed tominimize frequency at night     discussed ergonomic station , details next session     Neuro Re-ed    Neuro Re-ed Details  cued for more feet co-activation in bridging exercise and pelvic tilts      Manual Therapy   Internal Pelvic Floor STM/MWM at problem noted in assesment                       PT Long Term Goals - 07/07/20 1440      PT LONG TERM GOAL #1   Title Pt will demo decreased abdominal separation from 4 fingers width  to < 1 fingers width to improve IAP system for pelvic floor function    Time 4    Period Weeks    Status Achieved      PT LONG TERM GOAL #2   Title Pt will demo improved posture with more anterior tilt of pelvis, increased lumbar lordosis, and  no cues for proper sitting/ standing posture  in order to minimize CLBP and return to more functional activities    Time 6    Period Weeks    Status Achieved      PT LONG TERM GOAL #3   Title Pt will demo no spinal deviation and more levelled pelvic girdle across 2 visits in order to advance towards deep core/ thoracolumbar strengthening exercises    Time 2    Period Weeks    Status Achieved      PT LONG TERM GOAL #4   Title Pt will demo decreased R pelvic floor mm tightness across 2 visits in order to minimize pelvic pain and straining with bowel movements and optimize IAP system properly    Time 8    Period Weeks    Status Achieved      PT LONG TERM GOAL #5   Title Pt's FOTO score for Pain to decrease from 29pts to < 24pts and Urinary from 38 pts to < 30 pts and Bowel from 17 pts to < 12 pts and  Bowel Leakage improve 59 pts to > 64 pts    Baseline 11/8:Urinary  38 pts to 33 pts, PFDI Bowel 17pts to 4pts, bowel Leakage 59pts to 72 pts    Time 10    Period Weeks    Status Partially Met      PT LONG TERM GOAL #6   Title Pt will report no urge to urinate when sitting or bending forward to put on his back pack  in order to attend meetings or leave the house    Time 8    Period Weeks    Status On-going      PT LONG TERM GOAL #7   Title Pt 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 Pt required manual Tx to minimize L mm attachments to pubic symphysis and deep transverse perineal mm. Plan to address R hip  hypomobility next session as pt reported he has had injury there and notices more limited ROM when stretching his hips.   Addressed urgency today with modified urge suppression technique without activating contractions. Modified this way because pt needs more lengthening of pelvic floor, not tightening. Also discussed his water intake to minimize more frequency at night. Provided active listening and motivational interviewing to help pt acknowledge his flare ups are only involving 2 pts of change on the VAS scale and provided pt more encourage about his progress. Pt required cues for more feet co-activation to minimize posterior pelvic floor overactivity. Pt continues to benefit from skilled PT.     Examination-Activity Limitations Toileting;Sit    Stability/Clinical Decision Making Evolving/Moderate complexity    Rehab Potential Good    PT Frequency 1x / week    PT Duration Other (comment)   10   PT Treatment/Interventions Neuromuscular re-education;Patient/family education;Therapeutic exercise;Scar mobilization;Taping;Manual techniques;Moist Heat;Therapeutic activities;Gait training;Balance training    Consulted and Agree with Plan of Care Patient           Patient will benefit from skilled therapeutic intervention in order to improve the following deficits and impairments:  Abnormal gait,Decreased coordination,Decreased range of motion,Difficulty walking,Decreased endurance,Decreased safety awareness,Increased muscle spasms,Decreased balance,Decreased strength,Decreased mobility,Postural dysfunction,Improper body mechanics,Decreased scar mobility,Decreased activity tolerance,Increased fascial restricitons,Hypomobility  Visit Diagnosis: Abnormal posture  Sacrococcygeal disorders, not elsewhere classified  Chronic bilateral low back pain with left-sided sciatica  Pelvic pain  Other idiopathic scoliosis, thoracolumbar region  Urinary frequency     Problem List Patient Active Problem  List   Diagnosis Date Noted  . Urinary frequency 12/23/2019  . Pelvic pain 12/23/2019  . Low back pain 12/22/2019    Jerl Mina ,PT, DPT, E-RYT  07/07/2020, 2:40 PM  [place] MAIN [facility] SERVICES [address] [place], [place], [address] Phone: [phone]   Fax:  [phone]  Name: Surafel Hilleary MRN: [id number] Date of Birth: [birth year]

## 2020-07-14 ENCOUNTER — Ambulatory Visit: Payer: 59 | Admitting: Physical Therapy

## 2020-07-21 ENCOUNTER — Ambulatory Visit: Payer: 59 | Admitting: Physical Therapy

## 2020-07-21 DIAGNOSIS — M4125 Other idiopathic scoliosis, thoracolumbar region: Secondary | ICD-10-CM

## 2020-07-21 DIAGNOSIS — G8929 Other chronic pain: Secondary | ICD-10-CM

## 2020-07-21 DIAGNOSIS — M533 Sacrococcygeal disorders, not elsewhere classified: Secondary | ICD-10-CM

## 2020-07-21 DIAGNOSIS — R293 Abnormal posture: Secondary | ICD-10-CM | POA: Diagnosis not present

## 2020-07-21 DIAGNOSIS — R102 Pelvic and perineal pain: Secondary | ICD-10-CM

## 2020-07-21 DIAGNOSIS — R350 Frequency of micturition: Secondary | ICD-10-CM

## 2020-07-21 DIAGNOSIS — M5442 Lumbago with sciatica, left side: Secondary | ICD-10-CM

## 2020-07-21 NOTE — Patient Instructions (Signed)
Read over the OSA and nocturia articles emailed and contact your PCP re: getting screened for sleep study

## 2020-07-21 NOTE — Therapy (Addendum)
[place] MAIN [facility] SERVICES [address] [place], [place], [address] Phone: [phone]   Fax:  [phone]  Physical Therapy Treatment / Progress Note   Patient Details  Name: Alec Reynolds MRN: [id number] Date of Birth: [birth year] Referring Provider (PT): Fosnight    Encounter Date: 07/21/2020   PT End of Session - 07/21/20 1327    Visit Number 22    Date for PT Re-Evaluation 09/29/20   PN 05/09/20, 07/21/20   PT Start Time 1300    PT Stop Time 1400    PT Time Calculation (min) 60 min    Activity Tolerance Patient tolerated treatment well;No increased pain    Behavior During Therapy [facility] for tasks assessed/performed           No past medical history on file.  Past Surgical History:  Procedure Laterality Date  . ELBOW SURGERY Left   . WRIST SURGERY      There were no vitals filed for this visit.   Subjective Assessment - 07/21/20 1307    Subjective Pain is decreasing to 4/10 after nocturnal erections which is decreasing more and more. Pt wakes up in the middle of night with urge to urinate and then he notices he has a nocturnal erection.  Pt has had the urge to urinate in the middle of the night for 2 x per night and this started 4 years ago and he has not been tested for OSA. Pt wakes up with neck pain at the occiput ( pt points) and has a HA at both temples area for the past 4 years. His wife says he mouth breaths at night. 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 The urge increases during the day with sitting and stretching exercises and cold temperature, bending over. When he changes position and keep his mind on something else, the urge decreases or goes away. Pt used to rush with peeing and bowel movements and now is relaxing and making time to complete urination and bowel movements.   Pt feels he is moving in the right direction with his progress. Pt feels  encouraged to keep doing his exercises because he feels they help relieve the pelvic pain.  Pt is more often able to tolerate in different surfaces. Overall his bowel movements are better and is occur 1x a day instead of 2-3 x day or every 2-3 days and still following nutritional protocol.   Pelvic pain is better "most of the time".  Bowel movement improvement by 70%, urinary urgency 0%, pelvic pain L buttock 30%.  Pt is concerned about an area where he notices a few bumps locates by L side of the penis and pelvic floor that feels like" fibrous bumps, scars, nodules". It feels sharp when touching them. Pt has not told Dr. Jay Schlichter (medical provider) nor Dr. Geradine Girt (referring Pelvic PT) about these bumps.  MRI and [place] tests 03/2019  did not show anything irregular. Pt is still concerned about bumps because he thinks they are are cause of all his pain.  He has not noticed them to grow in size.      Pertinent History R groin injury 2017R ,  anxiety, stress Fx in R shin, low back as kid. 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 Pt also enjoyed cycling  and stopped when the pain started. Pt used to cycle 2-3 hours 200 miles / day. Pt did not have a stretch routine. Pt completed Pelvic PT via telehealth one year and tried stretching but it made it worse.    Patient Stated Goals sit down for 2 hours  without hurting, Not  have to feel like he has to pee all the time              [facility] PT Assessment - 07/22/20 1110      Observation/Other Assessments   Observations siting posture without cues for anterior tilt      Palpation   SI assessment  levelled pelvic girdle, spine without deviations                      Pelvic Floor Special Questions - 07/22/20 1110    External Perineal Exam without unergarment, pt atempted to find the "lump" by L testicle, unable to find it in supine, found it in standing. PT was not able to  palpate any raised nodule nor lump where his finger was placed by L testicle. Recommended pt to self-palpate on R side as well and gather data and information to bring back to next session ( activity rpior to noticing pain at this location, lump was palpated yes/no, date, time)             [facility] Adult PT Treatment/Exercise - 07/22/20 1112      Therapeutic Activites    Other Therapeutic Activities reassessed goals. readministered FOTO, disussed   active listening, biopsychosocial approaches     Neuro Re-ed    Neuro Re-ed Details  explained neuroscience to reeducation                       PT Long Term Goals - 07/21/20 1327      PT LONG TERM GOAL #1   Title Pt will demo decreased abdominal separation from 4 fingers width  to < 1 fingers width to improve IAP system for pelvic floor function    Time 4    Period Weeks    Status Achieved      PT LONG TERM GOAL #2   Title Pt will demo improved posture with more anterior tilt of pelvis, increased lumbar lordosis, and no cues for proper sitting/ standing posture  in order to minimize CLBP and return to more functional activities    Time 6    Period Weeks    Status Achieved      PT LONG TERM GOAL #3   Title Pt will demo no spinal deviation and more levelled pelvic girdle across 2 visits in order to advance towards deep core/ thoracolumbar strengthening exercises    Time 2    Period Weeks    Status Achieved      PT LONG TERM GOAL #4   Title Pt will demo decreased R pelvic floor mm tightness across 2 visits in order to minimize pelvic pain and straining with bowel movements and optimize IAP system properly    Time 8    Period Weeks    Status Achieved      PT LONG TERM GOAL #5   Title Pt's FOTO score for Pain to decrease from 29pts to < 24pts and Urinary from 38 pts to < 30 pts and Bowel from 17 pts to < 12 pts and  Bowel Leakage improve 59 pts to > 64 pts    Baseline 11/8:Urinary  38 pts to 33 pts, PFDI Bowel 17pts to  4pts, bowel Leakage 59pts to 72 pts   07/21/20:    Time 10    Period Weeks    Status Partially Met      PT LONG TERM GOAL #6   Title Pt will report no urge to urinate when sitting or bending forward to put on his back pack  in order to attend meetings or leave the house    Time 8    Period Weeks    Status On-going      PT LONG TERM GOAL #7   Title Pt 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    Time 10    Period Weeks    Status New    Target Date 08/18/20                 Plan - 07/22/20 1114    Clinical Impression Statement Pt achieved 4/9 goals and is progressing well towards remaining goals.  Functional improvements to his pelvic floor include: -Pt's bowel frequency and leakage have resolved -Pt is able to tolerate sitting on variable chair surfaces.  -Nocturia erections ( partial and full) have returned  Pt's subjective report of his improvement include: -Pelvic pain is better "most of the time".  -Bowel movement improvement by 70% -Urinary urgency 0%, pelvic pain L buttock 30%  Pt has benefited greatly from a regional interdependant and biopsychosocial approach. Physical therapist has communicated with his nutritionist, referring pelvic PT, psychotherapist.   His PT improvements include : - restored equal alignment of pelvic girdle with shoe lift in R shoe to accommodate for shorter leg length  -diastasis recti resolved  -rounded shoulders/ forward head, asymmetrical mm tightness on LLE/ pelvic floor improved  -significantly decreased  tightness of mm of back, LLE, pelvic floor mm and improved coordination of deep core mm, particularly to lengthen and relax them  -increased propioception of pelvic tilt for improved upright sitting and standing posture  -gradually increasing global mm strength of multidifis, posterior thoracolumbar/ glut strength to minimize relapse of foreward head/ slumped posture and 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  Pt benefits from continued pelvic floor PT with focus on improving urinary urgency and pelvic pain. Today, provided more neuroscience reeducation and plan to utilize graded movement, mirror therapy, and sensory retraining at upcoming sessions.   Today, reinforced  the importance of getting screened for OSA given his Hx of long standing his nocturia episodes which is related to his report of his urge to urinate 2-3x  in the middle of the night despite minimizing intake of water in the later evening. Pt voiced agreement. Research articles about the correlation of OSA and nocturia were provided to pt and explanation was given. Therapist plans to call his PCP re: a request for sleep study. Pt's urgency Sx that occur in the daytime is related to sitting, stretching exercises, and cold temperature, and bending over. Further sessions will be focused on troubleshooting these associated activities.  Today's focus also included addressing  his concern about an area where he notices a few bumps located by L side of the penis and pelvic floor that feels like" fibrous bumps, scars, nodules". It feels sharp when touching them.  Pt has not told Dr. Jay Schlichter (medical provider) nor Dr. Geradine Girt (referring Pelvic PT) about these bumps.  MRI and [place] tests 03/2019  did not show anything irregular. Pt is still concerned about bumps because he thinks they are are cause of all his pain.  He has not noticed them to grow in size.   In today's assessment, therapist did not notice any nodule nor raised bumps in the area where pt placed his finger in the standing position after he was able to locate it. Advised pt to continue to monitor this area and compare with the R. Plan to reassess with patient at next week's session. Plan to refer ro a local urologist for further imaging.            Examination-Activity Limitations Toileting;Sit    Stability/Clinical Decision Making Evolving/Moderate complexity    Rehab Potential Good    PT Frequency 1x / week    PT Duration Other (comment)   10   PT Treatment/Interventions Neuromuscular re-education;Patient/family education;Therapeutic exercise;Scar mobilization;Taping;Manual techniques;Moist Heat;Therapeutic activities;Gait training;Balance training    Consulted and Agree with Plan of Care Patient           Patient will benefit from skilled therapeutic intervention in order to improve the following deficits and impairments:  Abnormal gait,Decreased coordination,Decreased range of motion,Difficulty walking,Decreased endurance,Decreased safety awareness,Increased muscle spasms,Decreased balance,Decreased strength,Decreased mobility,Postural dysfunction,Improper body mechanics,Decreased scar mobility,Decreased activity tolerance,Increased fascial restricitons,Hypomobility  Visit Diagnosis: Sacrococcygeal disorders, not elsewhere classified  Chronic bilateral low back pain with left-sided  sciatica  Pelvic pain  Abnormal posture  Other idiopathic scoliosis, thoracolumbar region  Urinary frequency     Problem List Patient Active Problem List   Diagnosis Date Noted  . Urinary frequency 12/23/2019  . Pelvic pain 12/23/2019  . Low back pain 12/22/2019    Jerl Mina ,PT, DPT, E-RYT  07/22/2020, 11:15 AM  [place] MAIN [facility] SERVICES [address] [place], [place], [address] Phone: [phone]   Fax:  [phone]  Name: Brookes Craine MRN: [id number] Date of Birth: [birth year]

## 2020-07-22 ENCOUNTER — Telehealth: Payer: Self-pay | Admitting: Physical Therapy

## 2020-07-22 NOTE — Telephone Encounter (Signed)
DPT left message with staff at [facility] ( PCP) re: a request for sleep study to rule in/out OSA given his Hx of nocturia 2-3 x a night for past 4 years and wakes up with HA and neck pain.

## 2020-07-22 NOTE — Addendum Note (Signed)
Addended by: Mariane Masters on: 07/22/2020 12:59 PM   Modules accepted: Orders

## 2020-07-28 ENCOUNTER — Ambulatory Visit: Payer: 59 | Admitting: Physical Therapy

## 2020-07-28 ENCOUNTER — Other Ambulatory Visit: Payer: Self-pay

## 2020-07-28 DIAGNOSIS — R102 Pelvic and perineal pain: Secondary | ICD-10-CM

## 2020-07-28 DIAGNOSIS — R293 Abnormal posture: Secondary | ICD-10-CM

## 2020-07-28 DIAGNOSIS — M533 Sacrococcygeal disorders, not elsewhere classified: Secondary | ICD-10-CM

## 2020-07-28 DIAGNOSIS — G8929 Other chronic pain: Secondary | ICD-10-CM

## 2020-07-28 DIAGNOSIS — R350 Frequency of micturition: Secondary | ICD-10-CM

## 2020-07-28 DIAGNOSIS — M4125 Other idiopathic scoliosis, thoracolumbar region: Secondary | ICD-10-CM

## 2020-07-28 NOTE — Patient Instructions (Signed)
Read Why Do I still Hurt on neuroscience education   __ Water:    Drink 100 instead of 128 fl  per day    25 fl oz  (8 fl oz plain water without fiber upon waking )   5am 9am 25 fl oz  9-noon  25 fl oz  Noon- 3pm 25 fl oz  3pm- 6pm  __  Every 2 hours to eliminate Do not override the signal to go just to finish a class  __

## 2020-07-29 NOTE — Therapy (Addendum)
[place] MAIN [facility] SERVICES [address] [place], [place], [address] Phone: [phone]   Fax:  [phone]  Physical Therapy Treatment  Patient Details  Name: Alec Reynolds MRN: [id number] Date of Birth: 1970/07/26 Referring Provider (PT): Fosnight    Encounter Date: 07/28/2020   PT End of Session - 07/28/20 1420    Visit Number 23    Date for PT Re-Evaluation 09/29/20   PN 05/09/20, 07/21/20   PT Start Time 1300    PT Stop Time 1420    PT Time Calculation (min) 80 min    Activity Tolerance Patient tolerated treatment well;No increased pain    Behavior During Therapy [facility] for tasks assessed/performed           No past medical history on file.  Past Surgical History:  Procedure Laterality Date  . ELBOW SURGERY Left   . WRIST SURGERY      There were no vitals filed for this visit.   Subjective Assessment - 07/28/20 1306    Subjective Pt noticed he had a full firm erection in the morning which is good because in the past, he felt the erections would more pain and he tells himself that his body is going what it is supposed. Nocturnal erections have not occurred for many years. He recognizes this is a sign of his body being more relaxed. The pain is decreasing in L lower side of his penis. The pain in ischiotuberosity area  and the area where he has noticed the area around a  lump on L side of the base of his penis  increases after erections and lasts the whole day. Pain level after the erection 6/10 and end of the day 6/10. Standing up for 3 hours have also caused the pain to increased and he feels his leg muscles more fatigue. Pt notices when he stretches his quads, the pain decreases more than if he stretched his hamstrings.  Pt has noticed when he is pushing on the lump, the area around it becomes painful.   more recently at the end of Dec but he did not speak about this lump to his providers at this time. Pt noticed this area hurting  after erections, standing for long periods, sitting hurting. This area radiates to the penis and to the back like it expands.  Urinary frequency has been worse across 2 days last week.  Bowel movements no longer hurt. 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 Pt was "really freaking out mentally in the summer and now he has calmed down. Now he feels the pain but it is not where it was" . Pt reports he realizes "he is a mega tight-ass". Pt "emotionally and mentally understands that that his brain gets super anxious and initiating/ adding to the symptoms, plus work, heat, work load, plus, plus". Pt is trying to "accept what his body wants to do". Pt recognizes he all his life, he has been pushing through and he realizes it is not working with his Sx.      Pertinent History R groin injury 2017R ,  anxiety, stress Fx in R shin, low back as kid. 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 Pt also enjoyed cycling and stopped when the pain started. Pt used to cycle 2-3 hours 200 miles /  day. Pt did not have a stretch routine. Pt completed Pelvic PT via telehealth one year and tried stretching but it made it worse.    Patient Stated Goals sit down for 2 hours  without hurting, Not  have to feel like he has to pee all the time               Assessment: noted:  slightly slumped posture , leaning onto knees L/R      standing posture: no hyperextension of knees, no slumped posture              [facility] Adult PT Treatment/Exercise - 07/29/20 1302      Therapeutic Activites    Other Therapeutic Activities discussed bladeer retraining, referral to urologist to adderess the lump he reported      Neuro Re-ed    Neuro Re-ed Details  neuroscience retraining , biopsychosocial approaches                       PT Long Term Goals - 07/21/20 1327       PT LONG TERM GOAL #1   Title Pt will demo decreased abdominal separation from 4 fingers width  to < 1 fingers width to improve IAP system for pelvic floor function    Time 4    Period Weeks    Status Achieved      PT LONG TERM GOAL #2   Title Pt will demo improved posture with more anterior tilt of pelvis, increased lumbar lordosis, and no cues for proper sitting/ standing posture  in order to minimize CLBP and return to more functional activities    Time 6    Period Weeks    Status Achieved      PT LONG TERM GOAL #3   Title Pt will demo no spinal deviation and more levelled pelvic girdle across 2 visits in order to advance towards deep core/ thoracolumbar strengthening exercises    Time 2    Period Weeks    Status Achieved      PT LONG TERM GOAL #4   Title Pt will demo decreased R pelvic floor mm tightness across 2 visits in order to minimize pelvic pain and straining with bowel movements and optimize IAP system properly    Time 8    Period Weeks    Status Achieved      PT LONG TERM GOAL #5   Title Pt's FOTO score for Pain to decrease from 29pts to < 24pts and Urinary from 38 pts to < 30 pts and Bowel from 17 pts to < 12 pts and  Bowel Leakage improve 59 pts to > 64 pts    Baseline 11/8:Urinary  38 pts to 33 pts, PFDI Bowel 17pts to 4pts, bowel Leakage 59pts to 72 pts   07/21/20:    Time 10    Period Weeks    Status Partially Met      PT LONG TERM GOAL #6   Title Pt will report no urge to urinate when sitting or bending forward to put on his back pack  in order to attend meetings or leave the house    Time 8    Period Weeks    Status On-going      PT LONG TERM GOAL #7   Title Pt 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    Time 10    Period Weeks    Status New    Target Date 08/18/20                   Plan - 07/29/20 1302    Clinical Impression Statement Pt provided neuroscience reeducation today and discussed referral to urologist to get updated medical workup given pt's c/o lump that radiates pain to anterior and posterior pelvic foor on L side.  Pt has noticed when he is pushing on the lump, the area around it becomes painful.   more recently at the end of Dec but he did not speak about this lump to his medical providers at this time. Pt noticed this area hurting after erections, standing for long periods, sitting hurting. This area radiates to the penis and to the back like it expands.  Urinary frequency has been worse across 2 days last week.    Therapist plans to communicate with urologist and update her on his past Pelvic PT POC and improvements and remaining issues.   Plan to continue with pain science reeducation and progress with strengthrning exercises at next session Pt benefits from skilled PT    Examination-Activity Limitations Toileting;Sit    Stability/Clinical Decision Making Evolving/Moderate complexity    Rehab Potential Good    PT Frequency 1x / week    PT Duration Other (comment)   10   PT Treatment/Interventions Neuromuscular re-education;Patient/family education;Therapeutic exercise;Scar mobilization;Taping;Manual techniques;Moist Heat;Therapeutic activities;Gait training;Balance training    Consulted and Agree with Plan of Care Patient           Patient will benefit from skilled therapeutic intervention in order to improve the following deficits and impairments:  Abnormal gait,Decreased coordination,Decreased range of motion,Difficulty walking,Decreased endurance,Decreased safety  awareness,Increased muscle spasms,Decreased balance,Decreased strength,Decreased mobility,Postural dysfunction,Improper body mechanics,Decreased scar mobility,Decreased activity tolerance,Increased fascial restricitons,Hypomobility  Visit Diagnosis: Sacrococcygeal disorders, not elsewhere classified  Chronic bilateral low back pain with left-sided sciatica  Pelvic pain  Abnormal posture  Other idiopathic scoliosis, thoracolumbar region  Urinary frequency     Problem List Patient Active Problem List   Diagnosis Date Noted  . Urinary frequency 12/23/2019  . Pelvic pain 12/23/2019  . Low back pain 12/22/2019    Jerl Mina ,PT, DPT, E-RYT  07/29/2020, 1:02 PM  [place] MAIN [facility] SERVICES [address] [place], [place], [address] Phone: [phone]   Fax:  [phone]  Name: Alec Reynolds MRN: [id number] Date of Birth: Nov 12, 1970

## 2020-08-01 NOTE — Addendum Note (Signed)
Addended by: Mariane Masters on: 08/01/2020 08:50 AM   Modules accepted: Orders

## 2020-08-02 ENCOUNTER — Encounter: Payer: BC Managed Care – PPO | Admitting: Physical Therapy

## 2020-08-03 ENCOUNTER — Other Ambulatory Visit: Payer: Self-pay

## 2020-08-03 ENCOUNTER — Ambulatory Visit (INDEPENDENT_AMBULATORY_CARE_PROVIDER_SITE_OTHER): Payer: 59 | Admitting: Urology

## 2020-08-03 ENCOUNTER — Encounter: Payer: Self-pay | Admitting: Urology

## 2020-08-03 VITALS — BP 108/68 | HR 99 | Ht 76.0 in | Wt 201.0 lb

## 2020-08-03 DIAGNOSIS — G8929 Other chronic pain: Secondary | ICD-10-CM | POA: Diagnosis not present

## 2020-08-03 DIAGNOSIS — R102 Pelvic and perineal pain: Secondary | ICD-10-CM

## 2020-08-03 DIAGNOSIS — N4889 Other specified disorders of penis: Secondary | ICD-10-CM

## 2020-08-03 DIAGNOSIS — M6289 Other specified disorders of muscle: Secondary | ICD-10-CM | POA: Diagnosis not present

## 2020-08-03 DIAGNOSIS — R350 Frequency of micturition: Secondary | ICD-10-CM

## 2020-08-03 NOTE — Progress Notes (Signed)
08/03/2020 2:57 PM   Alec Reynolds Oct 22, 1970 [id number]  Referring provider: Ileana Ladd, MD [address] [place],  [place] [address]  Chief Complaint  Patient presents with  . penile mass    HPI: [age] year old male with chronic pelvic pain who presents today for evaluation of possible penile mass.  He presents today with a 5 page summary which is scanned into the chart outlining the past several years as relates to his diagnosis and management of his chronic pelvic pain.  In summary, his symptoms began to acutely worsen in 2019 associated with significant worsening urinary symptoms, penile pain, urgency frequency and testicular pain along with perineal pain radiating to his rectum.  He was seen and evaluated now by multiple urologist in practitioners, initially at [facility] urology in [place] who was treated for presumed prostatitis and Flomax.  He then started seeing a urologist in [place], Dr. Leata Mouse who unfortunately passed suddenly.  He underwent shockwave therapy and some sort of vibrating tens chair which only exacerbated his symptoms.  He is also had scrotal ultrasound which was fairly unremarkable as well as a pelvic MRI which was also negative.  More recently, he started seeing [facility] for sexual health in [place].  At that point, he was diagnosed with "pudendal neuralgia" and additional treatments including trigger point injections and Botox were recommended to be considered but he ultimately declined these.  Most recently, he is under the care of [facility] here at [facility] and is making great strides in facilitating pelvic floor relaxation, improvement in his urinary symptoms.  He is also had improvement in his erectile function.  He has been referred for a hard "gristle-like lump" near his urethra on the left side in his perineum.  His urinary symptoms seem to be exacerbated, possibly by the cold weather.  He has noticed the  mass about a month ago which she also describes as scarring".  This is remained stable in size.  No drainage, redness or fluctuance of the area.  He is currently on multiple medications none of which she was on prior to 2 years ago.  These medications include a daily dose of 1500 mg of gabapentin, 100 mg of sertraline, diazepam suppository 15 mg nightly to help with sleep amongst other supplements.   PMH: No past medical history on file.  Surgical History: Past Surgical History:  Procedure Laterality Date  . ELBOW SURGERY Left   . WRIST SURGERY      Home Medications:  Allergies as of 08/03/2020   No Known Allergies     Medication List       Accurate as of August 03, 2020  2:57 PM. If you have any questions, ask your nurse or doctor.        STOP taking these medications   fluticasone 50 MCG/ACT nasal spray Commonly known as: FLONASE Stopped by: Vanna Scotland, MD   ibuprofen 200 MG tablet Commonly known as: ADVIL Stopped by: Vanna Scotland, MD     TAKE these medications   gabapentin 300 MG capsule Commonly known as: NEURONTIN SMARTSIG:1 By Mouth 4-5 Times Daily What changed: Another medication with the same name was removed. Continue taking this medication, and follow the directions you see here. Changed by: Vanna Scotland, MD       Allergies: No Known Allergies  Family History: Family History  Problem Relation Age of Onset  . Stroke Father 6    Social History:  reports that he has never smoked. He has  never used smokeless tobacco. He reports current alcohol use. He reports that he does not use drugs.   Physical Exam: BP 108/68   Pulse 99   Ht 6\' 4"  (1.93 m)   Wt 201 lb (91.2 kg)   BMI 24.47 kg/m   Constitutional:  Alert and oriented, No acute distress. HEENT: [place] AT, moist mucus membranes.  Trachea midline, no masses. Cardiovascular: No clubbing, cyanosis, or edema. Respiratory: Normal respiratory effort, no increased work of breathing. GI: Abdomen  is soft, nontender, nondistended, no abdominal masses GU: Very careful examination of his scrotum indicate normal bilateral descended testicles, nontender, no nodules.  He is a circumcised phallus which is palpably normal with an orthotopic patent meatus without discharge.  Careful examination of the perineum indicate no obvious pathology.  He points to an area of the ball of his penis adjacent to his urethra as the point of tenderness.  This anatomy is symmetric bilaterally and anatomically benign.  No inguinal adenopathy. Declined rectal Skin: No rashes, bruises or suspicious lesions. Neurologic: Grossly intact, no focal deficits, moving all 4 extremities. Psychiatric: Normal mood and affect.  Laboratory Data: Lab Results  Component Value Date   WBC 8.9 12/14/2016   HGB 15.8 12/14/2016   HCT 47.3 12/14/2016   MCV 89.4 12/14/2016   PLT 248 12/14/2016    Lab Results  Component Value Date   CREATININE 1.00 12/14/2016   Urinalysis UA today is negative, no blood or evidence of infection   Assessment & Plan:    1. Penile mass Exam today was benign, patient was reassured.  Suspect he is palpating the base of his penis and his perineum which is anatomically normal.  Urinalysis is also negative today which is also reassuring.  Extremely low suspicion for any significant GU pathology including fistula, infection, malignancy or neoplasm in the setting of above.  We did discuss the role of possible cystoscopy today to rule out any stricture disease or urethral pathology but in light of a negative urine and absence of spraying of his urinary stream and other such symptoms, this is also likely very low yield and may exacerbate his chronic symptoms.  He will think about let 12/16/2016 know if he like to pursue this.   2. Chronic pelvic pain in male Under excellent care of our local pelvic floor therapist, strongly recommend continuing his treatments as he is improving overall from a sexual  standpoint, urinary standpoint as well as chronic pain perspective  I do think he would benefit from a specialist in male chronic pelvic pain locally especially to manage his pharmacotherapy as this is fairly significant high-dose medication which he does mention can make him foggy/sedated.  We will ReachOut to a few colleagues for optimal referral here and get back to the patient.  3. Pelvic floor dysfunction Suspect urinary symptoms secondary to pelvic floor dysfunction, continue PT  The fact that anxiety and stress exacerbate the symptoms as well as cold go along with this working diagnosis.  He is acutely aware of this and will continue to work on relaxation techniques at the time of urination. - Urinalysis, Complete   US, MD  [facility] [address] [place], [place] [phone]   I spent 60 total minutes on the day of the encounter including pre-visit review of the medical record, face-to-face time with the patient, and post visit ordering of labs/imaging/tests.

## 2020-08-04 ENCOUNTER — Ambulatory Visit: Payer: 59 | Attending: Physician Assistant | Admitting: Physical Therapy

## 2020-08-04 DIAGNOSIS — M533 Sacrococcygeal disorders, not elsewhere classified: Secondary | ICD-10-CM

## 2020-08-04 DIAGNOSIS — R293 Abnormal posture: Secondary | ICD-10-CM | POA: Diagnosis not present

## 2020-08-04 DIAGNOSIS — R350 Frequency of micturition: Secondary | ICD-10-CM | POA: Diagnosis present

## 2020-08-04 DIAGNOSIS — G8929 Other chronic pain: Secondary | ICD-10-CM

## 2020-08-04 DIAGNOSIS — M4125 Other idiopathic scoliosis, thoracolumbar region: Secondary | ICD-10-CM | POA: Insufficient documentation

## 2020-08-04 DIAGNOSIS — R102 Pelvic and perineal pain: Secondary | ICD-10-CM | POA: Diagnosis present

## 2020-08-04 DIAGNOSIS — M5442 Lumbago with sciatica, left side: Secondary | ICD-10-CM | POA: Insufficient documentation

## 2020-08-04 LAB — MICROSCOPIC EXAMINATION
Bacteria, UA: NONE SEEN
Epithelial Cells (non renal): NONE SEEN /hpf (ref 0–10)
RBC, Urine: NONE SEEN /hpf (ref 0–2)
WBC, UA: NONE SEEN /hpf (ref 0–5)

## 2020-08-04 LAB — URINALYSIS, COMPLETE
Bilirubin, UA: NEGATIVE
Glucose, UA: NEGATIVE
Ketones, UA: NEGATIVE
Leukocytes,UA: NEGATIVE
Nitrite, UA: NEGATIVE
Protein,UA: NEGATIVE
RBC, UA: NEGATIVE
Specific Gravity, UA: 1.015 (ref 1.005–1.030)
Urobilinogen, Ur: 0.2 mg/dL (ref 0.2–1.0)
pH, UA: 5.5 (ref 5.0–7.5)

## 2020-08-04 NOTE — Patient Instructions (Signed)
Return to gratitude

## 2020-08-05 NOTE — Therapy (Signed)
[place] MAIN [facility] SERVICES [address] [place], [place], [address] Phone: [phone]   Fax:  [phone]  Physical Therapy Treatment  Patient Details  Name: Alec Reynolds MRN: [id number] Date of Birth: July 02, 1971 Referring Provider (PT): Fosnight    Encounter Date: 08/04/2020   PT End of Session - 08/04/20 1309    Visit Number 24    Date for PT Re-Evaluation 09/29/20   PN 05/09/20, 07/21/20   PT Start Time 1300    PT Stop Time 1410    PT Time Calculation (min) 70 min    Activity Tolerance Patient tolerated treatment well;No increased pain    Behavior During Therapy [facility] for tasks assessed/performed           No past medical history on file.  Past Surgical History:  Procedure Laterality Date  . ELBOW SURGERY Left   . WRIST SURGERY      There were no vitals filed for this visit.   Subjective Assessment - 08/04/20 1306    Subjective Pt went for a consult with urologist as recommended by therapist. Pt reported all findings were cleared. Pt has contacted his MD for sleep study and waiting to hear back from sleep clinic. Pt feels frustrated why his Sx are dialed up. Pt came to the conclusion that this would not be a quick fix. Pt wants to do everything in his power to help it get better. Pt stated that he "recognizes that the pain in the area of his body reinforces the negative experiences that he ran away from or pushed to the back of his head. "   Pertinent History R groin injury 2017R ,  anxiety, stress Fx in R shin, low back as kid. 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 Pt also enjoyed cycling and stopped when the pain started. Pt used to cycle 2-3 hours 200 miles / day. Pt did not have a stretch routine. Pt completed Pelvic PT via telehealth one year and tried stretching but it made it worse.    Patient Stated Goals sit down for 2 hours  without hurting, Not   have to feel like he has to pee all the time              [facility] PT Assessment - 08/05/20 0900      Observation/Other Assessments   Observations seated posture, leaning onto R thigh or L thigh, slight posterior tilt of pelvis. Standing posture with anterior tilt of pelvis , no forward head posture                         [facility] Adult PT Treatment/Exercise - 08/05/20 0900      Therapeutic Activites    Other Therapeutic Activities biopsychosocial approaches , active listening, motivational interviewing                       PT Long Term Goals - 07/21/20 1327      PT LONG TERM GOAL #1   Title Pt will demo decreased abdominal separation from 4 fingers width  to < 1 fingers width to improve IAP system for pelvic floor function    Time 4    Period Weeks    Status Achieved      PT LONG TERM GOAL #2   Title Pt will demo improved posture with more  anterior tilt of pelvis, increased lumbar lordosis, and no cues for proper sitting/ standing posture  in order to minimize CLBP and return to more functional activities    Time 6    Period Weeks    Status Achieved      PT LONG TERM GOAL #3   Title Pt will demo no spinal deviation and more levelled pelvic girdle across 2 visits in order to advance towards deep core/ thoracolumbar strengthening exercises    Time 2    Period Weeks    Status Achieved      PT LONG TERM GOAL #4   Title Pt will demo decreased R pelvic floor mm tightness across 2 visits in order to minimize pelvic pain and straining with bowel movements and optimize IAP system properly    Time 8    Period Weeks    Status Achieved      PT LONG TERM GOAL #5   Title Pt's FOTO score for Pain to decrease from 29pts to < 24pts and Urinary from 38 pts to < 30 pts and Bowel from 17 pts to < 12 pts and  Bowel Leakage improve 59 pts to > 64 pts    Baseline 11/8:Urinary  38 pts to 33 pts, PFDI Bowel 17pts to 4pts, bowel Leakage 59pts to 72 pts   07/21/20:     Time 10    Period Weeks    Status Partially Met      PT LONG TERM GOAL #6   Title Pt will report no urge to urinate when sitting or bending forward to put on his back pack  in order to attend meetings or leave the house    Time 8    Period Weeks    Status On-going      PT LONG TERM GOAL #7   Title 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    Time 10    Period Weeks    Status New    Target Date 08/18/20                 Plan - 08/04/20 1310    Clinical Impression Statement Pt f/u with a consult with a urologist that therapist referred him to in order to assess the "lumps" that pt has noticed by his L testicle.  Urologist did not find anything malignant would like to refer him to a pain specialist.   Pt expressed frustration about the increased pain and frequency the past 2 weeks. Through motivational interviewing, pt was asked about whether he had goals for sexual function given he has now achieved nocturnal erections which is something he had not experienced in a long time. Pt explained about past Hx of trauma and negative experiences. Pt stated that he "recognizes that the pain in the area of his body reinforces the negative experiences that he ran away  from or pushed to the back of his head. "  Pt continues to work with his psychotherapist.   Today,  pt was provided biopsychosocial approaches and encouraged pt read the "Why Do I hurt" by Donavan Foil, DPT, Ph D and will discuss neuroscience of pain at upcoming sessions. Also plan to advance his resistance band  and strengthening exercises.   Pt also f/u with his PCP as recommended to get sleep study given his nocturia episodes. Pt is waiting to hear back from the sleep clinic. Dr. Jacelyn Grip ( PCP) has referred him to an acupuncturist.  Both urologist and DPT agree acupuncturist can be  beneficial for pt.   Pt continues to benefit from skilled PT    Examination-Activity Limitations Toileting;Sit    Stability/Clinical Decision Making Evolving/Moderate complexity    Rehab Potential Good    PT Frequency 1x / week    PT Duration Other (comment)   10   PT Treatment/Interventions Neuromuscular re-education;Patient/family education;Therapeutic exercise;Scar mobilization;Taping;Manual techniques;Moist Heat;Therapeutic activities;Gait training;Balance training    Consulted and Agree with Plan of Care Patient           Patient will benefit from skilled therapeutic intervention in order to improve the following deficits and impairments:  Abnormal gait,Decreased coordination,Decreased range of motion,Difficulty walking,Decreased endurance,Decreased safety awareness,Increased muscle spasms,Decreased balance,Decreased strength,Decreased mobility,Postural dysfunction,Improper body mechanics,Decreased scar mobility,Decreased activity tolerance,Increased fascial restricitons,Hypomobility  Visit Diagnosis: Abnormal posture  Sacrococcygeal disorders, not elsewhere classified  Chronic bilateral low back pain with left-sided sciatica  Pelvic pain  Urinary frequency     Problem List Patient Active Problem List   Diagnosis Date Noted  . Urinary frequency 12/23/2019  . Pelvic pain 12/23/2019  . Low back pain 12/22/2019    Jerl Mina 08/05/2020, 9:05 AM  [place] MAIN [facility] SERVICES [address] [place], [place], [address] Phone: [phone]   Fax:  [phone]  Name: Richey Doolittle MRN: [id number] Date of Birth: 06/19/1971

## 2020-08-09 ENCOUNTER — Encounter: Payer: BC Managed Care – PPO | Admitting: Physical Therapy

## 2020-08-11 ENCOUNTER — Other Ambulatory Visit: Payer: Self-pay

## 2020-08-11 ENCOUNTER — Ambulatory Visit: Payer: 59 | Admitting: Physical Therapy

## 2020-08-11 DIAGNOSIS — M5442 Lumbago with sciatica, left side: Secondary | ICD-10-CM

## 2020-08-11 DIAGNOSIS — R102 Pelvic and perineal pain: Secondary | ICD-10-CM

## 2020-08-11 DIAGNOSIS — G8929 Other chronic pain: Secondary | ICD-10-CM

## 2020-08-11 DIAGNOSIS — R293 Abnormal posture: Secondary | ICD-10-CM | POA: Diagnosis not present

## 2020-08-11 DIAGNOSIS — R350 Frequency of micturition: Secondary | ICD-10-CM

## 2020-08-11 DIAGNOSIS — M4125 Other idiopathic scoliosis, thoracolumbar region: Secondary | ICD-10-CM

## 2020-08-11 DIAGNOSIS — M533 Sacrococcygeal disorders, not elsewhere classified: Secondary | ICD-10-CM

## 2020-08-11 NOTE — Therapy (Signed)
[place] MAIN [facility] SERVICES [address] [place], [place], [address] Phone: [phone]   Fax:  [phone]  Physical Therapy Treatment  Patient Details  Name: Alec Reynolds MRN: [id number] Date of Birth: 07/30/1970 Referring Provider (PT): Fosnight    Encounter Date: 08/11/2020   PT End of Session - 08/11/20 1551    Visit Number 25    Date for PT Re-Evaluation 09/29/20   PN 05/09/20, 07/21/20   PT Start Time 1300    PT Stop Time 1400    PT Time Calculation (min) 60 min    Activity Tolerance Patient tolerated treatment well;No increased pain    Behavior During Therapy [facility] for tasks assessed/performed           No past medical history on file.  Past Surgical History:  Procedure Laterality Date  . ELBOW SURGERY Left   . WRIST SURGERY      There were no vitals filed for this visit.   Subjective Assessment - 08/11/20 1317    Subjective Pt reported he felt better after last week and reflected on letting go with the worry whether and when he will get better.   Pt  started a journal documenting positive days as recommended. Last week pt worked 9-3pm and then experienced less pain, less frequency, less urinating.  Also noticed no urge to urinate when leaning over the sink when brushing teeth. Pt 's noticed urge to uriante in teh morning was not as high.  Pt started acupucture yesterday. Pt noticed after the Tx, the pelvic pain was lower and throughout the day he was more relaxed and not hurting as much.  Pt stated he is determined to make time for his appointments and self-care.    Pertinent History R groin injury 2017R ,  anxiety, stress Fx in R shin, low back as kid. 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 Pt also enjoyed cycling and stopped when the pain started. Pt used to cycle 2-3 hours 200 miles / day. Pt did not have a stretch routine. Pt completed Pelvic PT via  telehealth one year and tried stretching but it made it worse.    Patient Stated Goals sit down for 2 hours  without hurting, Not  have to feel like he has to pee all the time              [facility] PT Assessment - 08/11/20 1551      Observation/Other Assessments   Observations posterior COM in upright standing position with lumbar lordosis,. knee alignment IR in warrior II, extended side angle pose . Brighter affect                         [facility] Adult PT Treatment/Exercise - 08/11/20 1553      Therapeutic Activites    Other Therapeutic Activities reviewed biopsychosocial principles and how pt is addressing each sector with new strategies , explained the upcoming sessions objective with yoga  practice that is customized for pt's deficits and areas of focus for pain management   encouragement with acupucture and [facility] Healing Touch services offered through [facility]     Neuro Re-ed    Neuro Re-ed Details  cued for alignment and details to standing postures ,  PT Long Term Goals - 07/21/20 1327      PT LONG TERM GOAL #1   Title Pt will demo decreased abdominal separation from 4 fingers width  to < 1 fingers width to improve IAP system for pelvic floor function    Time 4    Period Weeks    Status Achieved      PT LONG TERM GOAL #2   Title Pt will demo improved posture with more anterior tilt of pelvis, increased lumbar lordosis, and no cues for proper sitting/ standing posture  in order to minimize CLBP and return to more functional activities    Time 6    Period Weeks    Status Achieved      PT LONG TERM GOAL #3   Title Pt will demo no spinal deviation and more levelled pelvic girdle across 2 visits in order to advance towards deep core/ thoracolumbar strengthening exercises    Time 2    Period Weeks    Status Achieved      PT LONG TERM GOAL #4   Title Pt will demo decreased R pelvic floor mm tightness across 2 visits in order to  minimize pelvic pain and straining with bowel movements and optimize IAP system properly    Time 8    Period Weeks    Status Achieved      PT LONG TERM GOAL #5   Title Pt's FOTO score for Pain to decrease from 29pts to < 24pts and Urinary from 38 pts to < 30 pts and Bowel from 17 pts to < 12 pts and  Bowel Leakage improve 59 pts to > 64 pts    Baseline 11/8:Urinary  38 pts to 33 pts, PFDI Bowel 17pts to 4pts, bowel Leakage 59pts to 72 pts   07/21/20:    Time 10    Period Weeks    Status Partially Met      PT LONG TERM GOAL #6   Title Pt will report no urge to urinate when sitting or bending forward to put on his back pack  in order to attend meetings or leave the house    Time 8    Period Weeks    Status On-going      PT LONG TERM GOAL #7   Title 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    Time 10    Period Weeks    Status New    Target Date 08/18/20                 Plan - 08/11/20 1551    Clinical Impression Statement Pt arrived with brighter affect and explained he has felt better since last week as he recognized he is not as focused on when and whether he will get better. Pt reported:  Pt  started a journal documenting positive days as recommended.  Last week pt worked 9-3pm and then experienced less pain, less frequency, less urinating.  Also noticed no urge to urinate when leaning over the sink when brushing teeth. Pt 's noticed urge to uriante in teh morning was not as high.  Pt started acupucture yesterday. Pt noticed after the Tx, the pelvic  pain was lower and throughout the day he was more relaxed and not hurting as much.  Pt stated he is determined to make time for his appointments and self-care.   Today initiated a short yoga sequence in standing postures and provided cues for alignment and focus. Pt reported he felt relaxed afterwards and is motivated to practice at home. Recorded sequence for pt to watch for better good carry.   Biopsychosocial approaches are effect and pt is willing to try acupuncture as recommended by his PCP.  Provided encouragement and facilitated scheduling with  Healing Touch services offered through [facility]. Pt continues to benefit from skilled PT.    Examination-Activity Limitations Toileting;Sit    Stability/Clinical Decision Making Evolving/Moderate complexity    Rehab Potential Good    PT Frequency 1x / week    PT Duration Other (comment)   10   PT Treatment/Interventions Neuromuscular re-education;Patient/family education;Therapeutic exercise;Scar mobilization;Taping;Manual techniques;Moist Heat;Therapeutic activities;Gait training;Balance training    Consulted and Agree with Plan of Care Patient           Patient will benefit from skilled therapeutic intervention in order to improve the following deficits and impairments:  Abnormal gait,Decreased coordination,Decreased range of motion,Difficulty walking,Decreased endurance,Decreased safety awareness,Increased muscle spasms,Decreased balance,Decreased strength,Decreased mobility,Postural dysfunction,Improper body mechanics,Decreased scar mobility,Decreased activity tolerance,Increased fascial restricitons,Hypomobility  Visit Diagnosis: Sacrococcygeal disorders, not elsewhere classified  Pelvic pain  Chronic bilateral low back pain with left-sided sciatica  Urinary frequency  Abnormal posture  Other idiopathic scoliosis, thoracolumbar region     Problem List Patient Active Problem List   Diagnosis Date Noted  . Urinary frequency  12/23/2019  . Pelvic pain 12/23/2019  . Low back pain 12/22/2019    Jerl Mina ,PT, DPT, E-RYT  08/11/2020, 3:56 PM  [place] MAIN [facility] SERVICES [address] [place], [place], [address] Phone: [phone]   Fax:  [phone]  Name: Alec Reynolds MRN: [id number] Date of Birth: [birth year]

## 2020-08-11 NOTE — Patient Instructions (Addendum)
Mountain pose  Chair pose with arms back, fingers interlaced   Warrior I : Feet are hip width apart, one behind like you are on ski tracks, front knee bent over ankle but not more forward then the ankle.  Make sure 50% weight is in the front foot/leg , 50% weight is the back foot/ leg   Arms up like "V" , drop shoulders down  Palms together at the hears  3 breaths here.     Extended side angle :  Feet are hip width apart, L foot one behind like you are on ski tracks,  R knee bent over ankle but not more forward then the ankle.  Make sure 50% weight is in the front foot/leg , 50% weight is the back foot/ leg    Rest R forearm lightly on top of thigh,  L hand on L hip.  Inhale lengthen spine,   Exhale turn navel to the L then the ribcage turns, look at the other wall.  Keep maintaining  50% weight is in the front foot/leg , 50% weight is the back foot/ leg  And make sure the front knee is still pointed in the toe line of the 2nd toe.   3 breaths here.    Warrior II:  Raise arm up in front on the same side as your front knee, back arm up behind. Arms are aligned with the length of the mat Inhale,  Exhale and relax shoulders and ribcage  Make sure 50% weight is in the front foot/leg , 50% weight is the back foot/ leg   3 breaths here.    Warrior III:    Both Web designer.  Unlock knee of standing leg Foot pointed perpendicular to ground og leg in the air, hips squared to floor, TO AVOID LOW BACK PAIN, LIFT LEG AT A  LESS ANGLE FROM THE FLOOR and KEEP GAZE DOWN TO NOT PUFF OUT THE CHEST    3 breaths here.       __  Always end of corpse pose. 5-10 min   LOG roll to side and then sit up

## 2020-08-16 ENCOUNTER — Encounter: Payer: BC Managed Care – PPO | Admitting: Physical Therapy

## 2020-08-18 ENCOUNTER — Other Ambulatory Visit: Payer: Self-pay

## 2020-08-18 ENCOUNTER — Ambulatory Visit: Payer: 59 | Admitting: Physical Therapy

## 2020-08-18 DIAGNOSIS — R293 Abnormal posture: Secondary | ICD-10-CM

## 2020-08-18 DIAGNOSIS — M4125 Other idiopathic scoliosis, thoracolumbar region: Secondary | ICD-10-CM

## 2020-08-18 DIAGNOSIS — R350 Frequency of micturition: Secondary | ICD-10-CM

## 2020-08-18 DIAGNOSIS — M533 Sacrococcygeal disorders, not elsewhere classified: Secondary | ICD-10-CM

## 2020-08-18 DIAGNOSIS — G8929 Other chronic pain: Secondary | ICD-10-CM

## 2020-08-18 DIAGNOSIS — R102 Pelvic and perineal pain: Secondary | ICD-10-CM

## 2020-08-18 NOTE — Therapy (Signed)
[place] MAIN [facility] SERVICES [address] [place], [place], [address] Phone: [phone]   Fax:  [phone]  Physical Therapy Treatment  Patient Details  Name: Alec Reynolds MRN: [id number] Date of Birth: 06/13/1971 Referring Provider (PT): Fosnight    Encounter Date: 08/18/2020   PT End of Session - 08/18/20 1325    Visit Number 26    Date for PT Re-Evaluation 09/29/20   PN 05/09/20, 07/21/20   PT Start Time 4166    PT Stop Time 1400    PT Time Calculation (min) 55 min    Activity Tolerance Patient tolerated treatment well;No increased pain    Behavior During Therapy [facility] for tasks assessed/performed           No past medical history on file.  Past Surgical History:  Procedure Laterality Date  . ELBOW SURGERY Left   . WRIST SURGERY      There were no vitals filed for this visit.   Subjective Assessment - 08/18/20 1308    Subjective Pt started acupuncture for the past 2 weeks and he has noticed it is better to sit down. Pt noticed tightness along glut and midthigh. Overall, he is more comfortable to sit down, include sitting in his living room chair.  Last Monday, he noticed less constant urge and less trips to the bathroom. Pt decreased water from 120 fl oz to 64 fl oz.  Pt is getting up 2 x night to pee with less intense urge feelings.   Pt  has noticed his urgency is less in the afternoon and evening across 3-4 days out 12 days.  Pt tried the yoga practice a couple of times. Pt felt it was a like an active form of meditation. Pt felt calmer after doing the practice.  Pt had an evaluation for sleep study and will take home a unit to test .  Pt realizes he is    Pertinent History R groin injury 2017R ,  anxiety, stress Fx in R shin, low back as kid. 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 Pt also enjoyed cycling and stopped when the pain started. Pt used to cycle  2-3 hours 200 miles / day. Pt did not have a stretch routine. Pt completed Pelvic PT via telehealth one year and tried stretching but it made it worse.    Patient Stated Goals sit down for 2 hours  without hurting, Not  have to feel like he has to pee all the time              [facility] PT Assessment - 08/18/20 1814      Observation/Other Assessments   Observations L big toe /2nd digit overlaps L foot      Strength   Overall Strength --   no UE support , soft mat, PF L 5-6 reps, R 15 reps                        [facility] Adult PT Treatment/Exercise - 08/18/20 1814      Therapeutic Activites    Other Therapeutic Activities biopsychosocial approaches with ative listening to his improvements and his new perspectives about his Sx, his update on acupuncture Tx      Neuro Re-ed    Neuro Re-ed Details  cued for less upper trap voeruse in yoga poses from last session  PT Long Term Goals - 08/18/20 1817      PT LONG TERM GOAL #1   Title Pt will demo decreased abdominal separation from 4 fingers width  to < 1 fingers width to improve IAP system for pelvic floor function    Time 4    Period Weeks    Status Achieved      PT LONG TERM GOAL #2   Title Pt will demo improved posture with more anterior tilt of pelvis, increased lumbar lordosis, and no cues for proper sitting/ standing posture  in order to minimize CLBP and return to more functional activities    Time 6    Period Weeks    Status Achieved      PT LONG TERM GOAL #3   Title Pt will demo no spinal deviation and more levelled pelvic girdle across 2 visits in order to advance towards deep core/ thoracolumbar strengthening exercises    Time 2    Period Weeks    Status Achieved      PT LONG TERM GOAL #4   Title Pt will demo decreased R pelvic floor mm tightness across 2 visits in order to minimize pelvic pain and straining with bowel movements and optimize IAP system properly    Time 8     Period Weeks    Status Achieved      PT LONG TERM GOAL #5   Title Pt's FOTO score for Pain to decrease from 29pts to < 24pts and Urinary from 38 pts to < 30 pts and Bowel from 17 pts to < 12 pts and  Bowel Leakage improve 59 pts to > 64 pts    Baseline 11/8:Urinary  38 pts to 33 pts, PFDI Bowel 17pts to 4pts, bowel Leakage 59pts to 72 pts   07/21/20:    Time 10    Period Weeks    Status Partially Met      PT LONG TERM GOAL #6   Title Pt will report no urge to urinate when sitting or bending forward to put on his back pack  in order to attend meetings or leave the house    Time 8    Period Weeks    Status On-going      PT LONG TERM GOAL #7   Title 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    Time 10    Period Weeks    Status New                 Plan - 08/18/20 1326    Clinical Impression Statement Pt has received acupuncture for the past 2 weeks and pt has found it to be able to sit , including lhis living room chair. Pt  has noticed his urgency is less in the afternoon and evening across 3-4 days out 12 days. Pt will be undergoing to  a sleep study test .   Reviewed yoga poses from last session and provided cues for less overuse of upper trap with explanation of upper trap overuse and sympathetic nervous system. Progressed pt to heel raises without UE / less UE support to improve arches. Cued for modifications to Warrior III  Pose to challenge ankle stability and lift arches due to medial collapse of arches on R > L. Plan to address L overalapping big  toes and digit II at next session and progress with PT HEP for lower kinetic chain deficits.   Pt continues to be compliant with keeping a journal and logging his improvements.   Pt continues to benefit from skilled PT      Examination-Activity Limitations Toileting;Sit    Stability/Clinical Decision Making Evolving/Moderate complexity    Rehab Potential Good    PT Frequency 1x / week    PT Duration Other (comment)   10   PT Treatment/Interventions Neuromuscular re-education;Patient/family education;Therapeutic exercise;Scar mobilization;Taping;Manual techniques;Moist Heat;Therapeutic activities;Gait training;Balance training    Consulted and Agree with Plan of Care Patient           Patient will benefit from skilled therapeutic intervention in order to improve the following deficits and impairments:  Abnormal gait,Decreased coordination,Decreased range of motion,Difficulty walking,Decreased endurance,Decreased safety awareness,Increased muscle spasms,Decreased balance,Decreased strength,Decreased mobility,Postural dysfunction,Improper body mechanics,Decreased scar mobility,Decreased activity tolerance,Increased fascial restricitons,Hypomobility  Visit Diagnosis: Pelvic pain  Sacrococcygeal disorders, not elsewhere classified  Urinary frequency  Chronic bilateral low back pain with left-sided sciatica  Abnormal posture  Other idiopathic scoliosis, thoracolumbar region     Problem List Patient Active Problem List   Diagnosis Date Noted  . Urinary frequency 12/23/2019  . Pelvic pain 12/23/2019  . Low back pain 12/22/2019    Jerl Mina ,PT, DPT, E-RYT  08/18/2020, 6:17 PM  [place] MAIN [facility] SERVICES [address] [place], [place], [address] Phone: [phone]   Fax:  [phone]  Name: Noa Constante MRN: [id number] Date of Birth: 01/30/1971

## 2020-08-18 NOTE — Patient Instructions (Signed)
Heel raises without hand on counter or use finger tip   Refined awareness in past yoga poses - relax shoulders

## 2020-08-23 ENCOUNTER — Encounter: Payer: BC Managed Care – PPO | Admitting: Physical Therapy

## 2020-08-25 ENCOUNTER — Other Ambulatory Visit: Payer: Self-pay

## 2020-08-25 ENCOUNTER — Ambulatory Visit: Payer: 59 | Admitting: Physical Therapy

## 2020-08-25 DIAGNOSIS — R350 Frequency of micturition: Secondary | ICD-10-CM

## 2020-08-25 DIAGNOSIS — M533 Sacrococcygeal disorders, not elsewhere classified: Secondary | ICD-10-CM

## 2020-08-25 DIAGNOSIS — R293 Abnormal posture: Secondary | ICD-10-CM

## 2020-08-25 DIAGNOSIS — M4125 Other idiopathic scoliosis, thoracolumbar region: Secondary | ICD-10-CM

## 2020-08-25 DIAGNOSIS — G8929 Other chronic pain: Secondary | ICD-10-CM

## 2020-08-25 DIAGNOSIS — R102 Pelvic and perineal pain: Secondary | ICD-10-CM

## 2020-08-25 NOTE — Therapy (Signed)
[place] MAIN [facility] SERVICES [address] [place], [place], [address] Phone: [phone]   Fax:  [phone]  Physical Therapy Treatment  Patient Details  Name: Alec Reynolds MRN: [id number] Date of Birth: 1970/12/26 Referring Provider (PT): Fosnight    Encounter Date: 08/25/2020   PT End of Session - 08/25/20 1404    Visit Number 27    Date for PT Re-Evaluation 09/29/20   PN 05/09/20, 07/21/20   PT Start Time 1303    PT Stop Time 1405    PT Time Calculation (min) 62 min    Activity Tolerance Patient tolerated treatment well;No increased pain    Behavior During Therapy [facility] for tasks assessed/performed           No past medical history on file.  Past Surgical History:  Procedure Laterality Date  . ELBOW SURGERY Left   . WRIST SURGERY      There were no vitals filed for this visit.   Subjective Assessment - 08/25/20 1307    Subjective Pt reported the urge and pain with leaning forward is getting better . Pt noticed when he sat down after acupuncture appt yesterday, he felt more comfortable. As the day goes on, he notices increased pain and swollen penis after sitting on a couch in slumped position. Pt feels motivated to take care of himself to care for his wife. Pt has not had any nocturnal erections lately. Pt notices his pelvic floor are tight upon waking. Two days ago, pt slept for 7 hours straight. On those mornings, pt noticed his pelvic floor mm are not as tight. Pt is still waiting to his sleep study equipment.    Pertinent History R groin injury 2017R ,  anxiety, stress Fx in R shin, low back as kid. 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 Pt also enjoyed cycling and stopped when the pain started. Pt used to cycle 2-3 hours 200 miles / day. Pt did not have a stretch routine. Pt completed Pelvic PT via telehealth one year and tried stretching but it made it  worse.    Patient Stated Goals sit down for 2 hours  without hurting, Not  have to feel like he has to pee all the time              [facility] PT Assessment - 08/25/20 1315      Observation/Other Assessments   Observations less overlapping of L big toe , digit II      Strength   Overall Strength --   no UE support, R 9reps, L 4 reps     Palpation   Palpation comment hypomobile lateral midfoot joints, tightness at digiti minimi brevis, oppenes brevis, interrous between II-III rays, oblique mm of adductor hallucis, DAB/PAD at distal metatrasals L                         [facility] Adult PT Treatment/Exercise - 08/25/20 1402      Neuro Re-ed    Neuro Re-ed Details  cued new weight bearing on L transerve arch exericses      Manual Therapy   Manual therapy comments STM/MWM , AP mob / PA mob to address hypomobile lateral midfoot joints, tightness at digiti minimi brevis, oppenes brevis, interrous between II-III rays, oblique mm of adductor hallucis, DAB/PAD at distal metatrasals L    Kinesiotex --  lift arch                      PT Long Term Goals - 08/18/20 1817      PT LONG TERM GOAL #1   Title Pt will demo decreased abdominal separation from 4 fingers width  to < 1 fingers width to improve IAP system for pelvic floor function    Time 4    Period Weeks    Status Achieved      PT LONG TERM GOAL #2   Title Pt will demo improved posture with more anterior tilt of pelvis, increased lumbar lordosis, and no cues for proper sitting/ standing posture  in order to minimize CLBP and return to more functional activities    Time 6    Period Weeks    Status Achieved      PT LONG TERM GOAL #3   Title Pt will demo no spinal deviation and more levelled pelvic girdle across 2 visits in order to advance towards deep core/ thoracolumbar strengthening exercises    Time 2    Period Weeks    Status Achieved      PT LONG TERM GOAL #4   Title Pt will demo decreased R  pelvic floor mm tightness across 2 visits in order to minimize pelvic pain and straining with bowel movements and optimize IAP system properly    Time 8    Period Weeks    Status Achieved      PT LONG TERM GOAL #5   Title Pt's FOTO score for Pain to decrease from 29pts to < 24pts and Urinary from 38 pts to < 30 pts and Bowel from 17 pts to < 12 pts and  Bowel Leakage improve 59 pts to > 64 pts    Baseline 11/8:Urinary  38 pts to 33 pts, PFDI Bowel 17pts to 4pts, bowel Leakage 59pts to 72 pts   07/21/20:    Time 10    Period Weeks    Status Partially Met      PT LONG TERM GOAL #6   Title Pt will report no urge to urinate when sitting or bending forward to put on his back pack  in order to attend meetings or leave the house    Time 8    Period Weeks    Status On-going      PT LONG TERM GOAL #7   Title 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    Time 10    Period Weeks    Status New                 Plan - 08/25/20 1404    Clinical Impression Statement Pt required further manual Tx to promote DF/EV at L ankle and to lift arch. Following Tx, pt demo'd improved balance with heel raises. Pt's big  toe is no longer overlapping with digit II after the past two Tx which focused on optimize toe abduction and lifting foot arch. Anticipate this L sided deficit at foot can help with L sided pelvic floor issues. Pt reported L big toe distal joint was broken in the past which explains why it is deviated. Pt continues to have a more positive demeanor with his Sx and is  receiving acupuncture. Pt 's milestone improvement last week was being able to sleep for one night uninterrupted for 7 hours without nocturia. Pt is int he process of receiving equipment to complete a sleep study test to r/o or rule in OSA. Pt continues to benefit from skilled PT    Examination-Activity Limitations Toileting;Sit    Stability/Clinical Decision Making Evolving/Moderate complexity    Rehab Potential Good    PT Frequency 1x / week    PT Duration Other (comment)   10   PT Treatment/Interventions Neuromuscular re-education;Patient/family education;Therapeutic exercise;Scar mobilization;Taping;Manual techniques;Moist Heat;Therapeutic activities;Gait training;Balance training    Consulted and Agree with Plan of Care Patient           Patient will benefit from skilled therapeutic intervention in order to improve the following deficits and impairments:  Abnormal gait,Decreased coordination,Decreased range of motion,Difficulty walking,Decreased endurance,Decreased safety awareness,Increased muscle spasms,Decreased balance,Decreased strength,Decreased mobility,Postural dysfunction,Improper body mechanics,Decreased scar mobility,Decreased activity tolerance,Increased fascial restricitons,Hypomobility  Visit Diagnosis: Pelvic pain  Sacrococcygeal disorders, not elsewhere classified  Urinary frequency  Chronic bilateral low back pain with left-sided sciatica  Abnormal posture  Other idiopathic scoliosis, thoracolumbar region     Problem List Patient Active Problem List   Diagnosis Date Noted  . Urinary frequency 12/23/2019  . Pelvic pain 12/23/2019  . Low back pain 12/22/2019    Jerl Mina ,PT, DPT, E-RYT  08/25/2020, 2:27 PM  [place] MAIN [facility] SERVICES [address] [place], [place], [address] Phone: [phone]   Fax:  [phone]  Name: Joshua Soulier MRN: [id number] Date of Birth: 1970-09-22

## 2020-08-30 ENCOUNTER — Encounter: Payer: BC Managed Care – PPO | Admitting: Physical Therapy

## 2020-09-01 ENCOUNTER — Other Ambulatory Visit: Payer: Self-pay

## 2020-09-01 ENCOUNTER — Ambulatory Visit: Payer: 59 | Attending: Physician Assistant | Admitting: Physical Therapy

## 2020-09-01 DIAGNOSIS — R350 Frequency of micturition: Secondary | ICD-10-CM | POA: Diagnosis present

## 2020-09-01 DIAGNOSIS — M533 Sacrococcygeal disorders, not elsewhere classified: Secondary | ICD-10-CM | POA: Diagnosis present

## 2020-09-01 DIAGNOSIS — M5442 Lumbago with sciatica, left side: Secondary | ICD-10-CM | POA: Diagnosis present

## 2020-09-01 DIAGNOSIS — R293 Abnormal posture: Secondary | ICD-10-CM | POA: Diagnosis present

## 2020-09-01 DIAGNOSIS — M4125 Other idiopathic scoliosis, thoracolumbar region: Secondary | ICD-10-CM | POA: Diagnosis present

## 2020-09-01 DIAGNOSIS — R2689 Other abnormalities of gait and mobility: Secondary | ICD-10-CM | POA: Insufficient documentation

## 2020-09-01 DIAGNOSIS — G8929 Other chronic pain: Secondary | ICD-10-CM | POA: Insufficient documentation

## 2020-09-01 DIAGNOSIS — R102 Pelvic and perineal pain: Secondary | ICD-10-CM | POA: Insufficient documentation

## 2020-09-01 NOTE — Therapy (Signed)
[place] MAIN [facility] SERVICES [address] [place], [place], [address] Phone: [phone]   Fax:  [phone]  Physical Therapy Treatment  Patient Details  Name: Alec Reynolds MRN: [id number] Date of Birth: 04/06/1971 Referring Provider (PT): Fosnight    Encounter Date: 09/01/2020   PT End of Session - 09/01/20 1443    Visit Number 28    Date for PT Re-Evaluation 09/29/20   PN 05/09/20, 07/21/20   PT Start Time 1300    PT Stop Time 1400    PT Time Calculation (min) 60 min    Activity Tolerance Patient tolerated treatment well;No increased pain    Behavior During Therapy [facility] for tasks assessed/performed           No past medical history on file.  Past Surgical History:  Procedure Laterality Date  . ELBOW SURGERY Left   . WRIST SURGERY      There were no vitals filed for this visit.   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 Pt notices his L glut got cramping after he exerts himself with running on a tennis court.   Pt notices his L glut got cramping after he exerts himself with running on a tennis court.    Pt also had 2  nocturnal erections the past week. Pt noticed his penis is not as painful with the erections.  Pt had to pee in the middle of the night and felt the urge to go at the L base of penis. Pt still had the erection when he tried to pee and it was painful because he could not get his penis down. Pt has not had nocturnal erections for 15 years.  That morning , it was tight with bowel movements.  Pt notices he has Stool Type 4 consistency of stool and has one BM a day. Pt is not constipated anymore.   Last two days, pt is in pain at the same areas. They were getting better before the erections.   Pt started [employer] and using the monitor to help with his meditation. Pt has noticed he is not as reactive to stress.  The  tape at L foot really helped his foot.    Pertinent History R groin injury 2017R ,  anxiety, stress Fx in R shin, low back as kid. 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 Pt also enjoyed cycling and stopped when the pain started. Pt used to cycle 2-3 hours 200 miles / day. Pt did not have a stretch routine. Pt completed Pelvic PT via telehealth one year and tried stretching but it made it worse.    Patient Stated Goals sit down for 2 hours  without hurting, Not  have to feel like he has to pee all the time              [facility] PT Assessment - 09/01/20 1333      Observation/Other Assessments   Observations less overlapping of L big toe , digit II, toe abuction improved      Strength   Overall Strength Other (comment)   PF no UE supprt: R  UE 22 reps , L  14 reps     Palpation   Palpation comment hypomobile lateral midfoot joints, tightness at digiti minimi brevis, oppenes  brevis, interrous and joints between II-III, III-IV,                         [facility] Adult PT Treatment/Exercise - 09/01/20 1443      Therapeutic Activites    Other Therapeutic Activities active listening, explaining about the nervous system with pelvic function, reassessed PF      Manual Therapy   Manual therapy comments STM/MWM , AP mob / PA mob to address hypomobile lateral midfoot joints, tightness at digiti minimi brevis, oppenes brevis, interrous between II-III rays, oblique mm of adductor hallucis, DAB/PAD at distal metatrasals L    Kinesiotex --   lift arch                      PT Long Term Goals - 08/18/20 1817      PT LONG TERM GOAL #1   Title Pt will demo decreased abdominal separation from 4 fingers width  to < 1 fingers width to improve IAP system for pelvic floor function    Time 4    Period Weeks    Status Achieved      PT LONG TERM GOAL #2   Title Pt will demo improved posture with more anterior tilt of  pelvis, increased lumbar lordosis, and no cues for proper sitting/ standing posture  in order to minimize CLBP and return to more functional activities    Time 6    Period Weeks    Status Achieved      PT LONG TERM GOAL #3   Title Pt will demo no spinal deviation and more levelled pelvic girdle across 2 visits in order to advance towards deep core/ thoracolumbar strengthening exercises    Time 2    Period Weeks    Status Achieved      PT LONG TERM GOAL #4   Title Pt will demo decreased R pelvic floor mm tightness across 2 visits in order to minimize pelvic pain and straining with bowel movements and optimize IAP system properly    Time 8    Period Weeks    Status Achieved      PT LONG TERM GOAL #5   Title Pt's FOTO score for Pain to decrease from 29pts to < 24pts and Urinary from 38 pts to < 30 pts and Bowel from 17 pts to < 12 pts and  Bowel Leakage improve 59 pts to > 64 pts    Baseline 11/8:Urinary  38 pts to 33 pts, PFDI Bowel 17pts to 4pts, bowel Leakage 59pts to 72 pts   07/21/20:    Time 10    Period Weeks    Status Partially Met      PT LONG TERM GOAL #6   Title Pt will report no urge to urinate when sitting or bending forward to put on his back pack  in order to attend meetings or leave the house    Time 8    Period Weeks    Status On-going      PT LONG TERM GOAL #7   Title 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    Time 10    Period Weeks    Status New                 Plan - 09/01/20 1336    Clinical Impression Statement Pt showed  improved mobilization of L midfoot joints and lifted arch which showed good carry over from last session. Pt improved on PF strength without UE support ( 4reps to 14 reps on R) and (9 reps to 22 reps on L) across 1 week.  Pt feels his BMs and urge is getting better. Pt noticed he has been getting nocturnal erections again which has not occurred for 15 years. He has started to experience a few months ago after Tx to pelvic floor. This time with the nocturnal erections, pt noticed increased pain with urination as pt typically has a strong urge to pee.   Pt is practicing more relaxation techniques he has found through his own research [facility]). Pt continues to receive acupuncture.  Pt has been compliant with documenting his improvements.   Plan to continue improving his L foot arch to minimize medial arch collapse and anticipate this can help minimize L git / pelvic pain. Pt continues to benefit from skilled PT      Examination-Activity Limitations Toileting;Sit    Stability/Clinical Decision Making Evolving/Moderate complexity    Rehab Potential Good    PT Frequency 1x / week    PT Duration Other (comment)   10   PT Treatment/Interventions Neuromuscular re-education;Patient/family education;Therapeutic exercise;Scar mobilization;Taping;Manual techniques;Moist Heat;Therapeutic activities;Gait training;Balance training    Consulted and Agree with Plan of Care Patient           Patient will benefit from skilled therapeutic intervention in order to improve the following deficits and impairments:  Abnormal gait,Decreased coordination,Decreased range of motion,Difficulty walking,Decreased endurance,Decreased safety awareness,Increased muscle spasms,Decreased balance,Decreased strength,Decreased mobility,Postural dysfunction,Improper body mechanics,Decreased scar mobility,Decreased activity tolerance,Increased fascial restricitons,Hypomobility  Visit Diagnosis: Pelvic pain  Other  abnormalities of gait and mobility  Sacrococcygeal disorders, not elsewhere classified  Urinary frequency  Chronic bilateral low back pain with left-sided sciatica  Abnormal posture  Other idiopathic scoliosis, thoracolumbar region     Problem List Patient Active Problem List   Diagnosis Date Noted  . Urinary frequency 12/23/2019  . Pelvic pain 12/23/2019  . Low back pain 12/22/2019    Jerl Mina 09/01/2020, 2:46 PM  [place] MAIN [facility] SERVICES [address] [place], [place], [address] Phone: [phone]   Fax:  [phone]  Name: Nishanth Mccaughan MRN: [id number] Date of Birth: [birth year]

## 2020-09-06 ENCOUNTER — Encounter: Payer: BC Managed Care – PPO | Admitting: Physical Therapy

## 2020-09-08 ENCOUNTER — Other Ambulatory Visit: Payer: Self-pay

## 2020-09-08 ENCOUNTER — Ambulatory Visit: Payer: 59 | Admitting: Physical Therapy

## 2020-09-08 DIAGNOSIS — R293 Abnormal posture: Secondary | ICD-10-CM

## 2020-09-08 DIAGNOSIS — G8929 Other chronic pain: Secondary | ICD-10-CM

## 2020-09-08 DIAGNOSIS — M533 Sacrococcygeal disorders, not elsewhere classified: Secondary | ICD-10-CM

## 2020-09-08 DIAGNOSIS — R102 Pelvic and perineal pain: Secondary | ICD-10-CM | POA: Diagnosis not present

## 2020-09-08 DIAGNOSIS — R350 Frequency of micturition: Secondary | ICD-10-CM

## 2020-09-08 DIAGNOSIS — R2689 Other abnormalities of gait and mobility: Secondary | ICD-10-CM

## 2020-09-08 NOTE — Patient Instructions (Signed)
childs pose restorative with bolster blankets under torso  Blocks under forearms  Rolled blanket under thighs

## 2020-09-08 NOTE — Therapy (Signed)
[place] MAIN [facility] SERVICES [address] [place], [place], [address] Phone: [phone]   Fax:  [phone]  Physical Therapy Treatment  Patient Details  Name: Han Cuttriss-Curtis MRN: [id number] Date of Birth: [birth year] Referring Provider (PT): Fosnight    Encounter Date: 09/08/2020   PT End of Session - 09/08/20 1401    Visit Number 29    Date for PT Re-Evaluation 09/29/20   PN 05/09/20, 07/21/20   PT Start Time 1310    PT Stop Time 1400    PT Time Calculation (min) 50 min    Activity Tolerance Patient tolerated treatment well;No increased pain    Behavior During Therapy [facility] for tasks assessed/performed           No past medical history on file.  Past Surgical History:  Procedure Laterality Date  . ELBOW SURGERY Left   . WRIST SURGERY      There were no vitals filed for this visit.   Subjective Assessment - 09/08/20 1314    Subjective Pt 's pain by L SIJ and base of penis has ramped up this past week from 3-4/10 to 7/10. 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 Buttock hurts when lying on his back. Pt finds it easier to sleep. It no longer hurts to sleep on R side, knees together.  He notices the penile pain occurred when stretch calf with yoga block under foot. Pt is able to do more heel raises without touching wall.    Pertinent History R groin injury 2017R ,  anxiety, stress Fx in R shin, low back as kid. 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 Pt also enjoyed cycling and stopped when the pain started. Pt used to cycle 2-3 hours 200 miles / day. Pt did not have a stretch routine. Pt completed Pelvic PT via telehealth one year and tried stretching but it made it worse.    Patient Stated Goals sit down for 2 hours  without hurting, Not  have to feel like he has to pee all the time              [facility] PT Assessment - 09/08/20 1656       Observation/Other Assessments   Observations L big toe no longer overlapping .  Calf stretch pt does at home on yoga block did not lift plantar arch                      Pelvic Floor Special Questions - 09/08/20 1655    External Perineal Exam tightness at anterior and posterior tightness L > R             [facility] Adult PT Treatment/Exercise - 09/08/20 1648      Therapeutic Activites    Other Therapeutic Activities explained principles of restorative yoga in child pose to help with relaxing pelvic floor      Neuro Re-ed    Neuro Re-ed Details  cued for different position of yoga block for calf stretch to lift plantar arch , cued for restorative yoga set up in childs pose over bolster      Moist Heat Therapy   Number Minutes Moist Heat 5 Minutes    Moist Heat Location --   perineum in childs pose with guided resistance diaphragm breathing and relaxation of pelvic floor  Manual Therapy   Manual therapy comments external technique, anterior and posterior mm releases with pelvic tilt and MWM    Kinesiotex --   L big toe                      PT Long Term Goals - 09/01/20 1447      PT LONG TERM GOAL #1   Title Pt will demo decreased abdominal separation from 4 fingers width  to < 1 fingers width to improve IAP system for pelvic floor function    Time 4    Period Weeks    Status Achieved      PT LONG TERM GOAL #2   Title Pt will demo improved posture with more anterior tilt of pelvis, increased lumbar lordosis, and no cues for proper sitting/ standing posture  in order to minimize CLBP and return to more functional activities    Time 6    Period Weeks    Status Achieved      PT LONG TERM GOAL #3   Title Pt will demo no spinal deviation and more levelled pelvic girdle across 2 visits in order to advance towards deep core/ thoracolumbar strengthening exercises    Time 2    Period Weeks    Status Achieved      PT LONG TERM GOAL #4   Title Pt will demo  decreased R pelvic floor mm tightness across 2 visits in order to minimize pelvic pain and straining with bowel movements and optimize IAP system properly    Time 8    Period Weeks    Status Achieved      PT LONG TERM GOAL #5   Title Pt's FOTO score for Pain to decrease from 29pts to < 24pts and Urinary from 38 pts to < 30 pts and Bowel from 17 pts to < 12 pts and  Bowel Leakage improve 59 pts to > 64 pts    Baseline 11/8:Urinary  38 pts to 33 pts, PFDI Bowel 17pts to 4pts, bowel Leakage 59pts to 72 pts   07/21/20:    Time 10    Period Weeks    Status Partially Met      PT LONG TERM GOAL #6   Title Pt will report no urge to urinate when sitting or bending forward to put on his back pack  in order to attend meetings or leave the house    Time 8    Period Weeks    Status On-going      PT LONG TERM GOAL #7   Title 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    Time 10    Period Weeks    Status New                 Plan - 09/08/20 1647    Clinical Impression  Statement Pt demo'd increased pelvic floor tightness which decreased  Post Tx. Initiated restorative yoga education and utilized childs pose over bolster to engage in diaphragmatic breathing against resistance to promote pelvic floor lengthening. Pt 's feet arches are improving. Plan to add global strengthening to minimize overactivity of pelvic floor and optimize endurance to help his job requirement to stay on his feet for hours on asphalt.   Pt had calmer demeanor towards his increased pelvic pain.  Today's reassessment of tighter pelvic floor mm confirmed this relapse of pain. Discussed getting new shoes.   Pt continues to benefit from skilled PT    Examination-Activity Limitations Toileting;Sit    Stability/Clinical Decision Making Evolving/Moderate complexity    Rehab Potential Good    PT Frequency 1x / week    PT Duration Other (comment)   10   PT Treatment/Interventions Neuromuscular re-education;Patient/family education;Therapeutic exercise;Scar mobilization;Taping;Manual techniques;Moist Heat;Therapeutic activities;Gait training;Balance training    Consulted and Agree with Plan of Care Patient           Patient will benefit from skilled therapeutic intervention in order to improve the following deficits and impairments:  Abnormal gait,Decreased coordination,Decreased range of motion,Difficulty walking,Decreased endurance,Decreased safety awareness,Increased muscle spasms,Decreased balance,Decreased strength,Decreased mobility,Postural dysfunction,Improper body mechanics,Decreased scar mobility,Decreased activity tolerance,Increased fascial restricitons,Hypomobility  Visit Diagnosis: Other abnormalities of gait and mobility  Pelvic pain  Sacrococcygeal disorders, not elsewhere classified  Urinary frequency  Chronic bilateral low back pain with left-sided sciatica  Abnormal posture     Problem List Patient Active Problem List   Diagnosis Date Noted  . Urinary frequency 12/23/2019  . Pelvic pain 12/23/2019  . Low back pain 12/22/2019    Jerl Mina ,PT, DPT, E-RYT  09/08/2020, 5:08 PM  [place] MAIN [facility] SERVICES [address] [place], [place], [address] Phone: [phone]   Fax:  [phone]  Name: Arnav Cregg MRN: [id number] Date of Birth: 11/25/1970

## 2020-09-13 ENCOUNTER — Encounter: Payer: BC Managed Care – PPO | Admitting: Physical Therapy

## 2020-09-15 ENCOUNTER — Ambulatory Visit: Payer: 59 | Admitting: Physical Therapy

## 2020-09-15 ENCOUNTER — Other Ambulatory Visit: Payer: Self-pay

## 2020-09-15 DIAGNOSIS — R2689 Other abnormalities of gait and mobility: Secondary | ICD-10-CM

## 2020-09-15 DIAGNOSIS — R350 Frequency of micturition: Secondary | ICD-10-CM

## 2020-09-15 DIAGNOSIS — R293 Abnormal posture: Secondary | ICD-10-CM

## 2020-09-15 DIAGNOSIS — M533 Sacrococcygeal disorders, not elsewhere classified: Secondary | ICD-10-CM

## 2020-09-15 DIAGNOSIS — G8929 Other chronic pain: Secondary | ICD-10-CM

## 2020-09-15 DIAGNOSIS — R102 Pelvic and perineal pain: Secondary | ICD-10-CM

## 2020-09-16 NOTE — Therapy (Addendum)
[place] MAIN [facility] SERVICES [address] [place], [place], [address] Phone: [phone]   Fax:  [phone]  Physical Therapy Treatment / Progress Note across 30 visits   Patient Details  Name: Alec Reynolds MRN: [id number] Date of Birth: [birth year] Referring Provider (PT): Fosnight    Encounter Date: 09/15/2020   PT End of Session - 09/22/20 1653    Visit Number 31    Date for PT Re-Evaluation 09/29/20   PN 05/09/20, 07/21/20   PT Start Time 1300    PT Stop Time 1400    PT Time Calculation (min) 60 min    Activity Tolerance Patient tolerated treatment well;No increased pain    Behavior During Therapy [facility] for tasks assessed/performed           No past medical history on file.  Past Surgical History:  Procedure Laterality Date  . ELBOW SURGERY Left   . WRIST SURGERY      There were no vitals filed for this visit.   Subjective Assessment - 09/15/20 1648    Subjective Pt felt more relaxed and less pain but urinary urgency and frequency was stirred up by  last treatment. 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 Pt woke up on the couch with a strong noturnal erection. Pt has done that before in his lide when he was really tired. 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 Acupuncture is going well and he feels better when he leaves. The acupucturist gave him new exercises and noticed after doing them, he noticed more pain at base of penis after nocturnal erections after he received the acupunctreure and performing the exercises that include : lifting head while lying on back, belly breaths, leg lifts. Pt has continued to do these for the past 2 weeks.    Pertinent History R groin injury 2017R ,  anxiety, stress Fx in R shin, low back as kid. 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 Pt also enjoyed cycling and stopped when the pain started. Pt used to cycle 2-3 hours 200 miles / day. Pt did not have a stretch routine. Pt completed Pelvic PT via telehealth one year and tried stretching but it made it worse.    Patient Stated Goals sit down for 2 hours  without hurting, Not  have to feel like he has to pee all the time                          Pelvic Floor Special Questions - 09/15/20 1651    External Perineal Exam less tightness at anterior and posterior tightness L > R             [facility] Adult PT Treatment/Exercise - 09/15/20 1648      Therapeutic Activites    Other Therapeutic Activities explained how the exercises prescribed by acupuncturist can cause more tightness to pelvic floor, explained to discontinue them , provided active listening, troubleshooting the cause for increased mm tightness from last week                       PT  Long Term Goals - 09/22/20 1447      PT LONG TERM GOAL #1   Title Pt will demo decreased abdominal separation from 4 fingers width  to < 1 fingers width to improve IAP system for pelvic floor function    Time 4    Period Weeks    Status Achieved      PT LONG TERM GOAL #2   Title Pt will demo improved posture with more anterior tilt of pelvis, increased lumbar lordosis, and no cues for proper sitting/ standing posture  in order to minimize CLBP and return to more functional activities    Time 6    Period Weeks    Status Achieved      PT LONG TERM GOAL #3   Title Pt will demo no spinal deviation and more levelled pelvic girdle across 2 visits in order to advance towards deep core/ thoracolumbar strengthening exercises    Time 2    Period Weeks    Status Achieved      PT LONG TERM GOAL #4   Title Pt will demo decreased R pelvic floor mm tightness across 2 visits in order to minimize pelvic pain and straining  with bowel movements and optimize IAP system properly    Time 8    Period Weeks    Status Achieved      PT LONG TERM GOAL #5   Title Pt's FOTO score for Pain to decrease from 29pts to < 24pts and Urinary from 38 pts to < 30 pts and Bowel from 17 pts to < 12 pts and  Bowel Leakage improve 59 pts to > 64 pts    Baseline 11/8:Urinary  38 pts to 33 pts, PFDI Bowel 17pts to 4pts, bowel Leakage 59pts to 72 pts   07/21/20:    Time 10    Period Weeks    Status Partially Met      PT LONG TERM GOAL #6   Title Pt will report no urge to urinate when sitting or bending forward to put on his back pack  in order to attend meetings or leave the house    Time 8    Period Weeks    Status On-going      PT LONG TERM GOAL #7   Title 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    Time 10    Period Weeks    Status New                 Plan - 09/22/20 1651    Clinical Impression  Pt has achieved 4/9 goals and progressing well towards remaining goals   Pt has made improvements and demo'd decreased pelvic floor mm tightness today. Pt has less urinary frequency and urgency but had increased pelvic pain the past weeks. Applied active listening and troubleshooted the cause for overactivity of  pelvic floor and increased Sx.  Advised pt to discontinue the exercsies that his acupunctureist had him do which involved straining pelvic floor area and likely associated with his relapse of pelvic floor mm tightness. Focused on more relaxation and biopsychosocial approaches. Plan to advance  lower kinetic chain strengthening to build endurance. Pt's L big toe showed less deviation and less overlap with digit II and B feet is improving with toe abduction and less collapsed arches.   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 Pt continues to benefit from skilled PT   Examination-Activity Limitations Toileting;Sit    Stability/Clinical Decision Making Evolving/Moderate complexity    Rehab Potential Good    PT Frequency 1x / week    PT Duration Other (comment)   10   PT Treatment/Interventions Neuromuscular re-education;Patient/family education;Therapeutic exercise;Scar mobilization;Taping;Manual techniques;Moist Heat;Therapeutic activities;Gait training;Balance training    Consulted and Agree with Plan of Care Patient           Patient will benefit from skilled therapeutic intervention in order to improve the following deficits and impairments:  Abnormal gait,Decreased coordination,Decreased range of motion,Difficulty walking,Decreased endurance,Decreased safety awareness,Increased muscle spasms,Decreased balance,Decreased strength,Decreased mobility,Postural dysfunction,Improper body mechanics,Decreased scar mobility,Decreased activity tolerance,Increased fascial restricitons,Hypomobility  Visit Diagnosis: Other abnormalities of gait and mobility  Pelvic pain  Sacrococcygeal disorders, not elsewhere classified  Urinary frequency  Chronic bilateral low back pain with left-sided sciatica  Abnormal posture     Problem List Patient Active Problem List   Diagnosis Date Noted  . Urinary frequency 12/23/2019  . Pelvic pain 12/23/2019  . Low back pain 12/22/2019    Jerl Mina ,PT, DPT, E-RYT  09/22/2020, 4:54 PM  [place] MAIN [facility] SERVICES [address] [place], [place], [address] Phone: [phone]   Fax:   [phone]  Name: Alec Reynolds MRN: [id number] Date of Birth: 07-10-1970

## 2020-09-27 ENCOUNTER — Other Ambulatory Visit: Payer: Self-pay

## 2020-09-27 ENCOUNTER — Ambulatory Visit: Payer: 59 | Admitting: Physical Therapy

## 2020-09-27 DIAGNOSIS — R350 Frequency of micturition: Secondary | ICD-10-CM

## 2020-09-27 DIAGNOSIS — R2689 Other abnormalities of gait and mobility: Secondary | ICD-10-CM

## 2020-09-27 DIAGNOSIS — M533 Sacrococcygeal disorders, not elsewhere classified: Secondary | ICD-10-CM

## 2020-09-27 DIAGNOSIS — R102 Pelvic and perineal pain unspecified side: Secondary | ICD-10-CM

## 2020-09-27 DIAGNOSIS — M4125 Other idiopathic scoliosis, thoracolumbar region: Secondary | ICD-10-CM

## 2020-09-27 DIAGNOSIS — G8929 Other chronic pain: Secondary | ICD-10-CM

## 2020-09-27 DIAGNOSIS — R293 Abnormal posture: Secondary | ICD-10-CM

## 2020-09-27 NOTE — Patient Instructions (Addendum)
On small trampoline Feet wide hip width apart Band over door,  face away from door  Marching with arms moving like walking  Chin tuck More pressure on outer edge of L foot  3 min  __  Standing balance on L foot , Gaze straight ahead  On pillow ( uneven ground training)  30 in sec   Flat on floor  30 in sec    __  Review Sleep Talk presentation

## 2020-09-28 NOTE — Therapy (Signed)
[facility] [address] [place], [place], [address] Phone: [phone]   Fax:  [phone]  Physical Therapy Treatment  Patient Details  Name: Alec Reynolds MRN: [id number] Date of Birth: 08/11/1970 Referring Provider (PT): Fosnight    Encounter Date: 09/27/2020   PT End of Session - 09/27/20 1616    Visit Number 32    Date for PT Re-Evaluation 09/29/20   PN 05/09/20, 07/21/20   PT Start Time 1611    PT Stop Time 1720    PT Time Calculation (min) 69 min    Activity Tolerance Patient tolerated treatment well;No increased pain    Behavior During Therapy WFL for tasks assessed/performed           No past medical history on file.  Past Surgical History:  Procedure Laterality Date  . ELBOW SURGERY Left   . WRIST SURGERY      There were no vitals filed for this visit.   Subjective Assessment - 09/27/20 1616    Subjective Pt reported his baseline pain before adding the execises prescribed by the acupuncturist was 3-4/10 and then after performing theses exerscies, pelvic pain worsened to 7/10. Since stopping these exercsies, pain has decreased to 5/10.  Urge to pee is better with standing verseus sitting down when urinating. Bendin forward forward causes urge to pee. Urgency relapse has not improved since stopping the exercises.  Pt reported urgency with pain inside low abdomen and pain at inside of penis with peeing. When he gets acupuncture , he feels less pain and more relaxed and it causes him to fall asleep and the relief lasts for 5-6 hours. Pt saw PCP about pain near his ear and he was Dx with TMJ. Pt has to do another sleep study at the clinic. Pt reports LBP is better.  Pt stood for 10 hours at work and that night, he noticed less pain with sitting. Pt slept from 10:30pm-5am without interruption with the urge to pee.  Pt had a nocturnal erection that morning with painful peeing which has been typical. The day after  sleeping a full night's sleep, pt felt less pain.      Pertinent History R groin injury 2017R ,  anxiety, stress Fx in R shin, low back as kid. 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 Pt also enjoyed cycling and stopped when the pain started. Pt used to cycle 2-3 hours 200 miles / day. Pt did not have a stretch routine. Pt completed Pelvic PT via telehealth one year and tried stretching but it made it worse.    Patient Stated Goals sit down for 2 hours  without hurting, Not  have to feel like he has to pee all the time              [facility] PT Assessment - 09/28/20 1032      Observation/Other Assessments   Observations medial collpase with marching on trampoline and report of anterior / posterior L pelvic floor pain. When cued for more pressure on lateral foot to maintain lifted arch, pt reported less pain      Coordination   Coordination and Movement Description more anterior COM without cues      Single Leg Stance   Comments 30 sec on pillow and 30 sec on even ground for LLE no UE support                           [place] [facility] Adult PT Treatment/Exercise - 09/28/20 1039      Therapeutic Activites    Other Therapeutic Activities active listening to past improvements and remaining concerns, positive reinforcement about sleep , provided information about sleep hygiene and physiology      Neuro Re-ed    Neuro Re-ed Details  cued for more WBing on lateral edge of L foot to minimzie medial arch collapse on LLE on trampline marching , cued for scapulothoracic stabilization with band while marching on trampoline , cued for viscual focus and foot propioception with SLS without UE support                       PT Long Term Goals - 09/01/20 1447      PT LONG TERM GOAL #1   Title Pt will demo decreased abdominal separation from 4 fingers width  to < 1 fingers width to improve IAP system for pelvic floor  function    Time 4    Period Weeks    Status Achieved      PT LONG TERM GOAL #2   Title Pt will demo improved posture with more anterior tilt of pelvis, increased lumbar lordosis, and no cues for proper sitting/ standing posture  in order to minimize CLBP and return to more functional activities    Time 6    Period Weeks    Status Achieved      PT LONG TERM GOAL #3   Title Pt will demo no spinal deviation and more levelled pelvic girdle across 2 visits in order to advance towards deep core/ thoracolumbar strengthening exercises    Time 2    Period Weeks    Status Achieved      PT LONG TERM GOAL #4   Title Pt will demo decreased R pelvic floor mm tightness across 2 visits in order to minimize pelvic pain and straining with bowel movements and optimize IAP system properly    Time 8    Period Weeks    Status Achieved      PT LONG TERM GOAL #5   Title Pt's FOTO score for Pain to decrease from 29pts to < 24pts and Urinary from 38 pts to < 30 pts and Bowel from 17 pts to < 12 pts and  Bowel Leakage improve 59 pts to > 64 pts    Baseline 11/8:Urinary  38 pts to 33 pts, PFDI Bowel 17pts to 4pts, bowel Leakage 59pts to 72 pts   07/21/20:    Time 10    Period Weeks    Status Partially Met      PT LONG TERM GOAL #6   Title Pt will report no urge to urinate when sitting or bending forward to put on his back pack  in order to attend meetings or leave the house    Time 8    Period Weeks    Status On-going      PT LONG TERM GOAL #7   Title Pt 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    Time 10    Period Weeks    Status New                 Plan - 09/27/20 1616    Clinical Impression Statement Discussed sleep as an important contributing factor to wellness as pt noticed less pain after getting uninterrupted sleep from 10:30 -5am one night. Pt is still undergoing further tests to rule in or out OSA. Pt was provided information about sleep and physiology/ healthy practices.   Addressing and strengthening pt's lower kinetic chain is helping pt's endurance to stand for long hours at work with less pain.   Further addressed pt's ankle and instrinic feet strength by progressing him to uneven surfaces ( marching on trampoline, SLS on pillow ). Pt demo'd improved hip alignment and required cues for more WBing at lateral side of L foot to minimize medial arch collapse. Pt continues to benefit from skilled PT.    Examination-Activity Limitations Toileting;Sit    Stability/Clinical Decision Making Evolving/Moderate complexity    Rehab Potential Good    PT Frequency 1x / week    PT Duration Other (comment)   10   PT Treatment/Interventions Neuromuscular re-education;Patient/family education;Therapeutic exercise;Scar mobilization;Taping;Manual techniques;Moist Heat;Therapeutic activities;Gait training;Balance training    Consulted and Agree with Plan of Care Patient           Patient will benefit from skilled therapeutic intervention in order to improve the following deficits and impairments:  Abnormal gait,Decreased coordination,Decreased range of motion,Difficulty walking,Decreased endurance,Decreased safety awareness,Increased muscle spasms,Decreased balance,Decreased strength,Decreased mobility,Postural dysfunction,Improper body mechanics,Decreased scar mobility,Decreased activity tolerance,Increased fascial restricitons,Hypomobility  Visit Diagnosis: Other abnormalities of gait and mobility  Pelvic pain  Sacrococcygeal disorders, not elsewhere  classified  Urinary frequency  Abnormal posture  Chronic bilateral low back pain with left-sided sciatica  Other idiopathic scoliosis, thoracolumbar region     Problem List Patient Active Problem List   Diagnosis Date Noted  . Urinary frequency 12/23/2019  . Pelvic pain 12/23/2019  . Low back pain 12/22/2019    Jerl Mina  ,PT, DPT, E-RYT  09/28/2020, 10:44 AM  [place] [facility] [address] [place], [place], [address] Phone: [phone]   Fax:  [phone]  Name: Brexton Sofia MRN: [id number] Date of Birth: 20-Oct-1970

## 2020-10-13 ENCOUNTER — Ambulatory Visit: Payer: 59 | Attending: Physician Assistant | Admitting: Physical Therapy

## 2020-10-13 ENCOUNTER — Other Ambulatory Visit: Payer: Self-pay

## 2020-10-13 DIAGNOSIS — M4125 Other idiopathic scoliosis, thoracolumbar region: Secondary | ICD-10-CM | POA: Diagnosis present

## 2020-10-13 DIAGNOSIS — R350 Frequency of micturition: Secondary | ICD-10-CM | POA: Insufficient documentation

## 2020-10-13 DIAGNOSIS — M5442 Lumbago with sciatica, left side: Secondary | ICD-10-CM | POA: Insufficient documentation

## 2020-10-13 DIAGNOSIS — R2689 Other abnormalities of gait and mobility: Secondary | ICD-10-CM | POA: Insufficient documentation

## 2020-10-13 DIAGNOSIS — G8929 Other chronic pain: Secondary | ICD-10-CM | POA: Diagnosis present

## 2020-10-13 DIAGNOSIS — R293 Abnormal posture: Secondary | ICD-10-CM | POA: Diagnosis present

## 2020-10-13 DIAGNOSIS — R102 Pelvic and perineal pain: Secondary | ICD-10-CM | POA: Insufficient documentation

## 2020-10-13 DIAGNOSIS — M533 Sacrococcygeal disorders, not elsewhere classified: Secondary | ICD-10-CM | POA: Insufficient documentation

## 2020-10-13 NOTE — Therapy (Signed)
[place] [facility] [address] [place], [place], [address] Phone: [phone]   Fax:  [phone]  Physical Therapy Treatment  Patient Details  Name: Alec Reynolds MRN: [id number] Date of Birth: 04-19-1971 Referring Provider (PT): Fosnight    Encounter Date: 10/13/2020   PT End of Session - 10/13/20 1316    Visit Number 33    Date for PT Re-Evaluation 12/22/20   PN 05/09/20, 07/21/20    Authorization Type 30 visits of PT./OT/ chiro per year (11/30)    PT Start Time 1305    PT Stop Time 1400    PT Time Calculation (min) 55 min    Activity Tolerance Patient tolerated treatment well;No increased pain    Behavior During Therapy [facility] for tasks assessed/performed           No past medical history on file.  Past Surgical History:  Procedure Laterality Date  . ELBOW SURGERY Left   . WRIST SURGERY      There were no vitals filed for this visit.   Subjective Assessment - 10/13/20 1310    Subjective Pt reported he notices that the more he stretches, the better he feels. Pt tried the trampoline one time since last session 2 two weeks ago. Pt is still working with a sleep doctor on more in-persons tests.  Pt has been sleeping more on his side and not have his pelvic / penile pain. Pt notices L thigh pain with urination in the morning but it is ok during the day. When pt touches at L PSIS, it refers to the inner groin/ thigh pain. Pt is having more nocturnal erections partial and sometimes full and he feels he is moving in the right direction.    Pertinent History R groin injury 2017R ,  anxiety, stress Fx in R shin, low back as kid. 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 Pt also enjoyed cycling and stopped when the pain started. Pt used to cycle 2-3 hours 200 miles / day. Pt did not have a stretch routine. Pt completed Pelvic PT via telehealth one year and tried  stretching but it made it worse.    Patient Stated Goals sit down for 2 hours  without hurting, Not  have to feel like he has to pee all the time              [facility] PT Assessment - 10/13/20 1333      Observation/Other Assessments   Observations simulated repeated tennis movements: L foot behind, racket in L hand, trunk rotation to the R      Coordination   Coordination and Movement Description dissoassociation trunk/pelvis in PNF      AROM   Overall AROM Comments R FADDIR hypomobile, referred pain to L      Palpation   SI assessment  L PSIS rotated posteriorly    Palpation comment sacrum limited in nutation                         [facility] Adult PT Treatment/Exercise - 10/13/20 1353      Therapeutic Activites    Other Therapeutic Activities active listening to past improvements, assessed tennis movements      Neuro Re-ed    Neuro Re-ed Details  excessive cues for diassociation of trunk and pelvis in PNF green band  Exercises   Exercises --   see pt instructions, provided cues to promote force closure and dissassociation of trunk and pelvis     Manual Therapy   Manual therapy comments PA mob Grade III at sacrum to promote nutation B                       PT Long Term Goals - 09/01/20 1447      PT LONG TERM GOAL #1   Title Pt will demo decreased abdominal separation from 4 fingers width  to < 1 fingers width to improve IAP system for pelvic floor function    Time 4    Period Weeks    Status Achieved      PT LONG TERM GOAL #2   Title Pt will demo improved posture with more anterior tilt of pelvis, increased lumbar lordosis, and no cues for proper sitting/ standing posture  in order to minimize CLBP and return to more functional activities    Time 6    Period Weeks    Status Achieved      PT LONG TERM GOAL #3   Title Pt will demo no spinal deviation and more levelled pelvic girdle across 2 visits in order to advance towards deep core/  thoracolumbar strengthening exercises    Time 2    Period Weeks    Status Achieved      PT LONG TERM GOAL #4   Title Pt will demo decreased R pelvic floor mm tightness across 2 visits in order to minimize pelvic pain and straining with bowel movements and optimize IAP system properly    Time 8    Period Weeks    Status Achieved      PT LONG TERM GOAL #5   Title Pt's FOTO score for Pain to decrease from 29pts to < 24pts and Urinary from 38 pts to < 30 pts and Bowel from 17 pts to < 12 pts and  Bowel Leakage improve 59 pts to > 64 pts    Baseline 11/8:Urinary  38 pts to 33 pts, PFDI Bowel 17pts to 4pts, bowel Leakage 59pts to 72 pts   07/21/20:    Time 10    Period Weeks    Status Partially Met      PT LONG TERM GOAL #6   Title Pt will report no urge to urinate when sitting or bending forward to put on his back pack  in order to attend meetings or leave the house    Time 8    Period Weeks    Status On-going      PT LONG TERM GOAL #7   Title 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    Time 10    Period Weeks    Status New  Plan - 10/13/20 1318    Clinical Impression Statement Pt has recovered the improvements he had after he discontinued the exercises that thee acupuncturist gave him. Pt is still waiting further tests with Sleep MD.   Addressed the pain at L PSIS which is likely associated with poor force closure of SIJ from repeated body movements in his role as a Audiological scientist. Pt required manual Tx to promote nutation of sacrum  which relieved the pain. Pt further needed cues for propicoeption training to promote pelvic girdle stability and disassociation of trunk/ pelvis. Pt continues to benefit from skilled PT to progress towards remaining goals and to minimize pain        Examination-Activity Limitations Toileting;Sit    Stability/Clinical Decision Making Evolving/Moderate complexity    Rehab Potential Good    PT Frequency 1x / week    PT Duration Other (comment)   10   PT Treatment/Interventions Neuromuscular re-education;Patient/family education;Therapeutic exercise;Scar mobilization;Taping;Manual techniques;Moist Heat;Therapeutic activities;Gait training;Balance training    Consulted and Agree with Plan of Care Patient           Patient will benefit from skilled therapeutic intervention in order to improve the following deficits and impairments:  Abnormal gait,Decreased coordination,Decreased range of motion,Difficulty walking,Decreased endurance,Decreased safety awareness,Increased muscle spasms,Decreased balance,Decreased strength,Decreased mobility,Postural dysfunction,Improper body mechanics,Decreased scar mobility,Decreased activity tolerance,Increased fascial restricitons,Hypomobility  Visit Diagnosis: Other abnormalities of gait and mobility  Pelvic pain  Sacrococcygeal disorders, not elsewhere classified  Urinary frequency  Abnormal posture  Chronic bilateral low back pain with left-sided sciatica  Other idiopathic scoliosis, thoracolumbar region     Problem List Patient Active Problem List   Diagnosis Date Noted  . Urinary frequency 12/23/2019  . Pelvic pain 12/23/2019  . Low back pain 12/22/2019    Jerl Mina 10/13/2020, 6:10 PM  [place] [facility] SERVICES [address] [place], [place], [address] Phone: [phone]   Fax:  [phone]  Name: Alec Reynolds MRN: [id number] Date of Birth: 03/19/1971

## 2020-10-13 NOTE — Patient Instructions (Addendum)
Standing PNF band exercises  BAND UNDER doorway  - you face the door  Stand 45 deg to doorway, front leg is opp of hand holding band   "Drawing a sword" " Pulling a lawnmover" Band is under feet, hold band in L hand, across body by R pocket  Press downward into the ballmounds and heels of the feet, thigh muscle active, Keep knees and hips squared the front and do not move them throughout the activity, Exhale to pull band from R pocket,  across the face to the R pocket, rotating only your ribcage to L as if "drawing a sword"    10 x 2 reps    BAND at the knob- face away from door   10 x 2 reps    __  Both exercises, anchor legs, no arched back, slight tilt of tilt, small turns like multifidis twist ( turning at ribcage not SIJ)    __   kitchen counter stretches  Hands on kitchen counter,   Palms shoulder width apart  Minisquat postion Trunk is parallel to floor  A) Pull buttocks back to lengthen spine, knees bent  3 breaths   B) Bring R hand to the L, and stretch the R side trunk  3 breaths   Brings hands to center again Do the same to the L side stretch by placing L hand on top of R   D) Modified thread the needle R hand on L thigh, L  thigh pushing out slightly as the R hands pull in,  elbow bent and pulls to theR,  Look under L armpit   Do the same to other side  ____  At work To level the PSIS and promote force closure at the [facility] iliac joint (SIJ)   Reset PSIS against wall with tilt in mini squat position

## 2020-10-20 ENCOUNTER — Ambulatory Visit: Payer: 59 | Admitting: Physical Therapy

## 2020-10-27 ENCOUNTER — Ambulatory Visit: Payer: 59 | Admitting: Physical Therapy

## 2020-10-27 ENCOUNTER — Other Ambulatory Visit: Payer: Self-pay

## 2020-10-27 DIAGNOSIS — G8929 Other chronic pain: Secondary | ICD-10-CM

## 2020-10-27 DIAGNOSIS — R2689 Other abnormalities of gait and mobility: Secondary | ICD-10-CM | POA: Diagnosis not present

## 2020-10-27 DIAGNOSIS — R293 Abnormal posture: Secondary | ICD-10-CM

## 2020-10-27 DIAGNOSIS — R102 Pelvic and perineal pain: Secondary | ICD-10-CM

## 2020-10-27 DIAGNOSIS — R350 Frequency of micturition: Secondary | ICD-10-CM

## 2020-10-27 DIAGNOSIS — M533 Sacrococcygeal disorders, not elsewhere classified: Secondary | ICD-10-CM

## 2020-10-27 NOTE — Therapy (Signed)
[place] MAIN [facility] SERVICES [address] [place], [place], [address] Phone: [phone]   Fax:  [phone]  Physical Therapy Treatment  Patient Details  Name: Alec Reynolds MRN: [id number] Date of Birth: [birth year] Referring Provider (PT): Fosnight    Encounter Date: 10/27/2020   PT End of Session - 10/27/20 1401    Visit Number 34    Date for PT Re-Evaluation 12/22/20   PN 05/09/20, 07/21/20   Authorization Type 30 visits of PT./OT/ chiro per year (12/30)    PT Start Time 1314    PT Stop Time 1409    PT Time Calculation (min) 55 min    Activity Tolerance Patient tolerated treatment well;No increased pain    Behavior During Therapy [facility] for tasks assessed/performed           No past medical history on file.  Past Surgical History:  Procedure Laterality Date  . ELBOW SURGERY Left   . WRIST SURGERY      There were no vitals filed for this visit.   Subjective Assessment - 10/27/20 1316    Subjective Pt reported he is getting 2-3 noctural erections per week but he also notices increased pain at the base of his penis and buttock area and anal area on L.  Pt also needs to pee more the morning and day of nocturnal erections and has more urge to go to urinate. Pt is waiting for insurance to approve of the sleep tests ordered by Dr.Reddy ([place]).  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 He is able to work longer hours 2-4 hours without urgency. Pt is able to sit on a stool with less pain compared to the past because he changed his laptop stand to avoid hunching. If he walks around or bend over, he gets an urge to urinate. If he walks > 30 min and on an incline , the stronger the urge.  Pt has had a second espiode of waking up in a different room in his house without being aware of how he got there.    Pertinent History R groin injury 2017R ,   anxiety, stress Fx in R shin, low back as kid. 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 Pt also enjoyed cycling and stopped when the pain started. Pt used to cycle 2-3 hours 200 miles / day. Pt did not have a stretch routine. Pt completed Pelvic PT via telehealth one year and tried stretching but it made it worse.    Patient Stated Goals sit down for 2 hours  without hurting, Not  have to feel like he has to pee all the time              [facility] PT Assessment - 10/27/20 1446      Observation/Other Assessments   Observations quad stretch with poor alignment , lumbar lordosis      Other:   Other/ Comments PNF alignment incorrect, poor diassociation between hip/ trunk      Palpation   SI assessment  PSIS bilateral                         [facility] Adult PT Treatment/Exercise - 10/27/20 1447      Therapeutic Activites    Other Therapeutic Activities active listening,  discussed progress, focus on increasing endurance and training to improve imcline ambulation for hiking, , advised pt to  communicate with sleep doctor re: nocturnal erections and waking up in different areas of his house without being aware of walking there .     Neuro Re-ed    Neuro Re-ed Details  cued for diassociation between pelvis and trunk, cued for alignment to quad stretches                       PT Long Term Goals - 09/01/20 1447      PT LONG TERM GOAL #1   Title Pt will demo decreased abdominal separation from 4 fingers width  to < 1 fingers width to improve IAP system for pelvic floor function    Time 4    Period Weeks    Status Achieved      PT LONG TERM GOAL #2   Title Pt will demo improved posture with more anterior tilt of pelvis, increased lumbar lordosis, and no cues for proper sitting/ standing posture  in order to minimize CLBP and return to more functional activities    Time 6    Period Weeks    Status  Achieved      PT LONG TERM GOAL #3   Title Pt will demo no spinal deviation and more levelled pelvic girdle across 2 visits in order to advance towards deep core/ thoracolumbar strengthening exercises    Time 2    Period Weeks    Status Achieved      PT LONG TERM GOAL #4   Title Pt will demo decreased R pelvic floor mm tightness across 2 visits in order to minimize pelvic pain and straining with bowel movements and optimize IAP system properly    Time 8    Period Weeks    Status Achieved      PT LONG TERM GOAL #5   Title Pt's FOTO score for Pain to decrease from 29pts to < 24pts and Urinary from 38 pts to < 30 pts and Bowel from 17 pts to < 12 pts and  Bowel Leakage improve 59 pts to > 64 pts    Baseline 11/8:Urinary  38 pts to 33 pts, PFDI Bowel 17pts to 4pts, bowel Leakage 59pts to 72 pts   07/21/20:    Time 10    Period Weeks    Status Partially Met      PT LONG TERM GOAL #6   Title Pt will report no urge to urinate when sitting or bending forward to put on his back pack  in order to attend meetings or leave the house    Time 8    Period Weeks    Status On-going      PT LONG TERM GOAL #7   Title 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   Pt demo'd good carry over with proper alignment of PSIS. Pt required cues for proper  technique with PNF pattern and cues for diassociation of trunk and pelvis.   Provided active listening, discussed progress, focus on increasing endurance and training to improve incline ambulation for hiking in upcoming sessions, advised pt to  communicate with sleep doctor re: nocturnal erections and waking up in different areas of his house without being aware of walking there .   Pt continues to benefit from skilled PT.    Examination-Activity Limitations Toileting;Sit    Stability/Clinical Decision Making Evolving/Moderate complexity    Rehab Potential Good    PT Frequency 1x / week    PT Duration Other (comment)   10   PT Treatment/Interventions Neuromuscular re-education;Patient/family education;Therapeutic exercise;Scar mobilization;Taping;Manual techniques;Moist Heat;Therapeutic activities;Gait training;Balance training    Consulted and Agree with Plan of Care Patient           Patient will benefit from skilled therapeutic intervention in order to improve the following deficits and impairments:  Abnormal gait,Decreased coordination,Decreased range of motion,Difficulty walking,Decreased endurance,Decreased safety awareness,Increased muscle spasms,Decreased balance,Decreased strength,Decreased mobility,Postural dysfunction,Improper body mechanics,Decreased scar mobility,Decreased activity tolerance,Increased fascial restricitons,Hypomobility  Visit Diagnosis: No diagnosis found.     Problem List Patient Active Problem List   Diagnosis Date Noted  . Urinary frequency 12/23/2019  . Pelvic pain 12/23/2019  . Low back pain 12/22/2019    Jerl Mina ,PT, DPT, E-RYT  10/27/2020, 2:52 PM  [place] MAIN [facility] SERVICES [address] [place], [place], [address] Phone: [phone]   Fax:  [phone]  Name: Alec Reynolds MRN: [id number] Date of Birth: 1971-01-10

## 2020-10-27 NOTE — Therapy (Deleted)
[place] MAIN [facility] SERVICES [address] [place], [place], [address] Phone: [phone]   Fax:  [phone]  Physical Therapy Treatment  Patient Details  Name: Alec Reynolds MRN: [id number] Date of Birth: [birth year] Referring Provider (PT): Fosnight    Encounter Date: 10/27/2020   PT End of Session - 10/27/20 1401    Visit Number 34    Date for PT Re-Evaluation 12/22/20   PN 05/09/20, 07/21/20   Authorization Type 30 visits of PT./OT/ chiro per year (12/30)    PT Start Time 1314    PT Stop Time 1409    PT Time Calculation (min) 55 min    Activity Tolerance Patient tolerated treatment well;No increased pain    Behavior During Therapy [facility] for tasks assessed/performed           No past medical history on file.  Past Surgical History:  Procedure Laterality Date  . ELBOW SURGERY Left   . WRIST SURGERY      There were no vitals filed for this visit.   Subjective Assessment - 10/27/20 1316    Subjective Pt reported he is getting 2-3 noctural erections per week but he also notices increased pain at the base of his penis and buttock area and anal area on L.  Pt also needs to pee more the morning and day of nocturnal erections and has more urge to go to urinate. Pt is waiting for insurance to approve of the sleep tests ordered by Dr.Reddy ([place]).  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 He is able to work longer hours 2-4 hours without urgency. Pt is able to sit on a stool with less pain compared to the past because he changed his laptop stand to avoid hunching. If he walks around or bend over, he gets an urge to urinate. If he walks > 30 min and on an incline , the stronger the urge.    Pertinent History R groin injury 2017R ,  anxiety, stress Fx in R shin, low back as kid. 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 Pt also enjoyed cycling and stopped when the pain started. Pt used to cycle 2-3 hours 200 miles / day. Pt did not have a stretch routine. Pt completed Pelvic PT via telehealth one year and tried stretching but it made it worse.    Patient Stated Goals sit down for 2 hours  without hurting, Not  have to feel like he has to pee all the time              [facility] PT Assessment - 10/27/20 1446      Observation/Other Assessments   Observations quad stretch with poor alignment , lumbar lordosis      Other:   Other/ Comments PNF alignment incorrect, poor diassociation between hip/ trunk      Palpation   SI assessment  PSIS bilateral                         [facility] Adult PT Treatment/Exercise - 10/27/20 1447      Therapeutic Activites    Other Therapeutic Activities active listening, discussed progress, focus on increasing endurance and training to improve imcline ambulation for hiking, communicate with sleep doctor re: nocturnal erections and waking up in  different areas of his house without being aware of walking there      Neuro Re-ed    Neuro Re-ed Details  cued for diassociation between pelvis and trunk, cued for alignment to quad stretches                       PT Long Term Goals - 09/01/20 1447      PT LONG TERM GOAL #1   Title Pt will demo decreased abdominal separation from 4 fingers width  to < 1 fingers width to improve IAP system for pelvic floor function    Time 4    Period Weeks    Status Achieved      PT LONG TERM GOAL #2   Title Pt will demo improved posture with more anterior tilt of pelvis, increased lumbar lordosis, and no cues for proper sitting/ standing posture  in order to minimize CLBP and return to more functional activities    Time 6    Period Weeks    Status Achieved      PT LONG TERM GOAL #3   Title Pt will demo no spinal deviation and more levelled pelvic girdle across 2 visits in order to advance  towards deep core/ thoracolumbar strengthening exercises    Time 2    Period Weeks    Status Achieved      PT LONG TERM GOAL #4   Title Pt will demo decreased R pelvic floor mm tightness across 2 visits in order to minimize pelvic pain and straining with bowel movements and optimize IAP system properly    Time 8    Period Weeks    Status Achieved      PT LONG TERM GOAL #5   Title Pt's FOTO score for Pain to decrease from 29pts to < 24pts and Urinary from 38 pts to < 30 pts and Bowel from 17 pts to < 12 pts and  Bowel Leakage improve 59 pts to > 64 pts    Baseline 11/8:Urinary  38 pts to 33 pts, PFDI Bowel 17pts to 4pts, bowel Leakage 59pts to 72 pts   07/21/20:    Time 10    Period Weeks    Status Partially Met      PT LONG TERM GOAL #6   Title Pt will report no urge to urinate when sitting or bending forward to put on his back pack  in order to attend meetings or leave the house    Time 8    Period Weeks    Status On-going      PT LONG TERM GOAL #7   Title 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    Time 10    Period Weeks    Status New  active listening, discussed progress, focus on increasing endurance and training to improve imcline ambulation for hiking, communicate with sleep doctor re: nocturnal erections and waking up in different areas of his house without being aware of walking there  Plan - 10/27/20 1419    Clinical Impression Statement Discussed    Examination-Activity Limitations Toileting;Sit    Stability/Clinical  Decision Making Evolving/Moderate complexity    Rehab Potential Good    PT Frequency 1x / week    PT Duration Other (comment)   10   PT Treatment/Interventions Neuromuscular re-education;Patient/family education;Therapeutic exercise;Scar mobilization;Taping;Manual techniques;Moist Heat;Therapeutic activities;Gait training;Balance training    Consulted and Agree with Plan of Care Patient           Patient will benefit from skilled therapeutic intervention in order to improve the following deficits and impairments:  Abnormal gait,Decreased coordination,Decreased range of motion,Difficulty walking,Decreased endurance,Decreased safety awareness,Increased muscle spasms,Decreased balance,Decreased strength,Decreased mobility,Postural dysfunction,Improper body mechanics,Decreased scar mobility,Decreased activity tolerance,Increased fascial restricitons,Hypomobility  Visit Diagnosis: No diagnosis found.     Problem List Patient Active Problem List   Diagnosis Date Noted  . Urinary frequency 12/23/2019  . Pelvic pain 12/23/2019  . Low back pain 12/22/2019    Jerl Mina 10/27/2020, 2:51 PM  [place] MAIN [facility] SERVICES [address] [place], [place], [address] Phone: [phone]   Fax:  [phone]  Name: Alec Reynolds MRN: [id number] Date of Birth: 02-Oct-1970

## 2020-10-27 NOTE — Patient Instructions (Addendum)
Approach to stretch like "Taffy" not "sling shot/ rubber band approach" . Focus on breathing     Quad stretch on belly: Grab ankle and lower trunk , avoid arch back, Place elbow under shoulder, activate armpit muscle on this supporting arm   Second option with quad, Strap on ankle, elbow above head    ___  PNF drawing the sword  - knee L aligned and only turn at rib not move hip with turn   _ Communicate with sleep doctor

## 2020-11-03 ENCOUNTER — Other Ambulatory Visit: Payer: Self-pay

## 2020-11-03 ENCOUNTER — Ambulatory Visit: Payer: 59 | Attending: Physician Assistant | Admitting: Physical Therapy

## 2020-11-03 DIAGNOSIS — M5442 Lumbago with sciatica, left side: Secondary | ICD-10-CM | POA: Diagnosis present

## 2020-11-03 DIAGNOSIS — G8929 Other chronic pain: Secondary | ICD-10-CM

## 2020-11-03 DIAGNOSIS — R2689 Other abnormalities of gait and mobility: Secondary | ICD-10-CM | POA: Insufficient documentation

## 2020-11-03 DIAGNOSIS — M533 Sacrococcygeal disorders, not elsewhere classified: Secondary | ICD-10-CM | POA: Insufficient documentation

## 2020-11-03 DIAGNOSIS — M4125 Other idiopathic scoliosis, thoracolumbar region: Secondary | ICD-10-CM | POA: Diagnosis present

## 2020-11-03 DIAGNOSIS — R102 Pelvic and perineal pain: Secondary | ICD-10-CM

## 2020-11-03 DIAGNOSIS — R350 Frequency of micturition: Secondary | ICD-10-CM

## 2020-11-03 DIAGNOSIS — R293 Abnormal posture: Secondary | ICD-10-CM | POA: Diagnosis present

## 2020-11-03 NOTE — Patient Instructions (Addendum)
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 and and avoid this behavior  Document urinating time to entrain bladder to close to once every 2 hours   Differentiate between just in case  Behavior versus  True body signal of full b ladder urge   ___    Blue band lunge  .face away door, hands by side, with band not moving hands  Lunges with attention to not moving front knee  Keep toes lifted to press more ballmounds  Dig balmopunds of back foot  20 reps   __ Minisquat with band under feet + bicep curl  Scoot buttocks back slight, hinge like you are looking at your reflection on a pond  Knees behind toes,  Inhale to "smell flowers"  Exhale on the rise "like rocket"  Do not lock knees, have more weight across ballmounds of feet, toes relaxed     INHALE AGAIN, EXHALE bicep curls   30 reps  ___  Tricep dips, knees bent above ankle, 90 deg to floor Over an ottoman / couch  10 reps x 3   Lower buttocks just enough before the arms shake,  Push through 4 points in palm on the rise ( activates the arm pit muscles , exhale to rise '

## 2020-11-04 NOTE — Therapy (Addendum)
[place] MAIN [facility] SERVICES [address] [place], [place], [address] Phone: [phone]   Fax:  [phone]  Physical Therapy Treatment  Patient Details  Name: Alec Reynolds MRN: [id number] Date of Birth: 1970-12-18 Referring Provider (PT): Fosnight    Encounter Date: 11/03/2020   PT End of Session - 11/03/20         Visit Number 35     Date for PT Re-Evaluation 12/22/20   PN 05/09/20, 07/21/20    Authorization Type 30 visits of PT./OT/ chiro per year (13/30)     PT Start Time 2449 ( pt arrived late)     PT Stop Time 1400     PT Time Calculation (min) 47 min     Activity Tolerance Patient tolerated treatment well;No increased pain     Behavior During Therapy [facility] for tasks assessed/performed           No past medical history on file.  Past Surgical History:  Procedure Laterality Date  . ELBOW SURGERY Left   . WRIST SURGERY      There were no vitals filed for this visit.   Subjective Assessment - 11/07/20 0853    Subjective Pt noticed less frequency and urinary this past week. 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  Pt feels pain and urgency are better with sitting . Pain is still there but frequency and urgency is more controllable. Urgency is strong in the morning.  Pt noticed pain in the R pelvic pain. Pt has been practicing stretching "like a taffy" instead of "rubberband".  Pt is aware that he pees for safety right before he leaves the house.    Pertinent History R groin injury 2017R ,  anxiety, stress Fx in R shin, low back as kid. 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 Pt also enjoyed cycling and stopped when the pain started. Pt used to cycle 2-3 hours 200 miles / day. Pt did not have a stretch routine. Pt completed Pelvic PT via telehealth one year and tried stretching but it made it worse.     Patient Stated Goals sit down for 2 hours  without hurting, Not  have to feel like he has to pee all the time              [facility] PT Assessment - 11/07/20 0855      Squat   Comments minor cues for squat,      Other:   Other/ Comments tactile cues for reistance band exercises, lunges with pioor diassociaton between pelvis and BLE                         [facility] Adult PT Treatment/Exercise - 11/07/20 0853      Therapeutic Activites    Other Therapeutic Activities active listening, positive reinforcement with progress, explained the rationale for selecting new HEP to help pt integrate back gradually to gym exercises with increased propioception and specific exercises, differentiating between natural urge signals versus "just in case" behavior      Neuro Re-ed    Neuro Re-ed Details  cued for new HEP focused on thorcolumbar strengthening, segmental coordination, squats, tricep dips  PT Long Term Goals - 09/01/20 1447      PT LONG TERM GOAL #1   Title Pt will demo decreased abdominal separation from 4 fingers width  to < 1 fingers width to improve IAP system for pelvic floor function    Time 4    Period Weeks    Status Achieved      PT LONG TERM GOAL #2   Title Pt will demo improved posture with more anterior tilt of pelvis, increased lumbar lordosis, and no cues for proper sitting/ standing posture  in order to minimize CLBP and return to more functional activities    Time 6    Period Weeks    Status Achieved      PT LONG TERM GOAL #3   Title Pt will demo no spinal deviation and more levelled pelvic girdle across 2 visits in order to advance towards deep core/ thoracolumbar strengthening exercises    Time 2    Period Weeks    Status Achieved      PT LONG TERM GOAL #4   Title Pt will demo decreased R pelvic floor mm tightness across 2 visits in order to minimize pelvic pain and straining with bowel movements and optimize IAP  system properly    Time 8    Period Weeks    Status Achieved      PT LONG TERM GOAL #5   Title Pt's FOTO score for Pain to decrease from 29pts to < 24pts and Urinary from 38 pts to < 30 pts and Bowel from 17 pts to < 12 pts and  Bowel Leakage improve 59 pts to > 64 pts    Baseline 11/8:Urinary  38 pts to 33 pts, PFDI Bowel 17pts to 4pts, bowel Leakage 59pts to 72 pts   07/21/20:    Time 10    Period Weeks    Status Partially Met      PT LONG TERM GOAL #6   Title Pt will report no urge to urinate when sitting or bending forward to put on his back pack  in order to attend meetings or leave the house    Time 8    Period Weeks    Status On-going      PT LONG TERM GOAL #7   Title 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    Time 10    Period Weeks    Status New                 Plan - 11/07/20 4081    Clinical Impression Statement Pt arrived late and thus, session was abbreviated. Pt made a milestone improvement this past week.  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 Pt feels pain and urgency are better with sitting . Pain is still  there but frequency and urgency is more controllable.    Pt requried active listening, positive reinforcement with progress. Pt was explained the rationale for selecting new HEP to help pt integrate back gradually to gym exercises with increased propioception and specific exercises, and explained how to differentiate between natural urge signals versus "just in case"  behavior to minimize urge.   Pt requried cues for propioception and coordination with thoracolumbar strengthening , lunges. Pt was provided fitness type exercises that help to strengthen posterior chain of mm to help with building upright posture and endurance to work on the tennis courts and to minimize flexed posture. Pt continues to benefit from skilled PT.    Examination-Activity Limitations Toileting;Sit    Stability/Clinical Decision Making Evolving/Moderate complexity    Rehab Potential Good    PT Frequency 1x / week    PT Duration Other (comment)   10   PT Treatment/Interventions Neuromuscular re-education;Patient/family education;Therapeutic exercise;Scar mobilization;Taping;Manual techniques;Moist Heat;Therapeutic activities;Gait training;Balance training    Consulted and Agree with Plan of Care Patient           Patient will benefit from skilled therapeutic intervention in order to improve the following deficits and impairments:  Abnormal gait,Decreased coordination,Decreased range of motion,Difficulty walking,Decreased endurance,Decreased safety awareness,Increased muscle spasms,Decreased balance,Decreased strength,Decreased mobility,Postural dysfunction,Improper body mechanics,Decreased scar mobility,Decreased activity tolerance,Increased fascial restricitons,Hypomobility  Visit Diagnosis: Other abnormalities of gait and mobility  Pelvic pain  Sacrococcygeal disorders, not elsewhere classified  Urinary frequency  Abnormal posture  Chronic bilateral low back pain with left-sided sciatica  Other idiopathic scoliosis, thoracolumbar region     Problem List Patient Active Problem List   Diagnosis Date Noted  . Urinary frequency 12/23/2019  . Pelvic pain 12/23/2019  . Low back pain 12/22/2019    Jerl Mina ,PT, DPT, E-RYT  11/07/2020, 9:01 AM  [place] MAIN [facility] SERVICES [address] [place], [place],  [address] Phone: [phone]   Fax:  [phone]  Name: Kodah Maret MRN: [id number] Date of Birth: [birth year]

## 2020-11-10 ENCOUNTER — Ambulatory Visit: Payer: 59 | Admitting: Physical Therapy

## 2020-11-17 ENCOUNTER — Ambulatory Visit: Payer: 59 | Admitting: Physical Therapy

## 2020-11-17 ENCOUNTER — Other Ambulatory Visit: Payer: Self-pay

## 2020-11-17 DIAGNOSIS — R350 Frequency of micturition: Secondary | ICD-10-CM

## 2020-11-17 DIAGNOSIS — M4125 Other idiopathic scoliosis, thoracolumbar region: Secondary | ICD-10-CM

## 2020-11-17 DIAGNOSIS — M533 Sacrococcygeal disorders, not elsewhere classified: Secondary | ICD-10-CM

## 2020-11-17 DIAGNOSIS — R2689 Other abnormalities of gait and mobility: Secondary | ICD-10-CM

## 2020-11-17 DIAGNOSIS — G8929 Other chronic pain: Secondary | ICD-10-CM

## 2020-11-17 DIAGNOSIS — R102 Pelvic and perineal pain: Secondary | ICD-10-CM

## 2020-11-17 DIAGNOSIS — R293 Abnormal posture: Secondary | ICD-10-CM

## 2020-11-17 NOTE — Patient Instructions (Addendum)
Notice how pelvic floor muscle feels with the [facility] monitor you are using when it tells you are alarmed versus calm   Use heat pack no more than 15 min per day   Gentle fascial glide with L hand to mobilize pudendal nerve ( dorsal branch and deep / superficial branch at perineal )

## 2020-11-18 NOTE — Therapy (Signed)
[place] MAIN [facility] SERVICES [address] [place], [place], [address] Phone: [phone]   Fax:  [phone]  Physical Therapy Treatment  Patient Details  Name: Alec Reynolds MRN: [id number] Date of Birth: 07/04/1970 Referring Provider (PT): Fosnight    Encounter Date: 11/17/2020   PT End of Session - 11/17/20 1315    Visit Number 36    Date for PT Re-Evaluation 12/22/20   PN 05/09/20, 07/21/20   Authorization Type 30 visits of PT./OT/ chiro per year (14/30)    PT Start Time 1311    PT Stop Time 1409    PT Time Calculation (min) 58 min    Activity Tolerance Patient tolerated treatment well;No increased pain    Behavior During Therapy [facility] for tasks assessed/performed           No past medical history on file.  Past Surgical History:  Procedure Laterality Date  . ELBOW SURGERY Left   . WRIST SURGERY      There were no vitals filed for this visit.   Subjective Assessment - 11/17/20 1314    Subjective Pt noticed his pelvic pain is not as constant and occurs when he has been running around. Pt has more urgency when he sitting down. Pt has less urgency when he is bending down with correct technique. After he gets nocturnal erections, the Sx are worst. Pt notices swelling near his penis when he is stretching adductors or if he sits down or sitting in a soft couch.  Pt has not been documenting on positive moments and achievements in his calendar the past weeks when he is feeling depressed . Pt notice he is spending less time feeling ashamed and worrying about what others think. He states he is learning the relationship between breathing the slower he breaths, the [employer] monitors the calmer he is. Pt uses his heat pad 15-30 min durign the week, 3 hours on the weekend    Pertinent History R groin injury 2017R ,  anxiety, stress Fx in R shin, low back as kid. 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 Pt also enjoyed cycling and stopped when the pain started. Pt used to cycle 2-3 hours 200 miles / day. Pt did not have a stretch routine. Pt completed Pelvic PT via telehealth one year and tried stretching but it made it worse.    Patient Stated Goals sit down for 2 hours  without hurting, Not  have to feel like he has to pee all the time              [facility] PT Assessment - 11/18/20 1145      Sensation   Additional Comments dull, sharp, soft sensory testing ( pre Tx: decreased sensation on L), ( Post Tx: increased senation bilaterally intact)   perineum                     Pelvic Floor Special Questions - 11/18/20 1146    External Perineal Exam without clothing: tightness at perineum and tenderenss when the posterior pelvic tilt, less tightness/ tenderness in anterior tilt   tightness at deep transverse perineal L, no tightness/ tenderness of posterior mm            [facility] Adult PT Treatment/Exercise - 11/18/20 1146      Therapeutic Activites    Other Therapeutic Activities provided sensaory retraining, explained and  showed anatomy for sensory retraining, reinforced positive/ gratitude practice, discussed once a month appts due to limitations to # of visits per year which pt consented and agreed to maintain his HEP, discussed to limit his time using heat pad      Neuro Re-ed    Neuro Re-ed Details  cued for pelvic tilt, self- mobilization technique to release L perineal mm tightness      Manual Therapy   Internal Pelvic Floor STM/MWM at problem areas noted in assessment                       PT Long Term Goals - 09/01/20 1447      PT LONG TERM GOAL #1   Title Pt will demo decreased abdominal separation from 4 fingers width  to < 1 fingers width to improve IAP system for pelvic floor function    Time 4    Period Weeks    Status Achieved      PT LONG TERM GOAL #2   Title Pt will demo improved posture with more anterior tilt  of pelvis, increased lumbar lordosis, and no cues for proper sitting/ standing posture  in order to minimize CLBP and return to more functional activities    Time 6    Period Weeks    Status Achieved      PT LONG TERM GOAL #3   Title Pt will demo no spinal deviation and more levelled pelvic girdle across 2 visits in order to advance towards deep core/ thoracolumbar strengthening exercises    Time 2    Period Weeks    Status Achieved      PT LONG TERM GOAL #4   Title Pt will demo decreased R pelvic floor mm tightness across 2 visits in order to minimize pelvic pain and straining with bowel movements and optimize IAP system properly    Time 8    Period Weeks    Status Achieved      PT LONG TERM GOAL #5   Title Pt's FOTO score for Pain to decrease from 29pts to < 24pts and Urinary from 38 pts to < 30 pts and Bowel from 17 pts to < 12 pts and  Bowel Leakage improve 59 pts to > 64 pts    Baseline 11/8:Urinary  38 pts to 33 pts, PFDI Bowel 17pts to 4pts, bowel Leakage 59pts to 72 pts   07/21/20:    Time 10    Period Weeks    Status Partially Met      PT LONG TERM GOAL #6   Title Pt will report no urge to urinate when sitting or bending forward to put on his back pack  in order to attend meetings or leave the house    Time 8    Period Weeks    Status On-going      PT LONG TERM GOAL #7   Title Pt 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    Time 10    Period Weeks    Status New                 Plan - 11/17/20 1316    Clinical Impression Statement Pt  showed increased anterior L pelvic mm tightness which decreased post Tx. Pt required cues to learn to find anterior tilt of pelvis to minimize mm tightness. Previous mm tightness at bulbospongisous were not present which indicates his HEP and compliance are helpful.   Provided sensory retraining, explained and showed anatomy for sensory retraining, reinforced positive/ gratitude practice, discussed once a month appts due to limitations to # of visits per year which pt consented and agreed to maintain his HEP.  Also discussed to limit his time using heat pad as skin by posterior thigh appeared with some discoloration.   Pt continues to benefit from skilled PT.      Examination-Activity Limitations Toileting;Sit    Stability/Clinical Decision Making Evolving/Moderate complexity    Rehab Potential Good    PT Frequency 1x / week    PT Duration Other (comment)   10   PT Treatment/Interventions Neuromuscular re-education;Patient/family education;Therapeutic exercise;Scar mobilization;Taping;Manual techniques;Moist Heat;Therapeutic activities;Gait training;Balance training    Consulted and Agree with Plan of Care Patient           Patient will benefit from skilled therapeutic intervention in order to improve the following deficits and impairments:  Abnormal gait,Decreased coordination,Decreased range of motion,Difficulty walking,Decreased endurance,Decreased safety awareness,Increased muscle spasms,Decreased balance,Decreased strength,Decreased mobility,Postural dysfunction,Improper body mechanics,Decreased scar mobility,Decreased activity tolerance,Increased fascial restricitons,Hypomobility  Visit Diagnosis: Other abnormalities of gait and mobility  Pelvic pain  Sacrococcygeal disorders, not elsewhere classified  Urinary frequency  Abnormal posture  Chronic bilateral low back pain with left-sided sciatica  Other idiopathic scoliosis, thoracolumbar region     Problem List Patient  Active Problem List   Diagnosis Date Noted  . Urinary frequency 12/23/2019  . Pelvic pain 12/23/2019  . Low back pain 12/22/2019    Jerl Mina ,PT, DPT, E-RYT  11/18/2020, 11:50 AM  [place] MAIN [facility] SERVICES [address] [place], [place], [address] Phone: [phone]   Fax:  [phone]  Name: Amauris Debois MRN: [id number] Date of Birth: [birth year]

## 2020-11-24 ENCOUNTER — Ambulatory Visit: Payer: 59 | Admitting: Physical Therapy

## 2020-12-29 ENCOUNTER — Ambulatory Visit: Payer: 59 | Attending: Physician Assistant | Admitting: Physical Therapy

## 2020-12-29 ENCOUNTER — Other Ambulatory Visit: Payer: Self-pay

## 2020-12-29 DIAGNOSIS — R293 Abnormal posture: Secondary | ICD-10-CM

## 2020-12-29 DIAGNOSIS — M533 Sacrococcygeal disorders, not elsewhere classified: Secondary | ICD-10-CM

## 2020-12-29 DIAGNOSIS — R102 Pelvic and perineal pain: Secondary | ICD-10-CM | POA: Diagnosis present

## 2020-12-29 DIAGNOSIS — R350 Frequency of micturition: Secondary | ICD-10-CM | POA: Diagnosis present

## 2020-12-29 DIAGNOSIS — R2689 Other abnormalities of gait and mobility: Secondary | ICD-10-CM

## 2020-12-30 ENCOUNTER — Encounter (HOSPITAL_BASED_OUTPATIENT_CLINIC_OR_DEPARTMENT_OTHER): Payer: Self-pay

## 2020-12-30 DIAGNOSIS — G4710 Hypersomnia, unspecified: Secondary | ICD-10-CM

## 2020-12-30 NOTE — Therapy (Addendum)
[place] MAIN [facility] SERVICES [address] [place], [place], [address] Phone: [phone]   Fax:  [phone]  Physical Therapy Treatment  Patient Details  Name: Alec Reynolds MRN: [id number] Date of Birth: 1970/12/06 No data recorded  Encounter Date: 12/29/2020   PT End of Session - 12/30/20 1113     Visit Number 37    Date for PT Re-Evaluation 06/16/21   PN 05/09/20, 07/21/20, 12/29/20   Authorization Type 30 visits of PT./OT/ chiro per year (15/30)    PT Start Time 1315    PT Stop Time 1400    PT Time Calculation (min) 45 min    Activity Tolerance Patient tolerated treatment well;No increased pain    Behavior During Therapy [facility] for tasks assessed/performed             No past medical history on file.  Past Surgical History:  Procedure Laterality Date   ELBOW SURGERY Left    WRIST SURGERY      There were no vitals filed for this visit.   Subjective Assessment - 12/29/20 1316     Subjective Pt reported he notices tightenss in the groin when serving tennis. Pt notices his penis is turns to the L at the end of the stream of urination.   Sometimes when inserting suppository and always with bowel movements, pain is at one place in anus. It is a sharp pain.  BMs are getting easier and larger in size. Using suppositories every night. Pt had started taking diazapem suppositories when he could not sleep but he is sleeping better and pain does not interrupt  his sleep like it used to when he was prescribed to take the suppositories to minimzie pelvic pain to help with sleep. Pt is  still waiting for more sleep studies.    Pertinent History R groin injury 2017R ,  anxiety, stress Fx in R shin, low back as kid. 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 Pt also enjoyed cycling and stopped when the pain started. Pt used to cycle 2-3 hours 200 miles / day. Pt did not have a  stretch routine. Pt completed Pelvic PT via telehealth one year and tried stretching but it made it worse.    Patient Stated Goals sit down for 2 hours  without hurting, Not  have to feel like he has to pee all the time                [facility] PT Assessment - 12/30/20 1143       Observation/Other Assessments   Observations posterior tilt of pelvis, slumped sitting                        Pelvic Floor Special Questions - 12/30/20 1120     External Palpation subpubic area L tightnes > R, tightness/ tenderness at junction of ischioacvernosus/ bulbospongiosus L ( decresed post Tx)    Pelvic Floor Internal Exam pt consented verbally and had no contraindications    Exam Type Rectal    Palpation tightness at junction of iliococcygeus/ ischiococcgyeus, tenderness less with cue for mot thoracic extension and anterior tilt of pelvis,               [facility] Adult PT Treatment/Exercise - 12/30/20 1122       Therapeutic Activites    Other Therapeutic Activities provided encouragement, guided L  pelvic floor stretches      Neuro Re-ed    Neuro Re-ed Details  cued for thoracic extension for optimal lengthening of pelvic floor      Manual Therapy   Internal Pelvic Floor STM/MWM at problem areas noted in assessment                         PT Long Term Goals - 12/30/20 1143       PT LONG TERM GOAL #1   Title Pt will demo decreased abdominal separation from 4 fingers width  to < 1 fingers width to improve IAP system for pelvic floor function    Time 4    Period Weeks    Status Achieved      PT LONG TERM GOAL #2   Title Pt will demo improved posture with more anterior tilt of pelvis, increased lumbar lordosis, and no cues for proper sitting/ standing posture  in order to minimize CLBP and return to more functional activities    Time 6    Period Weeks    Status Achieved      PT LONG TERM GOAL #3   Title Pt will demo no spinal deviation and more levelled  pelvic girdle across 2 visits in order to advance towards deep core/ thoracolumbar strengthening exercises    Time 2    Period Weeks    Status Achieved      PT LONG TERM GOAL #4   Title Pt will demo decreased R pelvic floor mm tightness across 2 visits in order to minimize pelvic pain and straining with bowel movements and optimize IAP system properly    Time 8    Period Weeks    Status Achieved      PT LONG TERM GOAL #5   Title Pt's FOTO score for Pain to decrease from 29pts to < 24pts and Urinary from 38 pts to < 30 pts and Bowel from 17 pts to < 12 pts and  Bowel Leakage improve 59 pts to > 64 pts    Baseline 11/8:Urinary  38 pts to 33 pts, PFDI Bowel 17pts to 4pts, bowel Leakage 59pts to 72 pts   07/21/20:    Time 10    Period Weeks    Status Partially Met      Additional Long Term Goals   Additional Long Term Goals Yes      PT LONG TERM GOAL #6   Title Pt will report no urge to urinate when sitting or bending forward to put on his back pack  in order to attend meetings or leave the house    Time 8    Period Weeks    Status On-going      PT LONG TERM GOAL #7   Title 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  Time 10    Period Weeks    Status Achieved      PT LONG TERM GOAL  #10   TITLE Pt will be able to hike for 46mles on low grade trail and/or 2 miles on a trail with inclines and report no pain .    Time 6    Period Months    Status New    Target Date 06/16/21                   Plan - 12/30/20 1114     Clinical Impression  Statement Pt  has achieved 4/9 goals and is making progress with less pain in sitting, standing for longer duration when on the tennis court and being on his feet all day, and improved bowel movements.  Plan to continue to address remaining issues for once a month sessions given his insurance coverage is limited to 30 visits per year.   Pt has been sleeping better and the pelvic pain does not interrupt his sleep like did before. He still uses the suppositories which he explained was originally prescribed as a strategy to help improve his sleep by relaxing his pelvic floor. However, he reports there is pain with inserting the suppositories. Emailed Dr. FWilleen Casswho prescribed him this medication to see about weaning pt off Diazapem suppositories in order to not create more pain areas as great strides have been so far.   Suggested to pt to undergo sleep studies months ago and his PCP has started the process for him to work with a sleep clinic. Pt is waiting to get scheduled for appointment.   Frequency of visits decreased to monthly given limited visits by insurance coverage. Pt remains IND with his HEP and managed the past month on his own with no relapse of Sx.  Pt continues to remain compliant with PT HEP.  Interdisciplinary approaches have yielded great outcome as pt continues to work with a psychotherapist. Pt also tried acupuncture as suggested by his PCP which helped pt to continue learning relaxation of his body but these visits ended as recommended by PT when acupuncturist was providing pt exercises to perform which caused more tightening of his pelvic floor mm and relapse of his pelvic pain. Pt has since regained improvement of pelvic pain. Pt has learned and maintains self-care practices to help down regulate his nervous system.  Pt has responded positively to biopsychosocial approaches and regional interdependent approaches throughout every visit the past months. Pt's lower kinetic chain deficits (  fallen arches, PF strength, SIJ/ hip hypomobility, mm tightness of global leg mm) are being addressed and pt has shown improvements in these areas.   Plan to continue addressing urgency, deviation of penis when urinating with burning sensation. 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        Plan to progress his POC with direction towards fitness and hobbies ( hiking) as pain management and function are improving well.   Pt continues to benefit from skilled PT.    Examination-Activity Limitations Toileting;Sit    Stability/Clinical Decision Making Evolving/Moderate complexity    Rehab Potential Good    PT Frequency 1x / week    PT Duration Other (comment)   10   PT Treatment/Interventions Neuromuscular re-education;Patient/family education;Therapeutic exercise;Scar mobilization;Taping;Manual techniques;Moist Heat;Therapeutic activities;Gait  training;Balance training    Consulted and Agree with Plan of Care Patient             Patient will benefit from skilled therapeutic intervention in order to improve the following deficits and impairments:  Abnormal gait, Decreased coordination, Decreased range of motion, Difficulty walking, Decreased endurance, Decreased safety awareness, Increased muscle spasms, Decreased balance, Decreased strength, Decreased mobility, Postural dysfunction, Improper body mechanics, Decreased scar mobility, Decreased activity tolerance, Increased fascial restricitons, Hypomobility  Visit Diagnosis: Other abnormalities of gait and mobility  Pelvic pain  Sacrococcygeal disorders, not elsewhere classified  Urinary frequency  Abnormal posture     Problem List Patient Active Problem List   Diagnosis Date Noted   Urinary frequency 12/23/2019   Pelvic pain 12/23/2019   Low  back pain 12/22/2019    Jerl Mina ,PT, DPT, E-RYT  12/30/2020, 12:08 PM  [place] MAIN [facility] SERVICES [address] [place], [place], [address] Phone: [phone]   Fax:  [phone]  Name: Alec Reynolds MRN: [id number] Date of Birth: 08-07-1970

## 2020-12-30 NOTE — Patient Instructions (Signed)
L pelvic floor stretches:  Half butterfly ( L knee bent), towel under L thigh, L elbow stabilized, dragging thigh up to lengthen pelvic floor L 10 reps   Flat palm light pressure over pubic bone, knee straight, roll toes side to side to roll whole leg,  To mobilize superficial fascia area. 10 reps  ___  Keep up the great practices and celebrate your improvements in sleep, standing longer on the tennis court, and sitting tolerance!  Keep up with gratitude journal! Speak to doctor about suppository medication. I will also email her about it.

## 2020-12-30 NOTE — Addendum Note (Signed)
Addended by: Mariane Masters on: 12/30/2020 12:15 PM   Modules accepted: Orders

## 2021-01-30 ENCOUNTER — Other Ambulatory Visit: Payer: Self-pay

## 2021-01-30 ENCOUNTER — Ambulatory Visit (HOSPITAL_BASED_OUTPATIENT_CLINIC_OR_DEPARTMENT_OTHER): Payer: 59 | Attending: Internal Medicine | Admitting: Sleep Medicine

## 2021-01-30 DIAGNOSIS — G4719 Other hypersomnia: Secondary | ICD-10-CM | POA: Diagnosis present

## 2021-01-30 DIAGNOSIS — R0683 Snoring: Secondary | ICD-10-CM | POA: Diagnosis not present

## 2021-01-30 DIAGNOSIS — G4710 Hypersomnia, unspecified: Secondary | ICD-10-CM

## 2021-01-31 ENCOUNTER — Ambulatory Visit (HOSPITAL_BASED_OUTPATIENT_CLINIC_OR_DEPARTMENT_OTHER): Payer: 59 | Attending: Internal Medicine | Admitting: Sleep Medicine

## 2021-01-31 DIAGNOSIS — G4710 Hypersomnia, unspecified: Secondary | ICD-10-CM | POA: Diagnosis present

## 2021-01-31 NOTE — Procedures (Signed)
   NAME: Alec Reynolds DATE OF BIRTH:  Aug 29, 1970 MEDICAL RECORD NUMBER [id number]  LOCATION: [facility]  PHYSICIAN: Kekai Geter D Copper Basnett  DATE OF STUDY: 01/30/2021  SLEEP STUDY TYPE: Nocturnal Polysomnogram               REFERRING PHYSICIAN: Tempie Hoist, MD  CLINICAL INFORMATION [name] is a 50 year old Male and was referred to the sleep center for evaluation of obstructive sleep apnea.   MEDICATIONS Patient self administered medications include: Diazapam, Gabapentin, super omega. No sleep medicine administered.  SLEEP STUDY TECHNIQUE A multi-channel overnight Polysomnography study was performed. The channels recorded and monitored were central and occipital EEG, electrooculogram (EOG), submentalis EMG (chin), nasal and oral airflow, thoracic and abdominal wall motion, anterior tibialis EMG, snore microphone, electrocardiogram, and a pulse oximetry. SLEEP ARCHITECTURE The study was initiated at 10:16:41 PM and terminated at 6:02:11 AM. The total recorded time was 465.5 minutes. EEG confirmed total sleep time was 360.5 minutes yielding a sleep efficiency of 77.4%. Sleep onset after lights out was 13.4 minutes with a REM latency of 120.5 minutes. The patient spent 7.6% of the night in stage N1 sleep, 77.0% in stage N2 sleep, 0.0% in stage N3 and 15.4% in REM. Wake after sleep onset (WASO) was 91.6 minutes. The Arousal Index was 14.5/hour. RESPIRATORY PARAMETERS There were a total of 9 respiratory disturbances out of which 0 were apneas (0 obstructive, 0 mixed, 0 central) and 9 hypopneas. The apnea/hypopnea index (AHI) was 1.5 events/hour. The central sleep apnea index was 0 events/hour. The REM AHI was 7.6 events/hour and NREM AHI was 0.4 events/hour. The supine AHI was 1.6 events/hour and the non supine AHI was 0.9 supine during 81.67% of sleep. Respiratory disturbance index was 4.5, associated with oxygen desaturation down to a nadir of 81.0% during  sleep. The mean oxygen saturation during the study was 92.8%.  LEG MOVEMENT DATA The total leg movements were 0 with a resulting leg movement index of 0.0/hr .Associated arousal with leg movement index was 0.0/hr.  CARDIAC DATA The underlying cardiac rhythm was most consistent with sinus rhythm. Mean heart rate during sleep was 52.6 bpm.  IMPRESSIONS - No Significant Obstructive Sleep apnea (OSA) - Moderate Snoring Noted.  DIAGNOSIS - Primary Snoring (R06.83) - Excessive Daytime Sleepiness RECOMMENDATIONS - Multiple Sleep Latency Test to evaluate for Narcolepsy. - Oral appliance may be considered for snoring.  - Avoid alcohol, sedatives and other CNS depressants that may worsen sleep apnea and disrupt normal sleep architecture. - Sleep hygiene should be reviewed to assess factors that may improve sleep quality. - Weight management and regular exercise should be initiated or continued.  Mutasim Tuckey D Keiaira Donlan Diplomate, American Board of Internal Medicine  ELECTRONICALLY SIGNED ON:  01/31/2021, 2:51 PM [facility] PH: [phone]   FX: [phone] ACCREDITED BY THE AMERICAN ACADEMY OF SLEEP MEDICINE

## 2021-02-06 NOTE — Procedures (Signed)
    NAME: Alec Reynolds DATE OF BIRTH:  12-30-1970 MEDICAL RECORD NUMBER [id number]  LOCATION: [facility]  PHYSICIAN: Quinterious Walraven D Joeziah Voit  DATE OF STUDY: 01/31/2021  SLEEP STUDY TYPE: Multiple Sleep Latency Test               REFERRING PHYSICIAN: Tempie Hoist, MD  CLINICAL INFORMATION The patient was referred to the sleep center for evaluation of obstructive sleep apnea. MEDICATIONS Patient self administered medications include: Diazepam, Gabapentin and super omega. No sleep medicine administered.  SLEEP STUDY TECHNIQUE A multiple sleep latency test was performed. The channels recorded and monitored were central and occipital EEG, electrooculogram (EOG), submentalis EMG (chin), and electrocardiogram. IMPRESSIONS - No sleep onset REMs present. This study does not suggest narcolepsy. - Total number of naps attempted: 5. Total number of naps with sleep attained: 3 - Normal mean sleep latency time of 16 minutes and 43 seconds. - This does not suggest pathologic sleepiness. DIAGNOSIS - Excessive daytime sleepiness RECOMMENDATIONS - Return for follow up to evaluate other causes of excessive daytime sleepiness.  [name] D [name] Diplomate, American Board of Internal Medicine  ELECTRONICALLY SIGNED ON:  02/06/2021, 2:22 PM [facility] PH: [phone]   FX: [phone] ACCREDITED BY THE AMERICAN ACADEMY OF SLEEP MEDICINE

## 2021-02-16 ENCOUNTER — Other Ambulatory Visit: Payer: Self-pay

## 2021-02-16 ENCOUNTER — Ambulatory Visit: Payer: 59 | Attending: Physician Assistant | Admitting: Physical Therapy

## 2021-02-16 DIAGNOSIS — R350 Frequency of micturition: Secondary | ICD-10-CM | POA: Diagnosis present

## 2021-02-16 DIAGNOSIS — R102 Pelvic and perineal pain: Secondary | ICD-10-CM

## 2021-02-16 DIAGNOSIS — R2689 Other abnormalities of gait and mobility: Secondary | ICD-10-CM

## 2021-02-16 DIAGNOSIS — M533 Sacrococcygeal disorders, not elsewhere classified: Secondary | ICD-10-CM

## 2021-02-17 NOTE — Therapy (Signed)
[place] MAIN [facility] SERVICES [address] [place], [place], [address] Phone: [phone]   Fax:  [phone]  Physical Therapy Treatment  Patient Details  Name: Alec Reynolds MRN: [id number] Date of Birth: 16-Apr-1971 No data recorded  Encounter Date: 02/16/2021   PT End of Session - 02/17/21 1042     Visit Number 38    Date for PT Re-Evaluation 06/16/21   PN 05/09/20, 07/21/20, 12/29/20   Authorization Type 30 visits of PT./OT/ chiro per year (15/30)    PT Start Time 1310    PT Stop Time 1405    PT Time Calculation (min) 55 min    Activity Tolerance Patient tolerated treatment well;No increased pain    Behavior During Therapy [facility] for tasks assessed/performed             No past medical history on file.  Past Surgical History:  Procedure Laterality Date   ELBOW SURGERY Left    WRIST SURGERY      There were no vitals filed for this visit.   Subjective Assessment - 02/17/21 0923     Subjective The month of July has been up and down. Standing up on the tennis court has been less painful. Sitting is my nemesis. It can range from uncomfortable too painful. Also, it increases my urgency and frequency to urinate.     Earlier in the month I was generally making improvements. Standing was a lot more comfortable and sitting was bearable if I had the right seat. I had less pain, urgency to urinate and frequency to urinate while standing up on the tennis courts. If I had to exert myself on the tennis court, I was better.  The pain around the base of the left side of my penis was getting better.  Also bending down to pick things up was less painful. Pain in the top of my left buttock had been steadily increasing but bearable.  The pain I did have was more localized. I can show you where at this week's appointment.     When pushing on the pain in my left buttock it felt I get the same feeling as when you have a spot you need to massage in your  thigh, hamstring or anywhere else on the body. So.I thought doing some rolling with a tennis ball would be a good idea.  Oh. how I was wrong. After rolling nervy pain in my buttock, perineum and left side of penis starting to ramp up.      By the next day, I was in a lot of pain, and it was reminiscent of when I was really struggling. I guess I really aggravated the pudendal nerve. My anus on the left side became very painful to the touch. Bowel movements are either painful or numb. When I would touch the left side of anus is painful. Stretching for tennis shots, is painful on left and right side.     Since, this flare up, I have had a lot of gas.      Things are starting to calm down.  I 'm grateful that this part of my journey has really shown me that if I can calm down the left pudendal nerve, everything can get better. I guess the trick is, how to get this nerve better.    Pertinent History R groin injury 2017R ,  anxiety, stress Fx in R shin, low back as kid. 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 Pt also enjoyed cycling and stopped when the pain started. Pt used to cycle 2-3 hours 200 miles / day. Pt did not have a stretch routine. Pt completed Pelvic PT via telehealth one year and tried stretching but it made it worse.    Patient Stated Goals sit down for 2 hours  without hurting, Not  have to feel like he has to pee all the time                [facility] PT Assessment - 02/17/21 1034       Observation/Other Assessments   Observations L big toe less deviated to digit II, more toe abduction , less medial arch collapse      Coordination   Coordination and Movement Description dynamic push off delayed in exercise on levelled ground,  less medial collapse on SLS BOSU ball      Strength   Overall Strength Comments no UE support, PF MMT 5/5 , R 10 reps, L 12 reps                        Pelvic Floor Special  Questions - 02/17/21 1042     External Palpation through clothing, tightness at anterior pelvic floor, pt able to relax without manual Tx               [facility] Adult PT Treatment/Exercise - 02/17/21 1040       Therapeutic Activites    Other Therapeutic Activities active listening to recent flare up, provided positive reinforcement for continued dedicaiton to his self care practices and working with psychotherapist, recommende to continue retraining brain on pain , techniques with relaxation , gratitude and positive achievement with PT journal entries      Neuro Re-ed    Neuro Re-ed Details  cued for pushoff for more plantar fascia strengtehning in new upright exercises                         PT Long Term Goals - 12/30/20 1143       PT LONG TERM GOAL #1   Title Pt will demo decreased abdominal separation from 4 fingers width  to < 1 fingers width to improve IAP system for pelvic floor function    Time 4    Period Weeks    Status Achieved      PT LONG TERM GOAL #2   Title Pt will demo improved posture with more anterior tilt of pelvis, increased lumbar lordosis, and no cues for proper sitting/ standing posture  in order to minimize CLBP and return to more functional activities    Time 6    Period Weeks    Status Achieved      PT LONG TERM GOAL #3   Title Pt will demo no spinal deviation and more levelled pelvic girdle across 2 visits in order to advance towards deep core/ thoracolumbar strengthening exercises    Time 2    Period Weeks    Status Achieved      PT LONG TERM GOAL #4   Title Pt will demo decreased R pelvic floor mm tightness across 2 visits in order to minimize pelvic pain and straining with bowel movements and optimize IAP system properly    Time 8    Period Weeks    Status Achieved      PT LONG TERM  GOAL #5   Title Pt's FOTO score for Pain to decrease from 29pts to < 24pts and Urinary from 38 pts to < 30 pts and Bowel from 17 pts to < 12 pts  and  Bowel Leakage improve 59 pts to > 64 pts    Baseline 11/8:Urinary  38 pts to 33 pts, PFDI Bowel 17pts to 4pts, bowel Leakage 59pts to 72 pts   07/21/20:    Time 10    Period Weeks    Status Partially Met      Additional Long Term Goals   Additional Long Term Goals Yes      PT LONG TERM GOAL #6   Title Pt will report no urge to urinate when sitting or bending forward to put on his back pack  in order to attend meetings or leave the house    Time 8    Period Weeks    Status On-going      PT LONG TERM GOAL #7   Title 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    Time 10    Period Weeks    Status Achieved      PT LONG TERM GOAL  #10   TITLE Pt will be able to hike for 35mles on low grade trail and/or 2 miles on a trail with inclines and report no pain .    Time 6    Period Months    Status New    Target Date 06/16/21                   Plan - 02/17/21 1043     Clinical Impression Statement Pt had a flare up after trying the use of tennis balls to relief pelvic floor, a technique he found on his own. Pt is able to calm the pain down on his own.  Prior to flare up , pt had improvements in the past months with longer endurance on tennis court. Urgency and sitting remains issues but pt is continuing with his self care practices and PT and psychotherapy. Pt showed today significantly increased planat arch , toe abduction, and less pelvic floor mm tightness. 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 Provided  active listening to recent flare up, provided positive reinforcement for continued dedicaiton to his self care practices and working with psychotherapist, recommended to continue retraining brain on pain , techniques with relaxation , logging gratitude and positive achievements into journal entries. Pt voiced agreement. Pt continues to benefit from skilled PT.    Examination-Activity Limitations Toileting;Sit    Stability/Clinical Decision Making Evolving/Moderate complexity    Clinical Decision Making Moderate    Rehab Potential Good  PT Frequency Monthy    PT Duration Other (comment)   6   PT Treatment/Interventions Neuromuscular re-education;Patient/family education;Therapeutic exercise;Scar mobilization;Taping;Manual techniques;Moist Heat;Therapeutic activities;Gait training;Balance training    Consulted and Agree with Plan of Care Patient             Patient will benefit from skilled therapeutic intervention in order to improve the following deficits and impairments:  Abnormal gait, Decreased coordination, Decreased range of motion, Difficulty walking, Decreased endurance, Decreased safety awareness, Increased muscle spasms, Decreased balance, Decreased strength, Decreased mobility, Postural dysfunction, Improper body mechanics, Decreased scar mobility, Decreased activity tolerance, Increased fascial restricitons, Hypomobility  Visit Diagnosis: Other abnormalities of gait and mobility  Pelvic pain  Urinary frequency  Sacrococcygeal disorders, not elsewhere classified     Problem List Patient Active Problem List   Diagnosis Date Noted   Excessive sleepiness 01/30/2021   Urinary frequency 12/23/2019   Pelvic pain 12/23/2019   Low back pain 12/22/2019    Jerl Mina ,PT, DPT, E-RYT  02/17/2021, 10:49 AM  [place] [address] [place], [place], [address] Phone: [phone]   Fax:  [phone]  Name: Alec Reynolds MRN: [id number] Date of Birth: [birth year]

## 2021-03-07 ENCOUNTER — Ambulatory Visit: Payer: 59 | Admitting: Physical Therapy

## 2021-03-16 ENCOUNTER — Encounter: Payer: 59 | Admitting: Physical Therapy

## 2021-03-21 ENCOUNTER — Ambulatory Visit: Payer: 59 | Admitting: Physical Therapy

## 2021-03-28 IMAGING — CR LUMBAR SPINE - COMPLETE 4+ VIEW
5 series · 5 of 5 positions shown · non-contrast
Comparison: None.

CLINICAL DATA: Lumbar radiculopathy on the right

EXAM:
LUMBAR SPINE - COMPLETE 4+ VIEW

[t lumbar spine ap]
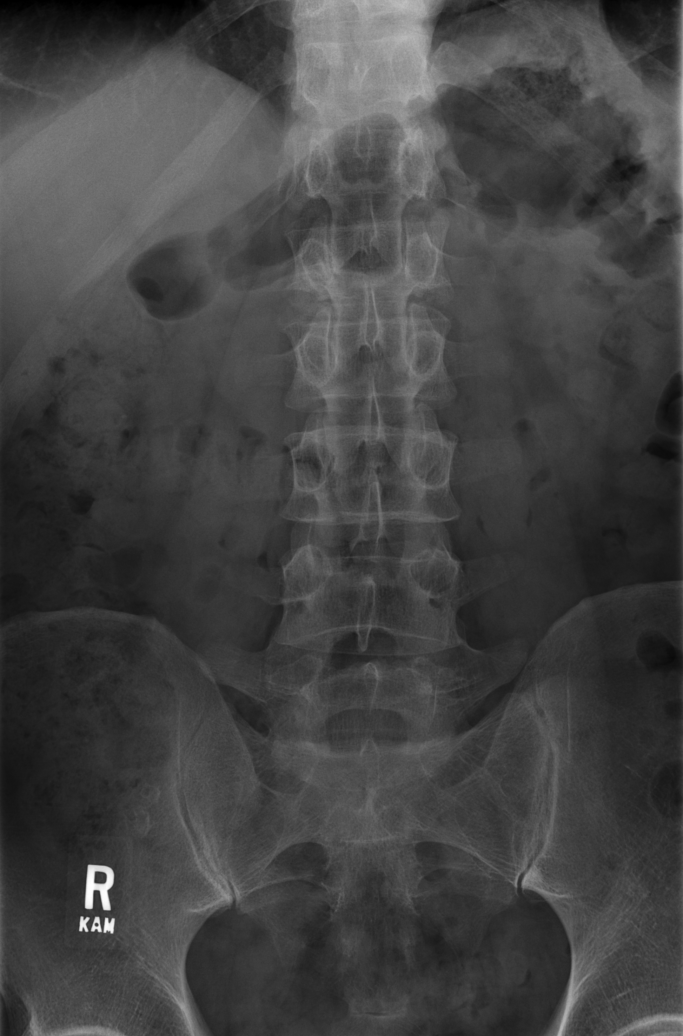

[t lumbar spine obl (1 of 2)]
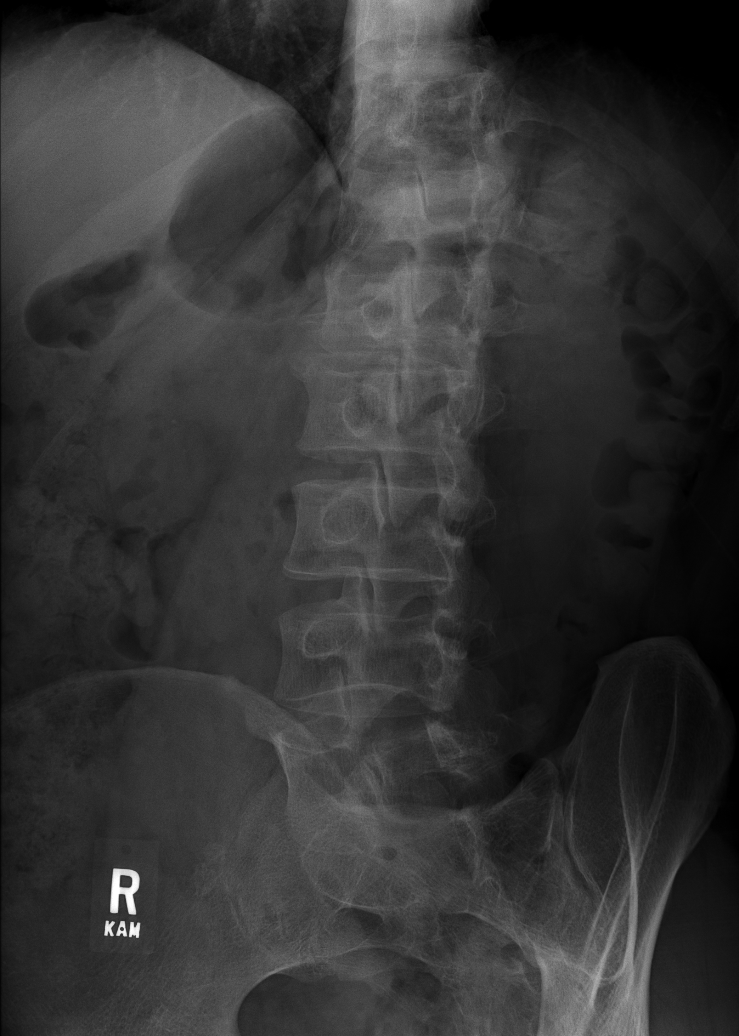

[t lumbar spine obl (2 of 2)]
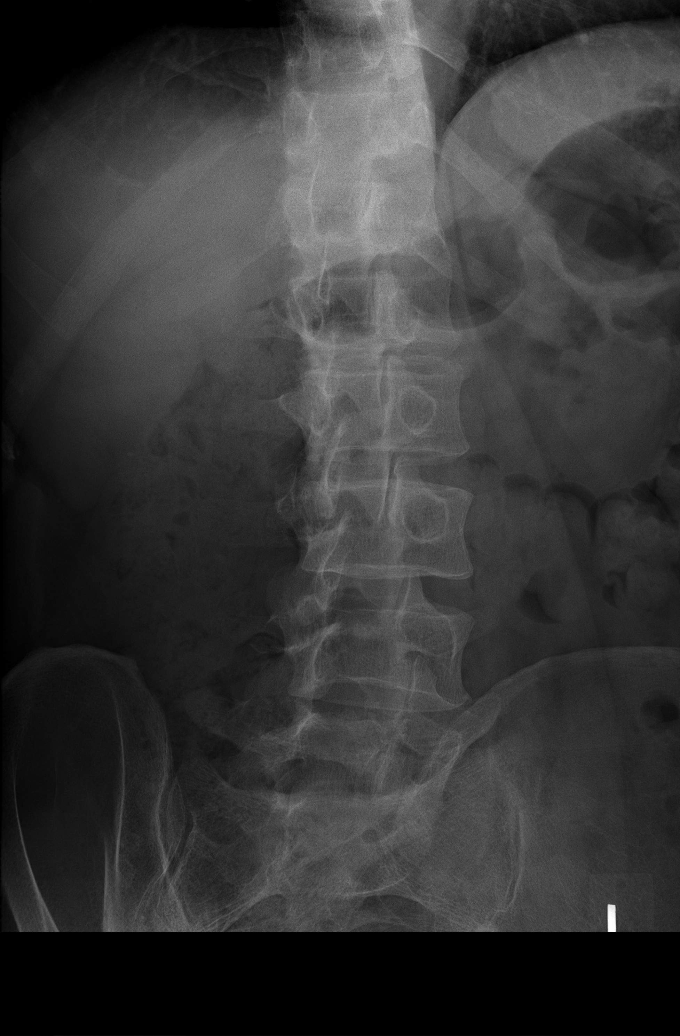

[t lumbar spine lat]
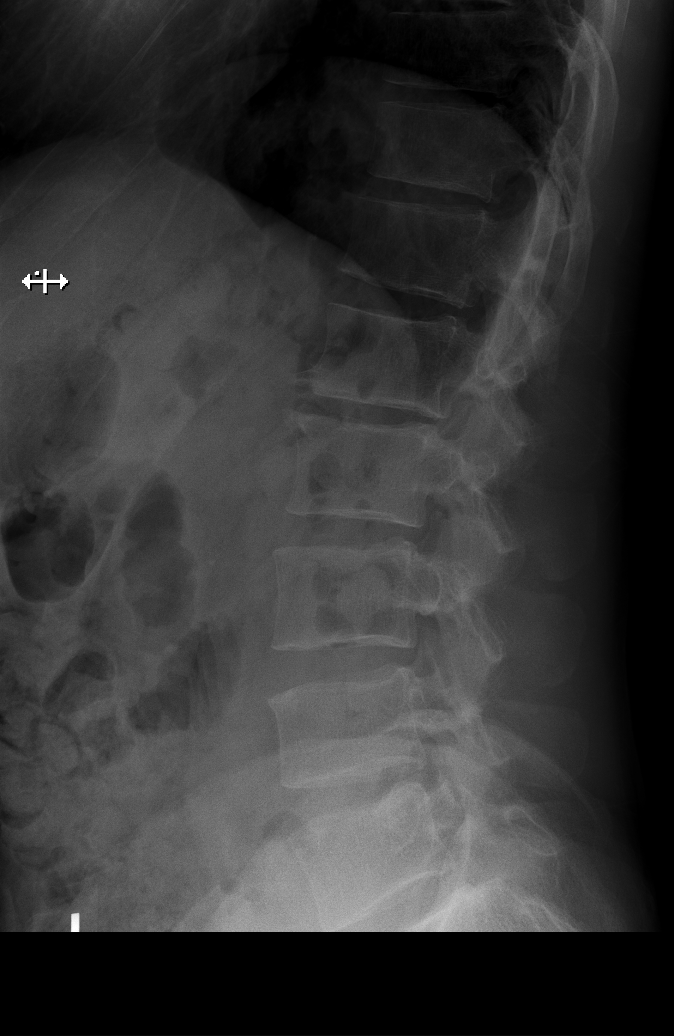

[t lumbar l-5 s-1 spot]
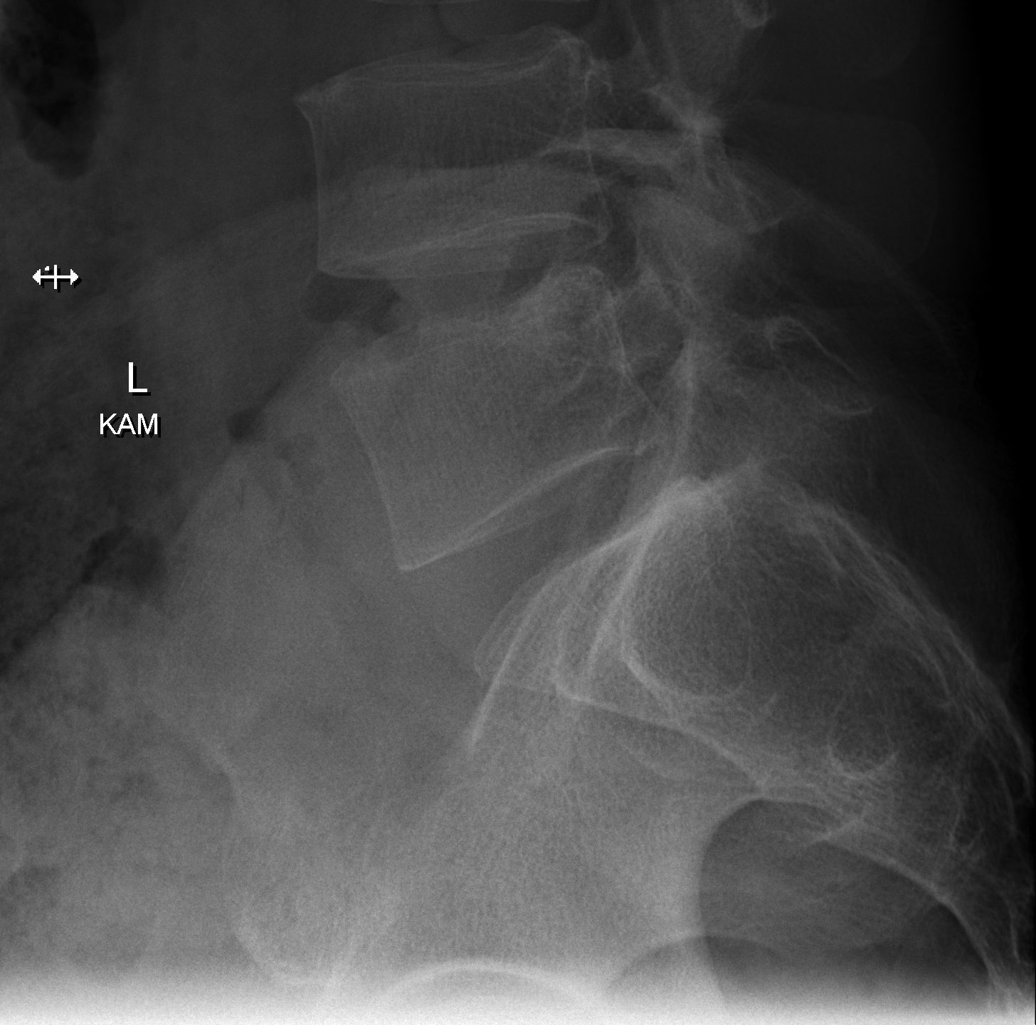

[5 of 5 positions shown; findings below may reference images not displayed]

FINDINGS: Early disc space narrowing and spurring at L1-2. Normal alignment.
No fracture. SI joints symmetric and unremarkable.
IMPRESSION: Mild degenerative disc disease changes at L1-2. No acute bony
abnormality.

## 2021-04-06 ENCOUNTER — Ambulatory Visit: Payer: 59 | Attending: Physician Assistant | Admitting: Physical Therapy

## 2021-04-06 ENCOUNTER — Other Ambulatory Visit: Payer: Self-pay

## 2021-04-06 DIAGNOSIS — R293 Abnormal posture: Secondary | ICD-10-CM | POA: Insufficient documentation

## 2021-04-06 DIAGNOSIS — M533 Sacrococcygeal disorders, not elsewhere classified: Secondary | ICD-10-CM | POA: Insufficient documentation

## 2021-04-06 DIAGNOSIS — G8929 Other chronic pain: Secondary | ICD-10-CM | POA: Diagnosis present

## 2021-04-06 DIAGNOSIS — M4125 Other idiopathic scoliosis, thoracolumbar region: Secondary | ICD-10-CM | POA: Insufficient documentation

## 2021-04-06 DIAGNOSIS — R102 Pelvic and perineal pain: Secondary | ICD-10-CM | POA: Diagnosis present

## 2021-04-06 DIAGNOSIS — R350 Frequency of micturition: Secondary | ICD-10-CM | POA: Insufficient documentation

## 2021-04-06 DIAGNOSIS — M5442 Lumbago with sciatica, left side: Secondary | ICD-10-CM | POA: Insufficient documentation

## 2021-04-06 DIAGNOSIS — R2689 Other abnormalities of gait and mobility: Secondary | ICD-10-CM | POA: Insufficient documentation

## 2021-04-07 NOTE — Therapy (Addendum)
[place] MAIN [facility] SERVICES [address] [place], [place], [address] Phone: [phone]   Fax:  [phone]  Physical Therapy Treatment  Patient Details  Name: Alec Reynolds MRN: [id number] Date of Birth: 04-Jun-1971 No data recorded  Encounter Date: 04/06/2021   PT End of Session - 04/07/21 1035     Visit Number 39    Date for PT Re-Evaluation 06/16/21   PN 05/09/20, 07/21/20, 12/29/20   Authorization Type 30 visits of PT./OT/ chiro per year (16/30)    PT Start Time 1300    PT Stop Time 1400    PT Time Calculation (min) 60 min    Activity Tolerance Patient tolerated treatment well;No increased pain    Behavior During Therapy [facility] for tasks assessed/performed             No past medical history on file.  Past Surgical History:  Procedure Laterality Date   ELBOW SURGERY Left    WRIST SURGERY      There were no vitals filed for this visit.   Subjective Assessment - 04/06/21 1506     Subjective Pt reported "Tried to self wean himself off 56m Diazepam [place]. Caused constipation, pain levels when up and urge and frequency to urinate went up. [place]. [facility].  [facility]. I have sent a message to get back on the books.    As you can probably tell, I've fallen off the optimistic boat.   "  Pt quit his PT exercises because things were getting worse and mentally he got in a bad place. Pt has reached back out to his counselor and will try to get back her schedule. Since using a ball to massage the pain, it has been a tough. The pain from glut along midthigh 8/10 inconsistently. When he is in side lunge position and has to reach for tennis ball, it hurts. Frequency and urgency is "as bad as it every been." Pt  is stressed out about [place] of him being able to physically handle going on plane for 6-8 hours, going on train, going away for a week .  His symptoms started [place]    Pertinent History R groin injury 2017R ,  anxiety, stress Fx in R shin, low back as kid. 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 Pt also enjoyed cycling and stopped when the pain started. Pt used to cycle 2-3 hours 200 miles / day. Pt did not have a stretch routine. Pt completed Pelvic PT via telehealth one year and tried stretching but it made it worse.    Patient Stated Goals sit down for 2 hours  without hurting, Not  have to feel like he has to pee all the time                            Pelvic Floor Special Questions - 04/07/21 1211     External Palpation clothing doffed: sensation bilateral and sharp vs dull over supra pubic/ pelvic area in tact,  tightness  at anterior mm L > R               [facility] Adult PT Treatment/Exercise - 04/07/21 1209       Therapeutic Activites    Other Therapeutic Activities active listening, provided encouragement,      Manual Therapy   Manual therapy comments guided pt on relaxation to minimize pelvic floor tensions                          PT Long Term Goals - 12/30/20 1143       PT LONG TERM GOAL #1   Title Pt will demo decreased abdominal separation from 4 fingers width  to < 1 fingers width to improve IAP system for pelvic floor function    Time 4    Period Weeks    Status Achieved      PT LONG TERM GOAL #2   Title Pt will demo improved posture with more anterior tilt of pelvis, increased lumbar lordosis, and no cues for proper sitting/ standing posture  in order to minimize CLBP and return to more functional activities    Time 6    Period Weeks    Status Achieved      PT LONG TERM  GOAL #3   Title Pt will demo no spinal deviation and more levelled pelvic girdle across 2 visits in order to advance towards deep core/ thoracolumbar strengthening exercises    Time 2    Period Weeks    Status Achieved      PT LONG TERM GOAL #4   Title Pt will demo decreased R pelvic floor mm tightness across 2 visits in order to minimize pelvic pain and straining with bowel movements and optimize IAP system properly    Time 8    Period Weeks    Status Achieved      PT LONG TERM GOAL #5   Title Pt's FOTO score for Pain to decrease from 29pts to < 24pts and Urinary from 38 pts to < 30 pts and Bowel from 17 pts to < 12 pts and  Bowel Leakage improve 59 pts to > 64 pts    Baseline 11/8:Urinary  38 pts to 33 pts, PFDI Bowel 17pts to 4pts, bowel Leakage 59pts to 72 pts   07/21/20:    Time 10    Period Weeks    Status Partially Met      Additional Long Term Goals   Additional Long Term Goals Yes      PT LONG TERM GOAL #6   Title Pt will report no urge to urinate when sitting or bending forward to put on his back pack  in order to attend meetings or leave the house    Time 8    Period Weeks    Status On-going      PT LONG TERM GOAL #7   Title Pt 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    Time 10    Period Weeks    Status Achieved      PT LONG TERM GOAL  #10   TITLE Pt will be able to hike for 72mles on low grade trail and/or 2 miles on a trail with inclines and report no pain .    Time 6    Period Months    Status New    Target Date 06/16/21                    Plan - 04/07/21 1047     Clinical Impression Statement Pt returned to PT after 2 month hiatus. Pt reported relapse of Sx since he tried using tennis ball over area of pelvic pain back in August. Pt reported feeling depressed and stopped doing PT HEP, did not see his counselor , and had tried to wean himself off Diazepem completely despite recommendation by PT to work with his MD on medication changes and this caused  constipation, pain levels, urge and frequency to urinate to increase. Pt has since reached out to his MD and counselor for appts re: medication changes and his depression. Today,  provided pt active listening, encouragement, and reinforcement for return to pelvic floor HEP. Pt demo'd increased anterior pelvic floor mm tensions which decrease when pt was asked to practice relaxation. No treatment was provided to decrease pelvic floor tensions further. Education was emphasized to empower pt on recovery from relapse and to build back up his POC. Explained his tendency to overdo and apply aggressive approach to treating his pelvic pain has been a pattern to his relapse and encouraged more self-compassion instead of finding external gadgets to alleviate his pelvic pain. Plan to review his HEP and help pt return to his HEP to build compliance again. Communicated in a brief email to his counselor where pt is at currently and his plan to work with her.  Pt continues to benefit from an interdisciplinary approach and skilled PT.     Examination-Activity Limitations Toileting;Sit    Stability/Clinical Decision Making Evolving/Moderate complexity    Rehab Potential Good    PT Frequency Monthy    PT Duration Other (comment)   6   PT Treatment/Interventions Neuromuscular re-education;Patient/family education;Therapeutic exercise;Scar mobilization;Taping;Manual techniques;Moist Heat;Therapeutic activities;Gait training;Balance training    Consulted and Agree with Plan of Care Patient              Patient will benefit from skilled therapeutic intervention in order to improve the following deficits and impairments:  Abnormal gait, Decreased coordination, Decreased range of motion, Difficulty walking, Decreased endurance, Decreased safety awareness, Increased muscle spasms, Decreased balance, Decreased strength, Decreased mobility, Postural dysfunction, Improper body mechanics, Decreased scar mobility, Decreased activity tolerance, Increased fascial restricitons, Hypomobility  Visit Diagnosis: Other abnormalities of gait and mobility  Pelvic pain  Urinary frequency  Sacrococcygeal disorders, not elsewhere classified  Abnormal posture  Chronic bilateral low back pain with left-sided sciatica  Other idiopathic scoliosis, thoracolumbar region     Problem List Patient Active Problem List   Diagnosis Date Noted   Excessive sleepiness 01/30/2021   Urinary frequency 12/23/2019   Pelvic pain 12/23/2019   Low back pain 12/22/2019    YJerl Mina PT 04/07/2021, 12:12 PM  [address] [address]Phone: [phone]  Fax:  [phone] Name:  Alec Reynolds MRN: [id number] Date of Birth: 1970/08/21

## 2021-04-13 ENCOUNTER — Ambulatory Visit: Payer: 59 | Admitting: Physical Therapy

## 2021-04-20 ENCOUNTER — Other Ambulatory Visit: Payer: Self-pay

## 2021-04-20 ENCOUNTER — Ambulatory Visit: Payer: 59 | Admitting: Physical Therapy

## 2021-04-20 DIAGNOSIS — R2689 Other abnormalities of gait and mobility: Secondary | ICD-10-CM | POA: Diagnosis not present

## 2021-04-20 DIAGNOSIS — R102 Pelvic and perineal pain: Secondary | ICD-10-CM

## 2021-04-20 DIAGNOSIS — M4125 Other idiopathic scoliosis, thoracolumbar region: Secondary | ICD-10-CM

## 2021-04-20 DIAGNOSIS — M533 Sacrococcygeal disorders, not elsewhere classified: Secondary | ICD-10-CM

## 2021-04-20 DIAGNOSIS — R293 Abnormal posture: Secondary | ICD-10-CM

## 2021-04-20 DIAGNOSIS — R350 Frequency of micturition: Secondary | ICD-10-CM

## 2021-04-20 DIAGNOSIS — G8929 Other chronic pain: Secondary | ICD-10-CM

## 2021-04-21 NOTE — Therapy (Addendum)
[place] MAIN [facility] SERVICES [address] [place], [place], [address] Phone: [phone]   Fax:  [phone]  Physical Therapy Treatment Progress Note from 12/19/20 to 04/20/21   Patient Details  Name: Alec Reynolds MRN: [id number] Date of Birth: 1971/01/16 No data recorded  Encounter Date: 04/20/2021   PT End of Session - 04/21/21 0957     Visit Number 40    Date for PT Re-Evaluation 06/16/21   PN 05/09/20, 07/21/20, 12/29/20, 04/20/21   Authorization Type 30 visits of PT./OT/ chiro per year (17/30)    PT Start Time 1300    PT Stop Time 1400    PT Time Calculation (min) 60 min    Activity Tolerance Patient tolerated treatment well;No increased pain    Behavior During Therapy [facility] for tasks assessed/performed             No past medical history on file.  Past Surgical History:  Procedure Laterality Date   ELBOW SURGERY Left    WRIST SURGERY      There were no vitals filed for this visit.   Subjective Assessment - 04/21/21 1000     Subjective Pt emailed to therapist his updates on 04/19/21:   "  When sit down on toilet and put feet up on squatty potty anus and coccyx is very tight and painful. Also,anal tissues pushing out.      Getting  nightly erections. Pain after these is getting worse. Pain in in following areas:   1.New pain in urethra in outer penis.   1. Left Buttock pain flaring up.   2. When gently touching urethra under scrotum agitates all usual painful areas. Can can feel adhesions. Feels like they are running over urethra or a long the left side of urethra.    3. When sitting or moving on tennis court pain were inside of buttock meets groin.    4. When have bowl movement pain in left side of anus. In am anus very painful after Bm. Most of the day feel like gotta take bm but when try to, anus is tight and locked shut. When can do small bms in early afternoon, often have relief from pain but brings on frequent  urination.     Not knowing EXACTLY is wrong with me is driving me crazy. Willing to do more MRI or ultrasounds to see what is really damaged.  "   Pt also reported he works 7 days of the week. Has not made time for relaxation lately.He wants to go hiking but he has not made time to do that.     Pertinent History R groin injury 2017R ,  anxiety, stress Fx in R shin, low back as kid. 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 Pt also enjoyed cycling and stopped when the pain started. Pt used to cycle 2-3 hours 200 miles / day. Pt did not have a stretch routine. Pt completed Pelvic PT via telehealth one year and tried stretching but it made it worse.    Patient Stated Goals sit down for 2 hours  without hurting, Not  have to feel like he has to pee all the time                Assessment:  Palpation at L pelvic floor , noted tensions more than R at ischial rami and pubic rami  After practicing breathing  technique: less tensions noted and pt reported noticing less mm tensions               [facility] Adult PT Treatment/Exercise - 04/21/21 1001       Therapeutic Activites    Other Therapeutic Activities active listening, provided encouragement, strategy to help recover from relapse, utilize medical providers,      Neuro Re-ed    Neuro Re-ed Details  cued for relaxation practices, positive self talk, breathing practice and therapist applied palpation at pelvic floor mm before/ after technique and pt reported noticing mm more relaxed. ,                          PT Long Term Goals - 04/21/21 1003       PT LONG TERM GOAL #1   Title Pt will demo decreased abdominal separation from 4 fingers width  to < 1 fingers width to improve IAP system for pelvic floor function    Time 4    Period Weeks    Status Achieved      PT LONG TERM GOAL #2   Title Pt will demo improved posture with more anterior tilt of  pelvis, increased lumbar lordosis, and no cues for proper sitting/ standing posture  in order to minimize CLBP and return to more functional activities    Time 6    Period Weeks    Status Achieved      PT LONG TERM GOAL #3   Title Pt will demo no spinal deviation and more levelled pelvic girdle across 2 visits in order to advance towards deep core/ thoracolumbar strengthening exercises    Time 2    Period Weeks    Status Achieved      PT LONG TERM GOAL #4   Title Pt will demo decreased R pelvic floor mm tightness across 2 visits in order to minimize pelvic pain and straining with bowel movements and optimize IAP system properly    Time 8    Period Weeks    Status Achieved      PT LONG TERM GOAL #5   Title Pt's FOTO score for Pain to decrease from 29pts to < 24pts and Urinary from 38 pts to < 30 pts and Bowel from 17 pts to < 12 pts and  Bowel Leakage improve 59 pts to > 64 pts    Baseline 11/8:Urinary  38 pts to 33 pts, PFDI Bowel 17pts to 4pts, bowel Leakage 59pts to 72 pts   07/21/20:    Time 10    Period Weeks    Status Partially Met      PT LONG TERM GOAL #6   Title Pt will report no urge to urinate when sitting or bending forward to put on his back pack  in order to attend meetings or leave the house    Time 8    Period Weeks    Status On-going      PT LONG TERM GOAL #7   Title Pt 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    Time 10    Period Weeks    Status Achieved      PT LONG TERM GOAL  #10   TITLE Pt will be able to hike for 29mles on low grade  trail and/or 2 miles on a trail with inclines and report no pain .    Time 6    Period Months    Status On-going                   Plan - 04/21/21 0957     Clinical Impression Statement Pt has achieved 5/10 goals and progressing well towards remaining goals. Pt had another relapse after applying tennis ball at pelvic pain area back in late August. Pt did not have PT in Sept due to schduling issues and he also did not maintain counseling sessions. Pt also tried to wean himself off Diazepem despite advise to follow up with MD to guide him with medication changes. All these incidents caused triggers of his urgency, constipation, and pain with sitting. Therapist has communicated to counselor and referring therapist re: pt's relapse.   Today, pt was provided active listening and was explained his last relapse occurred when he applied too much effort with exercises that the acupuncturist prescribed and pt had not notified therapist about these exercises which were contraindicated to his pelvic pain relief program.  Pt was provided encouragement about his ability to overcome relapse and to return to past HEP as it had helped him through the past relapse. Advised pt to continue getting support from his providers and to maintain his relaxation practices as pt reported he works 7 days of the week. Pt reports feelings of shame and feelings that he had do it upon himself for this pain and has had adversity towards his genitalia. Pt continues to have noctural erections but it causes pain.   Recommended pt to add sex therapist psychotherapist in addition to his current therapist. Pt was provided the clinic name and info.   Pt demo'd increased pelvic floor mm tightness L > R and after new breathing technique for relaxation, pt demo'd decreased tightness and reported noticing less pain.    Pt continues to benefit from skilled PT. More downregulation of nn system with biopsychosocial approaches will help pt  overcome this relapse and continue to manage his pain IND.      Examination-Activity Limitations Toileting;Sit    Stability/Clinical Decision Making Evolving/Moderate complexity    Rehab Potential Good    PT Frequency Monthy    PT Duration Other (comment)   6   PT Treatment/Interventions Neuromuscular re-education;Patient/family education;Therapeutic exercise;Scar mobilization;Taping;Manual techniques;Moist Heat;Therapeutic activities;Gait training;Balance training    Consulted and Agree with Plan of Care Patient             Patient will benefit from skilled therapeutic intervention in order to improve the following deficits and impairments:  Abnormal gait, Decreased coordination, Decreased range of motion, Difficulty walking, Decreased endurance, Decreased safety awareness, Increased muscle spasms, Decreased balance, Decreased strength, Decreased mobility, Postural dysfunction, Improper body mechanics, Decreased scar mobility, Decreased activity tolerance, Increased fascial restricitons, Hypomobility  Visit Diagnosis: Pelvic pain  Other abnormalities of gait and mobility  Urinary frequency  Sacrococcygeal disorders, not elsewhere classified  Chronic bilateral low back pain with left-sided  sciatica  Abnormal posture  Other idiopathic scoliosis, thoracolumbar region     Problem List Patient Active Problem List   Diagnosis Date Noted   Excessive sleepiness 01/30/2021   Urinary frequency 12/23/2019   Pelvic pain 12/23/2019   Low back pain 12/22/2019    Jerl Mina, PT 04/21/2021, 10:05 AM  [place] MAIN [facility] SERVICES [address] [place], [place], [address] Phone: [phone]   Fax:  [phone]  Name: Alec Reynolds MRN: [id number] Date of Birth: December 28, 1970

## 2021-05-04 ENCOUNTER — Other Ambulatory Visit: Payer: Self-pay

## 2021-05-04 ENCOUNTER — Ambulatory Visit: Payer: 59 | Attending: Physician Assistant | Admitting: Physical Therapy

## 2021-05-04 DIAGNOSIS — M533 Sacrococcygeal disorders, not elsewhere classified: Secondary | ICD-10-CM | POA: Diagnosis not present

## 2021-05-04 NOTE — Patient Instructions (Signed)
Referral to sex therapist, continue to speak to your psychotherapist about your incident in 2019  Focus on the strengths and foundation to  help your through this relapse : - maintain medical appts and know that your team supports  -objectivity practice: more time in [place] because you expressed how much it calms you when lying on grass and cloudgazing . "I realized how calm I was even without trying. " Please document in your gratitude calender when you made time to be outside and what you noticed in [place] and in your body and mind  -self-compassion and curiosity   __  PT will communicate with urologist about painful nocturnal   __ Perform some of your pelvic stretches in your yard on picnic blanket and then a cloud gaze practice for calm

## 2021-05-05 NOTE — Therapy (Signed)
[place] MAIN [facility] SERVICES [address] [place], [place], [address] Phone: [phone]   Fax:  [phone]  Physical Therapy Treatment  Patient Details  Name: Alec Reynolds MRN: [id number] Date of Birth: 1970/11/19 No data recorded  Encounter Date: 05/04/2021   PT End of Session - 05/04/21 1310     Visit Number 41    Date for PT Re-Evaluation 06/16/21   PN 05/09/20, 07/21/20, 12/29/20, 04/20/21   Authorization Type 30 visits of PT./OT/ chiro per year (18/30)    PT Start Time 1306    PT Stop Time 1406    PT Time Calculation (min) 60 min    Activity Tolerance Patient tolerated treatment well;No increased pain    Behavior During Therapy [facility] for tasks assessed/performed             No past medical history on file.  Past Surgical History:  Procedure Laterality Date   ELBOW SURGERY Left    WRIST SURGERY      There were no vitals filed for this visit.   Subjective Assessment - 05/04/21 1321     Subjective Patient started back on acupuncture for 2 sessions since the relapse of pain (that started in August) .  He does not do the exercises that acupuncturist prescribes and and he finds the acupuncture to be helpful to relax.  Patient notices he is getting more more nocturnal erections which to him is a sign that he is getting healthier even though it causes more pelvic pain and urge to urinate.  He is sleeping throughout the night  7-8 hours but gets up with the urge to pee due to nocturnal erections 3-4 x night compared to prior to relapse of pain 1-2 x night. Pt continues to use Diazapem anal suppositories for pain relief which is helping with sleep.  In the morning after going to pee at 5:30am and try lay and then getting back up to stand, the urge will come back on even though he peed one hour ago. Pt did go on one walk last week with teh dog and noticed Petra Kuba. Pt took his workstuff outside to do so he could enjoy [place]. "I find it  is the most relaxing thing to do when lying on the grass and watching the clouds."  "There was a moment when I noticed I was calm without trying to be calm"  "I was always outside. The more amazing the [place] is, the more I feel calm".    Pertinent History R groin injury 2017R ,  anxiety, stress Fx in R shin, low back as kid. 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 Pt also enjoyed cycling and stopped when the pain started. Pt used to cycle 2-3 hours 200 miles / day. Pt did not have a stretch routine. Pt completed Pelvic PT via telehealth one year and tried stretching but it made it worse.    Patient Stated Goals sit down for 2 hours  without hurting, Not  have to feel like he has to pee all the time                [facility] PT Assessment - 05/05/21 1243       Observation/Other Assessments   Observations leaning in chair, slightly slumped posture  [place] Adult PT Treatment/Exercise - 05/05/21 1242       Therapeutic Activites    Other Therapeutic Activities active listening, provided encouragement and encouraged relaxation because pt works everyday, explained anatomy/ physiology to erections/ ejacluation and nervous system                          PT Long Term Goals - 04/21/21 1003       PT LONG TERM GOAL #1   Title Pt will demo decreased abdominal separation from 4 fingers width  to < 1 fingers width to improve IAP system for pelvic floor function    Time 4    Period Weeks    Status Achieved      PT LONG TERM GOAL #2   Title Pt will demo improved posture with more anterior tilt of pelvis, increased lumbar lordosis, and no cues for proper sitting/ standing posture  in order to minimize CLBP and return to more functional activities    Time 6    Period Weeks    Status Achieved      PT LONG TERM GOAL #3   Title Pt will demo no spinal deviation and more  levelled pelvic girdle across 2 visits in order to advance towards deep core/ thoracolumbar strengthening exercises    Time 2    Period Weeks    Status Achieved      PT LONG TERM GOAL #4   Title Pt will demo decreased R pelvic floor mm tightness across 2 visits in order to minimize pelvic pain and straining with bowel movements and optimize IAP system properly    Time 8    Period Weeks    Status Achieved      PT LONG TERM GOAL #5   Title Pt's FOTO score for Pain to decrease from 29pts to < 24pts and Urinary from 38 pts to < 30 pts and Bowel from 17 pts to < 12 pts and  Bowel Leakage improve 59 pts to > 64 pts    Baseline 11/8:Urinary  38 pts to 33 pts, PFDI Bowel 17pts to 4pts, bowel Leakage 59pts to 72 pts   07/21/20:    Time 10    Period Weeks    Status Partially Met      PT LONG TERM GOAL #6   Title Pt will report no urge to urinate when sitting or bending forward to put on his back pack  in order to attend meetings or leave the house    Time 8    Period Weeks    Status On-going      PT LONG TERM GOAL #7   Title 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    Time 10    Period Weeks    Status Achieved      PT LONG TERM  GOAL  #10   TITLE Pt will be able to hike for 59mles on low grade trail and/or 2 miles on a trail with inclines and report no pain .    Time 6    Period Months    Status On-going                   Plan - 05/04/21 1311     Clinical Impression Statement Provided biopsychosocial approaches today. Provided encouragement and  encouraged relaxation because pt works everyday, explained anatomy/ physiology to erections/ ejacluation and nervous system. Pt agreed to work with sex therapist because his nocturnal erections are causing more urge and frequency and pt has a past trauma. Pt plans to discuss with his regular therapist next week even though he has not talked to about it before. PT plans to communicate with urologist re: his noctural erections issues. Pt continues to benefit from skilled PT.      Examination-Activity Limitations Toileting;Sit    Stability/Clinical Decision Making Evolving/Moderate complexity    Rehab Potential Good    PT Frequency Monthy    PT Duration Other (comment)   6   PT Treatment/Interventions Neuromuscular re-education;Patient/family education;Therapeutic exercise;Scar mobilization;Taping;Manual techniques;Moist Heat;Therapeutic activities;Gait training;Balance training    Consulted and Agree with Plan of Care Patient             Patient will benefit from skilled therapeutic intervention in order to improve the following deficits and impairments:  Abnormal gait, Decreased coordination, Decreased range of motion, Difficulty walking, Decreased endurance, Decreased safety awareness, Increased muscle spasms, Decreased balance, Decreased strength, Decreased mobility, Postural dysfunction, Improper body mechanics, Decreased scar mobility, Decreased activity tolerance, Increased fascial restricitons, Hypomobility  Visit Diagnosis: Sacrococcygeal disorders, not elsewhere classified     Problem List Patient Active Problem List   Diagnosis Date Noted   Excessive sleepiness 01/30/2021   Urinary frequency 12/23/2019   Pelvic pain 12/23/2019   Low back pain 12/22/2019    YJerl Mina PT 05/05/2021, 12:44 PM  [place] [facility] [address] [address]Phone: [phone]  Fax:  [phone] Name: TNaomi FittonMRN: [id number] of Birth: 809/08/1970

## 2021-05-18 ENCOUNTER — Encounter: Payer: 59 | Admitting: Physical Therapy

## 2021-06-01 ENCOUNTER — Other Ambulatory Visit: Payer: Self-pay

## 2021-06-01 ENCOUNTER — Ambulatory Visit: Payer: 59 | Attending: Physician Assistant | Admitting: Physical Therapy

## 2021-06-01 DIAGNOSIS — R350 Frequency of micturition: Secondary | ICD-10-CM | POA: Insufficient documentation

## 2021-06-01 DIAGNOSIS — M5442 Lumbago with sciatica, left side: Secondary | ICD-10-CM | POA: Insufficient documentation

## 2021-06-01 DIAGNOSIS — R2689 Other abnormalities of gait and mobility: Secondary | ICD-10-CM | POA: Diagnosis present

## 2021-06-01 DIAGNOSIS — M4125 Other idiopathic scoliosis, thoracolumbar region: Secondary | ICD-10-CM | POA: Diagnosis present

## 2021-06-01 DIAGNOSIS — R293 Abnormal posture: Secondary | ICD-10-CM | POA: Insufficient documentation

## 2021-06-01 DIAGNOSIS — G8929 Other chronic pain: Secondary | ICD-10-CM | POA: Diagnosis present

## 2021-06-01 DIAGNOSIS — M533 Sacrococcygeal disorders, not elsewhere classified: Secondary | ICD-10-CM | POA: Diagnosis present

## 2021-06-01 DIAGNOSIS — R102 Pelvic and perineal pain: Secondary | ICD-10-CM | POA: Diagnosis present

## 2021-06-01 NOTE — Therapy (Signed)
[place] MAIN [facility] SERVICES [address] [place], [place], [address] Phone: [phone]   Fax:  [phone]  Physical Therapy Treatment  Patient Details  Name: Alec Reynolds MRN: [id number] Date of Birth: April 29, 1971 No data recorded  Encounter Date: 06/01/2021   PT End of Session - 06/01/21 1328     Visit Number 42    Date for PT Re-Evaluation 06/16/21   PN 05/09/20, 07/21/20, 12/29/20, 04/20/21   Authorization Type 30 visits of PT./OT/ chiro per year (19/30)    PT Start Time 1300    PT Stop Time 1400    PT Time Calculation (min) 60 min    Activity Tolerance Patient tolerated treatment well;No increased pain    Behavior During Therapy [facility] for tasks assessed/performed             No past medical history on file.  Past Surgical History:  Procedure Laterality Date   ELBOW SURGERY Left    WRIST SURGERY      There were no vitals filed for this visit.   Subjective Assessment - 06/01/21 1307     Subjective Pt did not work for 4 days and noticed his Sx were better. Pt tried low cobra pose and noticed improved urgency Sx. Rolling on a ball with sand in it over psoas helped but pain in the area and every now and then, there is an electric bolt that shoots down. It helped with urgency.   Upper low back feels super    Pertinent History R groin injury 2017R ,  anxiety, stress Fx in R shin, low back as kid. 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 Pt also enjoyed cycling and stopped when the pain started. Pt used to cycle 2-3 hours 200 miles / day. Pt did not have a stretch routine. Pt completed Pelvic PT via telehealth one year and tried stretching but it made it worse.    Patient Stated Goals sit down for 2 hours  without hurting, Not  have to feel like he has to pee all the time                [facility] PT Assessment - 06/01/21 1651       Observation/Other  Assessments   Observations tension with stretches , excessive l umbar lordosis in high cobra                           [facility] Adult PT Treatment/Exercise - 06/01/21 1649       Therapeutic Activites    Other Therapeutic Activities explained principles of stretching with relaxation, principles of downgrading range of motion in yoga pose to minimize LBP, Provided customized yoga poses to promote more scapular / retraction and less lumbar lordosis while still promoting hip flexor release      Neuro Re-ed    Neuro Re-ed Details  cued for yoga poses to help minimize increased lumabr lordosis, alignment and technique, principle of relaxation                          PT Long Term Goals - 04/21/21 1003       PT LONG TERM GOAL #1   Title Pt will demo decreased abdominal separation from 4 fingers width  to < 1 fingers width to improve IAP system for pelvic floor  function    Time 4    Period Weeks    Status Achieved      PT LONG TERM GOAL #2   Title Pt will demo improved posture with more anterior tilt of pelvis, increased lumbar lordosis, and no cues for proper sitting/ standing posture  in order to minimize CLBP and return to more functional activities    Time 6    Period Weeks    Status Achieved      PT LONG TERM GOAL #3   Title Pt will demo no spinal deviation and more levelled pelvic girdle across 2 visits in order to advance towards deep core/ thoracolumbar strengthening exercises    Time 2    Period Weeks    Status Achieved      PT LONG TERM GOAL #4   Title Pt will demo decreased R pelvic floor mm tightness across 2 visits in order to minimize pelvic pain and straining with bowel movements and optimize IAP system properly    Time 8    Period Weeks    Status Achieved      PT LONG TERM GOAL #5   Title Pt's FOTO score for Pain to decrease from 29pts to < 24pts and Urinary from 38 pts to < 30 pts and Bowel from 17 pts to < 12 pts and  Bowel Leakage  improve 59 pts to > 64 pts    Baseline 11/8:Urinary  38 pts to 33 pts, PFDI Bowel 17pts to 4pts, bowel Leakage 59pts to 72 pts   07/21/20:    Time 10    Period Weeks    Status Partially Met      PT LONG TERM GOAL #6   Title Pt will report no urge to urinate when sitting or bending forward to put on his back pack  in order to attend meetings or leave the house    Time 8    Period Weeks    Status On-going      PT LONG TERM GOAL #7   Title 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    Time 10    Period Weeks    Status Achieved      PT LONG TERM GOAL  #10   TITLE Pt will be able to hike for 24mles on low grade trail and/or 2 miles on a trail with inclines and report no pain .    Time 6    Period Months    Status On-going                   Plan - 06/01/21 1651     Clinical Impression Statement Pt has found yoga poses to he helpful with decreasing urgency. However, pt required cues for not overstretching, stretching with less tensions in low back and maintaining breathing technique. Explained principles of stretching with relaxation,  principles of downgrading range of motion in yoga pose to minimize LBP.  Provided customized yoga poses to promote more scapular / retraction and less lumbar lordosis while still promoting hip flexor release. Pt continues to benefit from skilled PT.                                         Examination-Activity Limitations Toileting;Sit    Stability/Clinical Decision Making Evolving/Moderate complexity    Rehab Potential Good     PT Frequency Monthy    PT Duration Other (comment)   6   PT Treatment/Interventions Neuromuscular re-education;Patient/family education;Therapeutic exercise;Scar mobilization;Taping;Manual techniques;Moist Heat;Therapeutic activities;Gait training;Balance training    Consulted and Agree with Plan of Care Patient             Patient will benefit from skilled therapeutic intervention in order to improve the following deficits and impairments:  Abnormal gait, Decreased coordination, Decreased range of motion, Difficulty walking, Decreased endurance, Decreased safety awareness, Increased muscle spasms, Decreased balance, Decreased strength, Decreased mobility, Postural dysfunction, Improper body mechanics, Decreased scar mobility, Decreased activity tolerance, Increased fascial restricitons, Hypomobility  Visit Diagnosis: Sacrococcygeal disorders, not elsewhere classified  Pelvic pain  Other abnormalities of gait and mobility  Urinary frequency  Chronic bilateral low back pain with left-sided sciatica  Abnormal posture  Other idiopathic scoliosis, thoracolumbar region     Problem List Patient Active Problem List   Diagnosis Date Noted   Excessive sleepiness 01/30/2021   Urinary frequency 12/23/2019   Pelvic pain 12/23/2019   Low back pain 12/22/2019    Jerl Mina, PT 06/01/2021, 4:52 PM  [place] MAIN [facility] SERVICES [address] [place], [place], [address] Phone: [phone]   Fax:  [phone]  Name: Alec Reynolds MRN: [id number] Date of Birth: 05-05-1971

## 2021-06-01 NOTE — Patient Instructions (Addendum)
To minimize upper low back pain: while doing ab/ hip flexor stretches   Instead of 10 high cobras  _3 locust/ cricket wings  - arch in the midback  _3 sphinx , forearm on floor, elbow under shoulder  _Grade ankle for quad stretch while still on opp forearm -> look over shoulder to the foot  _3 high cobras, keep gaze down t, chin tuck     _Child poses rocking _Hands to R, hands to L to stretch sides   _Table top position _Thread the needle ( modified , no need to place head down  Both sides    On back: -happy baby, hold thighs in - happy baby position, straighten one knee out at a time as you rock towards the moving leg side    _Side of hip stretch:  Reclined twist for hips and side of the hips/ legs  Lay on your back, knees bend Scoot hips to the R , leave shoulders in place Drop knees to the L side resting onto pillows to keep leg at the same width of hips Pillow under L thigh to minimize too much strain    -corpse pose Svasana  (5-10 min)   _____ DVD yoga link

## 2021-06-08 ENCOUNTER — Ambulatory Visit: Payer: 59 | Admitting: Physical Therapy

## 2021-06-12 NOTE — Progress Notes (Signed)
06/14/21 12:37 PM   Alec Reynolds 1971/05/28 [id number]  Referring provider:  Ileana Ladd, MD [address] [place],  [place] [address] Chief Complaint  Patient presents with   Pelvic floor dysfunction     HPI: Alec Reynolds is a 50 y.o.male with a personal history of chronic pelvic pain, penile mas, and pelvis floor dysfunction, who presents today to evaluate pelvic pain.    In summary, his symptoms began to acutely worsen in 2019 associated with significant worsening urinary symptoms, penile pain, urgency frequency and testicular pain along with perineal pain radiating to his rectum  Please see previous notes for details.  He is under the care of [facility] here at [place].   He reports today that he is still experiencing pelvic pain. He has continued with physical therapy. He reports that his constipation is intermittent and he sometimes experiences diarrhea.  He also has urinary urgency and frequency which is chronic and bothersome to him.  Today, his main concern today is about whether or not there is any additional testing that can be done to prove why this happened or solidify the diagnosis.  He still very concerned about various scar tissue in and around his penis and chronic pain in his perineum in addition to his frequency.   IPSS     Row Name 06/13/21 1000         International Prostate Symptom Score   How often have you had the sensation of not emptying your bladder? Not at All     How often have you had to urinate less than every two hours? Almost always     How often have you found you stopped and started again several times when you urinated? Not at All     How often have you found it difficult to postpone urination? Almost always     How often have you had a weak urinary stream? Less than half the time     How often have you had to strain to start urination? Not at All     How many times did you typically get up at night to urinate?  2 Times     Total IPSS Score 14       Quality of Life due to urinary symptoms   If you were to spend the rest of your life with your urinary condition just the way it is now how would you feel about that? Terrible              PMH: No past medical history on file.  Surgical History: Past Surgical History:  Procedure Laterality Date   ELBOW SURGERY Left    WRIST SURGERY      Home Medications:  Allergies as of 06/13/2021   No Known Allergies      Medication List        Accurate as of June 13, 2021 11:59 PM. If you have any questions, ask your nurse or doctor.          gabapentin 300 MG capsule Commonly known as: NEURONTIN SMARTSIG:1 By Mouth 4-5 Times Daily   Gemtesa 75 MG Tabs Generic drug: Vibegron Take 75 mg by mouth daily. Started by: Vanna Scotland, MD   sertraline 100 MG tablet Commonly known as: ZOLOFT Take 100 mg by mouth as directed.        Allergies: No Known Allergies  Family History: Family History  Problem Relation Age of Onset   Stroke Father 42  Social History:  reports that he has never smoked. He has never used smokeless tobacco. He reports current alcohol use. He reports that he does not use drugs.   Physical Exam: BP 98/60   Pulse 60   Ht 6\' 4"  (1.93 m)   Wt 220 lb (99.8 kg)   BMI 26.78 kg/m   Constitutional:  Alert and oriented, No acute distress. HEENT: [place] AT, moist mucus membranes.  Trachea midline, no masses. Cardiovascular: No clubbing, cyanosis, or edema. Respiratory: Normal respiratory effort, no increased work of breathing. Skin: No rashes, bruises or suspicious lesions. Neurologic: Grossly intact, no focal deficits, moving all 4 extremities. Psychiatric: Normal mood and affect.  Laboratory Data:  Lab Results  Component Value Date   CREATININE 1.00 12/14/2016   Pertinent Imaging: Results for orders placed or performed in visit on 06/13/21  Microscopic Examination   Urine  Result Value Ref Range    WBC, UA 0-5 0 - 5 /hpf   RBC 0-2 0 - 2 /hpf   Epithelial Cells (non renal) 0-10 0 - 10 /hpf   Bacteria, UA None seen None seen/Few  Urinalysis, Complete  Result Value Ref Range   Specific Gravity, UA 1.015 1.005 - 1.030   pH, UA 7.0 5.0 - 7.5   Color, UA Yellow Yellow   Appearance Ur Clear Clear   Leukocytes,UA Negative Negative   Protein,UA Negative Negative/Trace   Glucose, UA Negative Negative   Ketones, UA Negative Negative   RBC, UA Negative Negative   Bilirubin, UA Negative Negative   Urobilinogen, Ur 0.2 0.2 - 1.0 mg/dL   Nitrite, UA Negative Negative   Microscopic Examination See below:   Bladder Scan (Post Void Residual) in office  Result Value Ref Range   Scan Result 90    Assessment & Plan:   Chronic pelvic pain in male  -Under excellent care of our local pelvic floor therapist, strongly recommend continuing his treatments -Reiterated today that chronic pelvic pain syndrome is a constellation of likely various underlying pathology all of which found to this category and is essentially diagnosis of exclusion.  He has had extensive work-up and opinions all of which point towards chronic pelvic pain syndrome.  Validated that his pain is real and he is taking the appropriate steps to manage his day-to-day symptoms. - Discussed with him today that he is on the best route of treatment. We discussed adding medication for his urinary frequency. Strongly recommend he continue his treatments.  2. Urinary frequency  - He was offered medication for urinary frequency. He is interested in this at this times. He was given trial Gemtesa  3. Pelvic floor dysfunction - Suspect urinary symptoms secondary to pelvic floor dysfunction, continue physical therapy    Follow-up as needed   06/15/21 as a scribe for Tawni Millers, MD.,have documented all relevant documentation on the behalf of Vanna Scotland, MD,as directed by  Vanna Scotland, MD while in the presence of Vanna Scotland, MD.  I spent 32 total minutes on the day of the encounter including pre-visit review of the medical record, face-to-face time with the patient, and post visit ordering of labs/imaging/tests.   [facility] [address], [address] [place], [place] [phone]

## 2021-06-13 ENCOUNTER — Ambulatory Visit (INDEPENDENT_AMBULATORY_CARE_PROVIDER_SITE_OTHER): Payer: 59 | Admitting: Urology

## 2021-06-13 ENCOUNTER — Encounter: Payer: Self-pay | Admitting: Urology

## 2021-06-13 ENCOUNTER — Other Ambulatory Visit: Payer: Self-pay

## 2021-06-13 VITALS — BP 98/60 | HR 60 | Ht 76.0 in | Wt 220.0 lb

## 2021-06-13 DIAGNOSIS — M6289 Other specified disorders of muscle: Secondary | ICD-10-CM

## 2021-06-13 DIAGNOSIS — G8929 Other chronic pain: Secondary | ICD-10-CM | POA: Diagnosis not present

## 2021-06-13 DIAGNOSIS — R102 Pelvic and perineal pain: Secondary | ICD-10-CM

## 2021-06-13 LAB — URINALYSIS, COMPLETE
Bilirubin, UA: NEGATIVE
Glucose, UA: NEGATIVE
Ketones, UA: NEGATIVE
Leukocytes,UA: NEGATIVE
Nitrite, UA: NEGATIVE
Protein,UA: NEGATIVE
RBC, UA: NEGATIVE
Specific Gravity, UA: 1.015 (ref 1.005–1.030)
Urobilinogen, Ur: 0.2 mg/dL (ref 0.2–1.0)
pH, UA: 7 (ref 5.0–7.5)

## 2021-06-13 LAB — MICROSCOPIC EXAMINATION: Bacteria, UA: NONE SEEN

## 2021-06-13 LAB — BLADDER SCAN AMB NON-IMAGING: Scan Result: 90

## 2021-06-13 MED ORDER — GEMTESA 75 MG PO TABS: 75.0000 mg | ORAL_TABLET | Freq: Every day | ORAL | 0 refills | Status: DC

## 2021-06-15 ENCOUNTER — Other Ambulatory Visit: Payer: Self-pay

## 2021-06-15 ENCOUNTER — Ambulatory Visit: Payer: 59 | Admitting: Physical Therapy

## 2021-06-15 DIAGNOSIS — R102 Pelvic and perineal pain: Secondary | ICD-10-CM

## 2021-06-15 DIAGNOSIS — G8929 Other chronic pain: Secondary | ICD-10-CM

## 2021-06-15 DIAGNOSIS — M533 Sacrococcygeal disorders, not elsewhere classified: Secondary | ICD-10-CM

## 2021-06-15 DIAGNOSIS — R350 Frequency of micturition: Secondary | ICD-10-CM

## 2021-06-15 DIAGNOSIS — R2689 Other abnormalities of gait and mobility: Secondary | ICD-10-CM

## 2021-06-15 DIAGNOSIS — M5442 Lumbago with sciatica, left side: Secondary | ICD-10-CM

## 2021-06-15 DIAGNOSIS — M4125 Other idiopathic scoliosis, thoracolumbar region: Secondary | ICD-10-CM

## 2021-06-15 DIAGNOSIS — R293 Abnormal posture: Secondary | ICD-10-CM

## 2021-06-15 NOTE — Patient Instructions (Signed)
Stretches : (Cuing provided for proper alignment)  Instructions start with Strap on R    Stretches for your legs: LAYING on Back Use upper arms and elbows for stability when pulling strap Opposite knee bent and foot firm in align with hip   Strap on ballmound:  Hip socket  _strap, L knee bent, R ballmound against strap and spread toes, rolling foot 15 deg out and in across midline.  10 reps each side   Hamstring _knee bends  10 reps  With knee pointing straight ( slightly to outside to minimize snapping sensation)      10 reps with knee pointing out towards armpit ( notice the stretch in the medial hamstring muscle)     IT band  _scoot hips to R, cross R leg over L and straighten knee with strap on ballmound,   Bend knee back and forth 5x    Quad in sidelying _strap around the ankle, pulling ankle towards buttocks  Bottom leg firm and stabilization with knee bent  Adductors and pelvic floor ( Happy Baby) : _knees are wide towards armpits, sole of feet towards ceiling   On your back again:  Strap under R thigh Hip abductors ( figure 4)      ,L ankle over R thigh ( stretching L glut)     5  Breaths   

## 2021-06-16 NOTE — Therapy (Signed)
[place] MAIN [facility] SERVICES [address] [place], [place], [address] Phone: [phone]   Fax:  [phone]  Physical Therapy Treatment  Patient Details  Name: Alec Reynolds MRN: [id number] Date of Birth: [birth year] No data recorded  Encounter Date: 06/15/2021   PT End of Session - 06/16/21 1002     Visit Number 43    Date for PT Re-Evaluation 08/25/21   PN 05/09/20, 07/21/20, 12/29/20, 04/20/21,   Authorization Type 30 visits of PT./OT/ chiro per year (19/30)    PT Start Time 7096    PT Stop Time 1415    PT Time Calculation (min) 70 min    Activity Tolerance Patient tolerated treatment well;No increased pain    Behavior During Therapy [facility] for tasks assessed/performed             No past medical history on file.  Past Surgical History:  Procedure Laterality Date   ELBOW SURGERY Left    WRIST SURGERY      There were no vitals filed for this visit.   Subjective Assessment - 06/15/21 1333     Subjective Pt was not able to accept his Sx in the past but he is able to accept his Sx now. Pt went to see urologist who offered medication to help with urgency.  Going to see her helped him to emotionally experience on a new change  because he learned that was no specific  solution to chronic pelvic pain.  Pt is now seeing Healing Touch practitioner at [facility] as referred by PT. Pt is accepting "This is the new [place]" and practice getting better of dealing with it, mentally ,physically and work with providers. "I will not let it crush my life". "I have learned alot and  to go to counseling." He appreciates his providers. It was so hard intitially to reach out to talk to anybody about it.  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 "I am still under this dark cloud. I am coming to terms that it is ok. I am handling this dark cloud that is always going to be there. I am dealing with it.  I  have an umbrella. "   Pt has found a psoas release technique on line where he lies on a ball. Pt has tried it for 4 weeks , 3 x week. This techniques has helped him to experience less pain, he can  stand up staighter, more relaxed and calm in the pelvic area and also, relax his penis when he has to urinate. But his urgency has increased since doing this technique. Pt is careful now not putting too much pressure with the ball because it had caused shooting pain to the penis when he pressed too hard the ball.  Pt stopped doing PT exercises.    Pertinent History R groin injury 2017R ,  anxiety, stress Fx in R shin, low back as kid. 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 Pt also enjoyed cycling and stopped when the pain started. Pt used to cycle 2-3 hours 200 miles / day. Pt did not have a stretch routine. Pt completed Pelvic PT via telehealth one year and tried stretching but it made it worse.    Patient Stated Goals sit down for 2 hours  without hurting, Not  have to feel like  he has to pee all the time                            Pelvic Floor Special Questions - 06/16/21 1025     External Palpation through clothing, tightness at L pelvic floor anterior, perineum               [facility] Adult PT Treatment/Exercise - 06/16/21 0953       Therapeutic Activites    Other Therapeutic Activities                                                                                                                                                                                                                                                                                     Neuro Re-ed    Neuro Re-ed Details  cued for lower kientic chain stretches with yoga strap      Manual Therapy   Manual therapy comments external assessment                          PT Long Term Goals - 04/21/21 1003       PT LONG  TERM GOAL #1   Title Pt will demo decreased abdominal separation from 4 fingers width  to < 1 fingers width to improve IAP system for pelvic floor function    Time 4    Period Weeks    Status Achieved      PT LONG TERM GOAL #2   Title Pt will demo improved posture with more anterior tilt of pelvis, increased lumbar lordosis, and no cues for proper sitting/ standing posture  in order to minimize CLBP and return to more functional activities    Time 6    Period Weeks    Status Achieved      PT LONG TERM GOAL #3   Title Pt will demo no spinal deviation and more levelled pelvic girdle across 2 visits in order to advance towards deep core/ thoracolumbar strengthening exercises    Time 2    Period Weeks    Status Achieved  PT LONG TERM GOAL #4   Title Pt will demo decreased R pelvic floor mm tightness across 2 visits in order to minimize pelvic pain and straining with bowel movements and optimize IAP system properly    Time 8    Period Weeks    Status Achieved      PT LONG TERM GOAL #5   Title Pt's FOTO score for Pain to decrease from 29pts to < 24pts and Urinary from 38 pts to < 30 pts and Bowel from 17 pts to < 12 pts and  Bowel Leakage improve 59 pts to > 64 pts    Baseline 11/8:Urinary  38 pts to 33 pts, PFDI Bowel 17pts to 4pts, bowel Leakage 59pts to 72 pts   07/21/20:    Time 10    Period Weeks    Status Partially Met      PT LONG TERM GOAL #6   Title Pt will report no urge to urinate when sitting or bending forward to put on his back pack  in order to attend meetings or leave the house    Time 8    Period Weeks    Status On-going      PT LONG TERM GOAL #7   Title Pt 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    Time 10    Period Weeks    Status Achieved      PT LONG TERM GOAL  #10   TITLE Pt will be able to hike for 59mles on low grade trail and/or 2 miles on a trail with inclines and report no pain .    Time 6    Period Months    Status On-going                   Plan - 06/16/21 1001     Clinical Impression Statement Pt is still benefitting from skilled PT to progress towards remaining 4 goals. Todaypt showed increased tightenss of L pelvic floor mm at anterior mm and perineum. Pt has made great progress with psychotherapy and has reflected today that he accepts his Sx and no longer reaching for solutions out of desperation.     Pt went to see urologist who offered medication to help with urgency.  Going to see her helped him to emotionally experience a new change  because he learned that was no specific  solution to chronic pelvic pain.  [facility]. Pt is accepting "This is the new [place]" and practice getting better of dealing with it, mentally ,physically and work with providers. "I will not let it crush my life". "I have learned alot and  to go to counseling." He appreciates his providers. It was so hard intitially to reach out to talk to anybody about it.  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 "I am still under this dark cloud. I am coming to terms that it is ok. I am handling this dark cloud that is always going to be there. I am dealing with it.  I have an umbrella. "   Pt has found a psoas release technique on line where he lies on a ball. Pt has tried it for 4 weeks , 3 x week. This techniques has helped him to experience less pain, he can  stand up staighter, more relaxed and calm in the pelvic area and  also, relax his penis when he has to urinate. But his urgency has increased since doing this technique. Pt is careful now not putting too much pressure with the ball because it had caused shooting pain to the penis when he pressed too hard the ball.   Provided active listening to pt's reflections on his healing journey. Discussed future sessions with focus on yoga postures to help with pelvic tightness on L and overall somatic experiencing of body. Reviewed  stretches with yoga strap today and advised pt to try strap stretches again and take caution with the  use of the ball psoas release as it has increased urgency for him and the tennis ball under gluts as it had caused a relapse in the past. Pt continues to benefit from skilled PT.     Examination-Activity Limitations Toileting;Sit    Stability/Clinical Decision Making Evolving/Moderate complexity    Rehab Potential Good    PT Frequency Monthy    PT Duration Other (comment)   6   PT Treatment/Interventions Neuromuscular re-education;Patient/family education;Therapeutic exercise;Scar mobilization;Taping;Manual techniques;Moist Heat;Therapeutic activities;Gait training;Balance training    Consulted and Agree with Plan of Care Patient             Patient will benefit from skilled therapeutic intervention in order to improve the following deficits and impairments:  Abnormal gait, Decreased coordination, Decreased range of motion, Difficulty walking, Decreased endurance, Decreased safety awareness, Increased muscle spasms, Decreased balance, Decreased strength, Decreased mobility, Postural dysfunction, Improper body mechanics, Decreased scar mobility, Decreased activity tolerance, Increased fascial restricitons, Hypomobility  Visit Diagnosis: Sacrococcygeal disorders, not elsewhere classified  Pelvic pain  Urinary frequency  Other abnormalities of gait and mobility  Chronic bilateral low back pain with left-sided sciatica  Abnormal  posture  Other idiopathic scoliosis, thoracolumbar region     Problem List Patient Active Problem List   Diagnosis Date Noted   Excessive sleepiness 01/30/2021   Urinary frequency 12/23/2019   Pelvic pain 12/23/2019   Low back pain 12/22/2019    Jerl Mina, PT 06/16/2021, 10:26 AM  [place], [place], [address] Phone: [phone]   Fax:  [phone]  Name: Alec Reynolds MRN: [id number] Date of Birth: 12-10-1970

## 2021-06-22 ENCOUNTER — Ambulatory Visit: Payer: 59 | Admitting: Physical Therapy

## 2021-06-22 ENCOUNTER — Other Ambulatory Visit: Payer: Self-pay

## 2021-06-22 DIAGNOSIS — G8929 Other chronic pain: Secondary | ICD-10-CM

## 2021-06-22 DIAGNOSIS — R102 Pelvic and perineal pain unspecified side: Secondary | ICD-10-CM

## 2021-06-22 DIAGNOSIS — R2689 Other abnormalities of gait and mobility: Secondary | ICD-10-CM

## 2021-06-22 DIAGNOSIS — M533 Sacrococcygeal disorders, not elsewhere classified: Secondary | ICD-10-CM | POA: Diagnosis not present

## 2021-06-22 DIAGNOSIS — R350 Frequency of micturition: Secondary | ICD-10-CM

## 2021-06-22 DIAGNOSIS — M4125 Other idiopathic scoliosis, thoracolumbar region: Secondary | ICD-10-CM

## 2021-06-22 DIAGNOSIS — R293 Abnormal posture: Secondary | ICD-10-CM

## 2021-06-22 NOTE — Therapy (Addendum)
[place] MAIN [facility] SERVICES [address] [place], [place], [address] Phone: [phone]   Fax:  [phone]  Physical Therapy Treatment  Patient Details  Name: Alec Reynolds MRN: [id number] Date of Birth: Jul 15, 1970 No data recorded  Encounter Date: 06/22/2021   PT End of Session - 06/22/21 1555     Visit Number 6    Date for PT Re-Evaluation 08/25/21   PN 05/09/20, 07/21/20, 12/29/20, 04/20/21,   Authorization Type 30 visits of PT./OT/ chiro per year  (insurance provided more visits 6    PT Start Time 1255    PT Stop Time 1400    PT Time Calculation (min) 65 min    Activity Tolerance Patient tolerated treatment well;No increased pain    Behavior During Therapy [facility] for tasks assessed/performed             No past medical history on file.  Past Surgical History:  Procedure Laterality Date   ELBOW SURGERY Left    WRIST SURGERY      There were no vitals filed for this visit.   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 Pt has been spending time with grandson.    Pertinent History R groin injury 2017R ,  anxiety, stress Fx in R shin, low back as kid. 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 Pt also enjoyed cycling and stopped when the pain started. Pt used to cycle 2-3 hours 200 miles / day. Pt did not have a stretch routine. Pt completed Pelvic PT via telehealth one year and tried stretching but it made it worse.    Patient Stated Goals sit down for 2 hours  without hurting, Not  have to feel like he has to pee all the time                [facility] PT Assessment - 06/22/21 1600       Observation/Other Assessments   Observations poor alignment of knee, slight pronation of back foot with warrior poses, upper trap overuse, required use of bloacks to make transition to half kneeling  with less breathholding             Tx: yoga postures selected for pelvic floor/ hip mobility and medial arch strengthening and diassociation learning.              Chair pose Twist in chair pose Warrior I  -> II --> pyramid --> III Other side  Tree pose -> foot at ankle, head turns  Other side  Chair pose-> lunge with blocks  Half kneeling with blocks: twist to same and opp side  Dragonfly stretch with blocks Gate pose  Lower to floor ->childpose Mini cobra Locust pose  Side plank propped on forearms, top knee bent Other side   Butterfly with blocks  Seated and on back  Happy baby Happy baby with hamstring stretch  Savasana  Guided relaxation in and out of it   Seated postural awareness       and toning              [facility] Adult PT Treatment/Exercise - 06/22/21 1559       Neuro Re-ed    Neuro Re-ed Details  cued for diassociation of trunk rotaiton from pelvis, use of yoga props to minimize4 excessive stretching  and promote more relaxation of spine and hips, cued for alignmetn ofn knees and ankle alignment to minimzie pronation, cued for SLS balance with ankle stability                          PT Long Term Goals - 04/21/21 1003       PT LONG TERM GOAL #1   Title Pt will demo decreased abdominal separation from 4 fingers width  to < 1 fingers width to improve IAP system for pelvic floor function    Time 4    Period Weeks    Status Achieved      PT LONG TERM GOAL #2   Title Pt will demo improved posture with more anterior tilt of pelvis, increased lumbar lordosis, and no cues for proper sitting/ standing posture  in order to minimize CLBP and return to more functional activities    Time 6    Period Weeks    Status Achieved      PT LONG TERM GOAL #3   Title Pt will demo no spinal deviation and more levelled pelvic girdle across 2 visits in order to advance towards deep core/ thoracolumbar strengthening exercises    Time 2     Period Weeks    Status Achieved      PT LONG TERM GOAL #4   Title Pt will demo decreased R pelvic floor mm tightness across 2 visits in order to minimize pelvic pain and straining with bowel movements and optimize IAP system properly    Time 8    Period Weeks    Status Achieved      PT LONG TERM GOAL #5   Title Pt's FOTO score for Pain to decrease from 29pts to < 24pts and Urinary from 38 pts to < 30 pts and Bowel from 17 pts to < 12 pts and  Bowel Leakage improve 59 pts to > 64 pts    Baseline 11/8:Urinary  38 pts to 33 pts, PFDI Bowel 17pts to 4pts, bowel Leakage 59pts to 72 pts   07/21/20:    Time 10    Period Weeks    Status Partially Met      PT LONG TERM GOAL #6   Title Pt will report no urge to urinate when sitting or bending forward to put on his back pack  in order to attend meetings or leave the house    Time 8    Period Weeks    Status On-going      PT LONG TERM GOAL #7   Title 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    Time 10    Period Weeks    Status Achieved      PT LONG TERM GOAL  #10   TITLE Pt will be able to hike for 70mles on  low grade trail and/or 2 miles on a trail with inclines and report no pain .    Time 6    Period Months    Status On-going                   Plan - 06/22/21 1556     Clinical Impression Statement Pt demo'd good flexibility with standing yoga postures but required cues for diassociation between trunk and pelvis and required use of yoga blocks in half kneeling position. Seated position on floor  required blocks plus 3 folded towels to maintain anterior tilt of pelvis which is the position that is for lengthening pelvic floor and area of his pain/ tightness. Pt also required cues to promote more arch activation and minimize pronation of feet and less upper trap overuse. Pt reported feeling relaxed and noticed the pelvic pain and the area being tight and then stretched in the positions. Pt continues to benefit from skilled PT.     Examination-Activity Limitations Toileting;Sit    Stability/Clinical Decision Making Evolving/Moderate complexity    Rehab Potential Good    PT Frequency Monthy    PT Duration Other (comment)   6   PT Treatment/Interventions Neuromuscular re-education;Patient/family education;Therapeutic exercise;Scar mobilization;Taping;Manual techniques;Moist Heat;Therapeutic activities;Gait training;Balance training    Consulted and Agree with Plan of Care Patient             Patient will benefit from skilled therapeutic intervention in order to improve the following deficits and impairments:  Abnormal gait, Decreased coordination, Decreased range of motion, Difficulty walking, Decreased endurance, Decreased safety awareness, Increased muscle spasms, Decreased balance, Decreased strength, Decreased mobility, Postural dysfunction, Improper body mechanics, Decreased scar mobility, Decreased activity tolerance, Increased fascial restricitons, Hypomobility  Visit Diagnosis: Sacrococcygeal disorders, not elsewhere classified  Pelvic pain  Urinary frequency  Other abnormalities of gait and mobility  Chronic bilateral low back pain with left-sided sciatica  Abnormal posture  Other idiopathic scoliosis, thoracolumbar region     Problem List Patient Active Problem List   Diagnosis Date Noted   Excessive sleepiness 01/30/2021   Urinary frequency 12/23/2019   Pelvic pain 12/23/2019   Low back pain 12/22/2019    Jerl Mina, PT 06/22/2021, 4:02 PM  [place] MAIN [facility] SERVICES [address] [place], [place], [address] Phone: [phone]   Fax:  [phone]  Name: Gino Garrabrant MRN: [id number] Date of Birth: Jan 27, 1971

## 2021-06-29 ENCOUNTER — Ambulatory Visit: Payer: 59 | Admitting: Physical Therapy

## 2021-07-04 ENCOUNTER — Ambulatory Visit: Payer: 59 | Admitting: Physical Therapy

## 2021-07-19 ENCOUNTER — Other Ambulatory Visit: Payer: Self-pay | Admitting: Urology

## 2021-07-19 ENCOUNTER — Ambulatory Visit: Payer: 59 | Admitting: Physical Therapy

## 2021-07-19 ENCOUNTER — Other Ambulatory Visit: Payer: Self-pay | Admitting: *Deleted

## 2021-07-19 DIAGNOSIS — G8929 Other chronic pain: Secondary | ICD-10-CM

## 2021-07-19 DIAGNOSIS — R102 Pelvic and perineal pain: Secondary | ICD-10-CM

## 2021-07-19 DIAGNOSIS — M6289 Other specified disorders of muscle: Secondary | ICD-10-CM

## 2021-07-19 MED ORDER — GEMTESA 75 MG PO TABS: 75.0000 mg | ORAL_TABLET | Freq: Every day | ORAL | 3 refills | Status: DC

## 2021-08-02 ENCOUNTER — Encounter: Payer: Self-pay | Admitting: Physical Therapy

## 2021-08-03 ENCOUNTER — Other Ambulatory Visit: Payer: Self-pay

## 2021-08-03 ENCOUNTER — Ambulatory Visit: Payer: 59 | Attending: Physician Assistant | Admitting: Physical Therapy

## 2021-08-03 DIAGNOSIS — R2689 Other abnormalities of gait and mobility: Secondary | ICD-10-CM | POA: Diagnosis not present

## 2021-08-03 DIAGNOSIS — G8929 Other chronic pain: Secondary | ICD-10-CM | POA: Insufficient documentation

## 2021-08-03 DIAGNOSIS — M5442 Lumbago with sciatica, left side: Secondary | ICD-10-CM | POA: Diagnosis not present

## 2021-08-03 DIAGNOSIS — M533 Sacrococcygeal disorders, not elsewhere classified: Secondary | ICD-10-CM | POA: Insufficient documentation

## 2021-08-03 DIAGNOSIS — M4125 Other idiopathic scoliosis, thoracolumbar region: Secondary | ICD-10-CM | POA: Insufficient documentation

## 2021-08-03 DIAGNOSIS — R293 Abnormal posture: Secondary | ICD-10-CM | POA: Insufficient documentation

## 2021-08-03 DIAGNOSIS — R350 Frequency of micturition: Secondary | ICD-10-CM | POA: Insufficient documentation

## 2021-08-03 DIAGNOSIS — R102 Pelvic and perineal pain: Secondary | ICD-10-CM | POA: Insufficient documentation

## 2021-08-03 NOTE — Therapy (Signed)
[place] MAIN [facility] SERVICES [address] [place], [place], [address] Phone: [phone]   Fax:  [phone]  Physical Therapy Treatment  Patient Details  Name: Alec Reynolds MRN: [id number] Date of Birth: [birth year] No data recorded  Encounter Date: 08/03/2021   PT End of Session - 08/03/21 1620     Visit Number 45    Date for PT Re-Evaluation 08/25/21   PN 05/09/20, 07/21/20, 12/29/20, 04/20/21,   Authorization Type 1/35 visits of PT./OT/ chiro per year    PT Start Time 1300    PT Stop Time 1400    PT Time Calculation (min) 60 min    Activity Tolerance Patient tolerated treatment well;No increased pain    Behavior During Therapy [facility] for tasks assessed/performed             No past medical history on file.  Past Surgical History:  Procedure Laterality Date   ELBOW SURGERY Left    WRIST SURGERY      There were no vitals filed for this visit.   Subjective Assessment - 08/03/21 1306     Subjective Pt feels no change to urination Sx but last week he felt more relaxed in the pelvic area. Pt feels he is mentally getting better at managing it. When he is standing to pee in the mornings, the pain was excruiating but he is ok. Pain went away after urinating. Pt is not having as many nocturnal erections which had caused worsening of pain and urgency. Pt is working with a Economist for 3 sessions and feels really good because he wants to get mentally healthier. Pt feels the life coach will help him to "mentally move forward". Pt still sees his psychotherapist once a week which helps him "clear the drains".  Pt  has started to walk , swim. Pt swam for 12 laps ( freestyle and breaststroke). Pt slight pain in pelvic area with leg stroke in breast stroke, and felt urge to pee once out of the pool. Pt is going to try to keep swimming. Pt signed up for an evening class to learn about Retirement Planning. Pt is uncomfortable with academic settings.  Pt will be sitting on hard chairs which causes pain.    Pertinent History R groin injury 2017R ,  anxiety, stress Fx in R shin, low back as kid. 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 Pt also enjoyed cycling and stopped when the pain started. Pt used to cycle 2-3 hours 200 miles / day. Pt did not have a stretch routine. Pt completed Pelvic PT via telehealth one year and tried stretching but it made it worse.    Patient Stated Goals sit down for 2 hours  without hurting, Not  have to feel like he has to pee all the time                [facility] PT Assessment - 08/03/21 1402       Observation/Other Assessments   Observations navicular space from floor: 2 cm Bilaterally ( 1.5 cm in the past)      Other:   Other/ Comments torsion of hips with ptossing tennis which pt does hundreds of reps all week, with hip IR, adduction of knee, medial drop of plantar arch, static position of lower chain      Strength   Overall Strength Comments oblique overuse on R with R  hip ext      Palpation   Spinal mobility no more forward head/ thoracic kyphosis    SI assessment  levelled pelvis    Palpation comment tightness at glut  and posterior hip abd/ coccygeus mm L , IT band/ lateral leg/ lateral foot hypomobile .  referred pain to glut with palpation at dorsum of foot/ moving into DF/EV                           [facility] Adult PT Treatment/Exercise - 08/03/21 1453       Therapeutic Activites    Other Therapeutic Activities modified repeated task at work as Audiological scientist to address tightness at L glut/ lower kinetic chain, active listening to update / future goals with swimming practice/ sitting on hard surfaces at upcoming classes      Neuro Re-ed    Neuro Re-ed Details  cued for modification to tossing tennis balls with less torsion at hips / hip IR/ knee adduction, medial collapse of L foot      Manual Therapy   Manual  therapy comments STM/MWM to decrease tightness at glut  and posterior hip abd/ coccygeus mm L , IT band/ lateral leg/ lateral foot hypomobile                          PT Long Term Goals - 08/03/21 1403       PT LONG TERM GOAL #1   Title Pt will demo decreased abdominal separation from 4 fingers width  to < 1 fingers width to improve IAP system for pelvic floor function    Time 4    Period Weeks    Status Achieved      PT LONG TERM GOAL #2   Title Pt will demo improved posture with more anterior tilt of pelvis, increased lumbar lordosis, and no cues for proper sitting/ standing posture  in order to minimize CLBP and return to more functional activities    Time 6    Period Weeks    Status Achieved      PT LONG TERM GOAL #3   Title Pt will demo no spinal deviation and more levelled pelvic girdle across 2 visits in order to advance towards deep core/ thoracolumbar strengthening exercises    Time 2    Period Weeks    Status Achieved      PT LONG TERM GOAL #4   Title Pt will demo decreased R pelvic floor mm tightness across 2 visits in order to minimize pelvic pain and straining with bowel movements and optimize IAP system properly    Time 8    Period Weeks    Status Achieved      PT LONG TERM GOAL #5   Title Pt's FOTO score for Pain to decrease from 29pts to < 24pts and Urinary from 38 pts to < 30 pts and Bowel from 17 pts to < 12 pts and  Bowel Leakage improve 59 pts to > 64 pts    Baseline 11/8:Urinary  38 pts to 33 pts, PFDI Bowel 17pts to 4pts, bowel Leakage 59pts to 72 pts   07/21/20:    Time 10    Period Weeks    Status Partially Met      PT LONG TERM GOAL #6   Title Pt will report no urge to urinate when sitting or bending forward to put on his back pack  in  order to attend meetings or leave the house    Time 8    Period Weeks    Status On-going      PT LONG TERM GOAL #7   Title Pt will report no pelvic pain when bending over to pick up a 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    Time 10    Period Weeks    Status Achieved      PT LONG TERM GOAL  #10   TITLE Pt will be able to hike for 28mles on low grade trail and/or 2 miles on a trail with inclines and report no pain .    Time 6    Period Months    Status On-going      PT LONG TERM GOAL  #11   TITLE Pt will maintain 2 cm of plantar arches and and no tightness at L ITband/ around GT and lateral leg, hypomobility at lateral foot with compliance with modification with tossing tennis balls repeated at work    Time 10    Period Weeks    Status New    Target Date 10/12/21                   Plan - 08/03/21 1621     Clinical Impression Statement Pt returns to PT after one month. Pt has started to work with a lEconomistwhich he feels is very helpful while he also continues to work with a pManagement consultant   Pt showed good maintenance with levelled pelvic girdle, no more forward head posture, and increased plantar arches ( navicular drop improved from 1.5 cm to 2 cm bilaterally). Pt 's tightness on lateral foot of L, lateral leg, adductor, glut/ hamstring found on assessment today is likely related to his L pelvic pain and glut pain and is associated with repeated task at work as a tAudiological scientist Pt demo'd task which involved torsion of hip with IR of hip, medial drop of L foot with tossing tennis ball. Following manual Tx to mobilize tight areas, pt reported less pain with sitting. Pt demo'd correct technique with modification to tossing tennis balls with less hip torsion and more co-activation of deep core and feet  arches to minimize tightness of the areas addressed today  Plan to provide aquatic therapy program customized to address his asymmetrical presentations because pt started swimming and enjoying it.   Pt continues to benefit from skilled PT    Examination-Activity Limitations Toileting;Sit    Stability/Clinical Decision Making Evolving/Moderate complexity    Rehab Potential Good    PT Frequency Monthy    PT Duration Other (comment)   6   PT Treatment/Interventions Neuromuscular re-education;Patient/family education;Therapeutic exercise;Scar mobilization;Taping;Manual techniques;Moist Heat;Therapeutic activities;Gait training;Balance training    Consulted and Agree with Plan of Care Patient             Patient will benefit from skilled therapeutic intervention in order to  improve the following deficits and impairments:  Abnormal gait, Decreased coordination, Decreased range of motion, Difficulty walking, Decreased endurance, Decreased safety awareness, Increased muscle spasms, Decreased balance, Decreased strength, Decreased mobility, Postural dysfunction, Improper body mechanics, Decreased scar mobility, Decreased activity tolerance, Increased fascial restricitons, Hypomobility  Visit Diagnosis: Sacrococcygeal disorders, not elsewhere classified  Pelvic pain  Urinary frequency  Other abnormalities of gait and mobility  Chronic bilateral low back pain with left-sided sciatica  Abnormal posture  Other idiopathic scoliosis, thoracolumbar region     Problem List Patient Active Problem List   Diagnosis Date Noted   Excessive sleepiness 01/30/2021   Urinary frequency 12/23/2019   Pelvic pain 12/23/2019   Low back pain 12/22/2019    Jerl Mina, PT 08/03/2021, 4:32 PM  [place], [place], [address] Phone: [phone]   Fax:  [phone]  Name: Alec Reynolds MRN: [id number] Date  of Birth: [birth year]

## 2021-08-03 NOTE — Patient Instructions (Signed)
Tennis ball tossing with R foot step forward on ballmound, less turn of hips, toss, then step back on L foot on ballmound  Mindful to alignment of hips/knees/ toes  __  Seated hip flexor/ quad Seated figure -4 , cross thigh over with twist

## 2021-08-07 DIAGNOSIS — Z5181 Encounter for therapeutic drug level monitoring: Secondary | ICD-10-CM | POA: Diagnosis not present

## 2021-08-07 DIAGNOSIS — Z125 Encounter for screening for malignant neoplasm of prostate: Secondary | ICD-10-CM | POA: Diagnosis not present

## 2021-08-10 ENCOUNTER — Other Ambulatory Visit: Payer: Self-pay

## 2021-08-10 ENCOUNTER — Ambulatory Visit: Payer: 59 | Admitting: Physical Therapy

## 2021-08-10 DIAGNOSIS — M533 Sacrococcygeal disorders, not elsewhere classified: Secondary | ICD-10-CM | POA: Diagnosis not present

## 2021-08-10 DIAGNOSIS — R293 Abnormal posture: Secondary | ICD-10-CM

## 2021-08-10 DIAGNOSIS — M4125 Other idiopathic scoliosis, thoracolumbar region: Secondary | ICD-10-CM | POA: Diagnosis not present

## 2021-08-10 DIAGNOSIS — G8929 Other chronic pain: Secondary | ICD-10-CM | POA: Diagnosis not present

## 2021-08-10 DIAGNOSIS — R102 Pelvic and perineal pain: Secondary | ICD-10-CM

## 2021-08-10 DIAGNOSIS — M5442 Lumbago with sciatica, left side: Secondary | ICD-10-CM | POA: Diagnosis not present

## 2021-08-10 DIAGNOSIS — R350 Frequency of micturition: Secondary | ICD-10-CM

## 2021-08-10 DIAGNOSIS — R2689 Other abnormalities of gait and mobility: Secondary | ICD-10-CM

## 2021-08-10 NOTE — Patient Instructions (Signed)
Pool:  Stretches  Walking backward with pool noodles on surface, elbows 90deg, by ribs the whole time   L side stepping ( hip width apart) no noodle, arm movement ( accordian)   Heel raises with noodle behind back    Swim  side crawl both sides and notice the water  1-lap freestyle, breast stroke, back stroke each  Board in front, freestyle kicks      Cool down: Relax onto noodles under neck, armpit, back, knees :with Snow angels arms and legs 45 deg from midline  Explore and otter time  [employer] time

## 2021-08-10 NOTE — Therapy (Signed)
[place] MAIN [facility] SERVICES [address] [place], [place], [address] Phone: [phone]   Fax:  [phone]  Physical Therapy Treatment  Patient Details  Name: Alec Reynolds MRN: [id number] Date of Birth: 1971/02/04 No data recorded  Encounter Date: 08/10/2021   PT End of Session - 08/10/21 1825     Visit Number 46    Date for PT Re-Evaluation 08/25/21   PN 05/09/20, 07/21/20, 12/29/20, 04/20/21,   Authorization Type [id number] visits of PT./OT/ chiro per year    PT Start Time 1305    PT Stop Time 1400    PT Time Calculation (min) 55 min    Activity Tolerance Patient tolerated treatment well;No increased pain    Behavior During Therapy [facility] for tasks assessed/performed             No past medical history on file.  Past Surgical History:  Procedure Laterality Date   ELBOW SURGERY Left    WRIST SURGERY      There were no vitals filed for this visit.   Subjective Assessment - 08/10/21 1311     Subjective Pt felt better sitting on a hard chair at his life coach's office after last session. 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 Pt noticed he peed less last week with these modifications.    Pertinent History R groin injury 2017R ,  anxiety, stress Fx in R shin, low back as kid. 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 Pt also enjoyed cycling and stopped when the pain started. Pt used to cycle 2-3 hours 200 miles / day. Pt did not have a stretch routine. Pt completed Pelvic PT via telehealth one year and tried stretching but it made it worse.    Patient Stated Goals sit down for 2 hours  without hurting, Not  have to feel like he has to pee all the time                [facility] PT Assessment - 08/10/21 1317       Observation/Other Assessments   Observations posterior COM with modified  technique with tennis ball  tossing      Strength   Overall Strength Comments L hip abd 43+/5, R 5/5      Palpation   Palpation comment tightness at L glut/ ITband. hamstring                           [facility] Adult PT Treatment/Exercise - 08/10/21 1822       Therapeutic Activites    Other Therapeutic Activities customized aquatic PT exericses for L hip strengthening, plantar arch strengthening , propioception training      Neuro Re-ed    Neuro Re-ed Details  cued for anterior COM with tossing tennis balls to promote stronger plantar stretch in back foot      Manual Therapy   Manual therapy comments STM/MWM to decrease tightness at glut  and posterior hip abd/ coccygeus mm L , IT band/ lateral leg/                          PT Long Term Goals - 08/03/21 1403       PT LONG TERM GOAL #1   Title  Pt will demo decreased abdominal separation from 4 fingers width  to < 1 fingers width to improve IAP system for pelvic floor function    Time 4    Period Weeks    Status Achieved      PT LONG TERM GOAL #2   Title Pt will demo improved posture with more anterior tilt of pelvis, increased lumbar lordosis, and no cues for proper sitting/ standing posture  in order to minimize CLBP and return to more functional activities    Time 6    Period Weeks    Status Achieved      PT LONG TERM GOAL #3   Title Pt will demo no spinal deviation and more levelled pelvic girdle across 2 visits in order to advance towards deep core/ thoracolumbar strengthening exercises    Time 2    Period Weeks    Status Achieved      PT LONG TERM GOAL #4   Title Pt will demo decreased R pelvic floor mm tightness across 2 visits in order to minimize pelvic pain and straining with bowel movements and optimize IAP system properly    Time 8    Period Weeks    Status Achieved      PT LONG TERM GOAL #5   Title Pt's FOTO score for Pain to decrease from 29pts to < 24pts and Urinary from 38  pts to < 30 pts and Bowel from 17 pts to < 12 pts and  Bowel Leakage improve 59 pts to > 64 pts    Baseline 11/8:Urinary  38 pts to 33 pts, PFDI Bowel 17pts to 4pts, bowel Leakage 59pts to 72 pts   07/21/20:    Time 10    Period Weeks    Status Partially Met      PT LONG TERM GOAL #6   Title Pt will report no urge to urinate when sitting or bending forward to put on his back pack  in order to attend meetings or leave the house    Time 8    Period Weeks    Status On-going      PT LONG TERM GOAL #7   Title 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    Time 10    Period Weeks    Status Achieved      PT LONG TERM GOAL  #10   TITLE Pt will be able to hike for 20mles on low grade trail and/or 2 miles on a trail with inclines and report no pain .    Time 6    Period Months    Status On-going      PT LONG TERM GOAL  #11   TITLE Pt will maintain 2 cm of plantar arches and and no tightness at L ITband/ around GT and lateral leg, hypomobility at lateral foot with compliance with  modification with tossing tennis balls repeated at work    Time 10    Period Weeks    Status New    Target Date 10/12/21                   Plan - 08/10/21 1312     Clinical Impression Statement Pt felt better sitting on a hard chair at his life coach's office after last session. 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 Pt noticed he peed less last week with these modifications.   Cued for anterior COM  with tossing tennis balls to promote stronger plantar stretch in back foot. Continued to address L glut/ ITband tightness with  manual Tx.Customized aquatic PT exericses for L hip strengthening, plantar arch strengthening , propioception training .  Pt continues to  benefit from skilled PT   Examination-Activity Limitations Toileting;Sit    Stability/Clinical Decision Making Evolving/Moderate complexity    Rehab Potential Good    PT Frequency Monthy    PT Duration Other (comment)   6   PT Treatment/Interventions Neuromuscular re-education;Patient/family education;Therapeutic exercise;Scar mobilization;Taping;Manual techniques;Moist Heat;Therapeutic activities;Gait training;Balance training    Consulted and Agree with Plan of Care Patient             Patient will benefit from skilled therapeutic intervention in order to improve the following deficits and impairments:  Abnormal gait, Decreased coordination, Decreased range of motion, Difficulty walking, Decreased endurance, Decreased safety awareness, Increased muscle spasms, Decreased balance, Decreased strength, Decreased mobility, Postural dysfunction, Improper body mechanics, Decreased scar mobility, Decreased activity tolerance, Increased fascial restricitons, Hypomobility  Visit Diagnosis: Sacrococcygeal disorders, not elsewhere classified  Pelvic pain  Urinary frequency  Other abnormalities of gait and mobility  Chronic bilateral low back pain with left-sided sciatica  Abnormal posture  Other idiopathic scoliosis, thoracolumbar region     Problem List Patient Active Problem List   Diagnosis Date Noted   Excessive sleepiness 01/30/2021   Urinary frequency 12/23/2019   Pelvic pain 12/23/2019   Low back pain 12/22/2019    Jerl Mina, PT 08/10/2021, 6:26 PM  [place] MAIN [facility] SERVICES [address] [place], [place], [address] Phone: [phone]   Fax:   [phone]  Name: Alec Reynolds MRN: [id number] Date of Birth: 01-15-1971

## 2021-08-15 ENCOUNTER — Encounter: Payer: Self-pay | Admitting: Physical Therapy

## 2021-08-17 ENCOUNTER — Ambulatory Visit: Payer: 59 | Admitting: Physical Therapy

## 2021-08-17 ENCOUNTER — Other Ambulatory Visit: Payer: Self-pay

## 2021-08-17 DIAGNOSIS — M533 Sacrococcygeal disorders, not elsewhere classified: Secondary | ICD-10-CM

## 2021-08-17 DIAGNOSIS — R2689 Other abnormalities of gait and mobility: Secondary | ICD-10-CM

## 2021-08-17 DIAGNOSIS — R350 Frequency of micturition: Secondary | ICD-10-CM

## 2021-08-17 DIAGNOSIS — G8929 Other chronic pain: Secondary | ICD-10-CM | POA: Diagnosis not present

## 2021-08-17 DIAGNOSIS — M5442 Lumbago with sciatica, left side: Secondary | ICD-10-CM | POA: Diagnosis not present

## 2021-08-17 DIAGNOSIS — M4125 Other idiopathic scoliosis, thoracolumbar region: Secondary | ICD-10-CM | POA: Diagnosis not present

## 2021-08-17 DIAGNOSIS — R293 Abnormal posture: Secondary | ICD-10-CM

## 2021-08-17 DIAGNOSIS — R102 Pelvic and perineal pain: Secondary | ICD-10-CM

## 2021-08-18 NOTE — Therapy (Signed)
[place] MAIN [facility] SERVICES [address] [place], [place], [address] Phone: [phone]   Fax:  [phone]  Physical Therapy Treatment  Patient Details  Name: Alec Reynolds MRN: [id number] Date of Birth: 1970/09/02 No data recorded  Encounter Date: 08/17/2021   PT End of Session - 08/17/21 1346     Visit Number 53    Date for PT Re-Evaluation 08/25/21   PN 05/09/20, 07/21/20, 12/29/20, 04/20/21,   Authorization Type 3/ 35 visits of PT./OT/ chiro per year    PT Start Time 1304    PT Stop Time 1400    PT Time Calculation (min) 56 min    Activity Tolerance Patient tolerated treatment well;No increased pain    Behavior During Therapy [facility] for tasks assessed/performed             No past medical history on file.  Past Surgical History:  Procedure Laterality Date   ELBOW SURGERY Left    WRIST SURGERY      There were no vitals filed for this visit.   Subjective Assessment - 08/17/21 1348     Subjective Pt reported he sat at a bar stool which was hard and wooden for 2 hours. Pt had increased pain. Pt noticed he was not freaking out with the increased in pain. Pt was depressed the next morning due to the intensity of the pain. Pt decreased as the week progressed. Pt did his stretches and hot baths. Pt is getting more comfortable and being ok. Pt's nocturnal erections still occur but pain afterwards is 25% less. Pt has been modifying his repeated tasks with tennis with less twisting                [facility] PT Assessment - 08/18/21 0955       Observation/Other Assessments   Observations good carry over with modified throwing tennis movements      Palpation   Palpation comment tightness along adductor/ hamstrings attachment to ischia rami, tightness at  medial/ lateral knee, lateral leg, ITband L                           [facility] Adult PT Treatment/Exercise - 08/18/21 0957       Therapeutic Activites     Other Therapeutic Activities active listening, positive reinforcement      Neuro Re-ed    Neuro Re-ed Details  cued for adductor stretches      Manual Therapy   Manual therapy comments distraction at knee, superior glide of fibula, STM/MWM at mm noted in assessments to promote hip/ tibia ER                          PT Long Term Goals - 08/03/21 1403       PT LONG TERM GOAL #1   Title Pt will demo decreased abdominal separation from 4 fingers width  to < 1 fingers width to improve IAP system for pelvic floor function    Time 4    Period Weeks    Status Achieved      PT LONG TERM GOAL #2   Title Pt will demo improved posture with more anterior tilt of pelvis, increased lumbar lordosis, and no cues for proper sitting/ standing posture  in order to minimize CLBP and return to more functional activities    Time 6    Period Weeks    Status Achieved  PT LONG TERM GOAL #3   Title Pt will demo no spinal deviation and more levelled pelvic girdle across 2 visits in order to advance towards deep core/ thoracolumbar strengthening exercises    Time 2    Period Weeks    Status Achieved      PT LONG TERM GOAL #4   Title Pt will demo decreased R pelvic floor mm tightness across 2 visits in order to minimize pelvic pain and straining with bowel movements and optimize IAP system properly    Time 8    Period Weeks    Status Achieved      PT LONG TERM GOAL #5   Title Pt's FOTO score for Pain to decrease from 29pts to < 24pts and Urinary from 38 pts to < 30 pts and Bowel from 17 pts to < 12 pts and  Bowel Leakage improve 59 pts to > 64 pts    Baseline 11/8:Urinary  38 pts to 33 pts, PFDI Bowel 17pts to 4pts, bowel Leakage 59pts to 72 pts   07/21/20:    Time 10    Period Weeks    Status Partially Met      PT LONG TERM GOAL #6   Title Pt will report no urge to urinate when sitting or bending forward to put on his back pack  in order to attend meetings or leave the house     Time 8    Period Weeks    Status On-going      PT LONG TERM GOAL #7   Title 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    Time 10    Period Weeks    Status Achieved      PT LONG TERM GOAL  #10   TITLE Pt will be able to hike for 35mles on low grade trail and/or 2 miles on a trail with inclines and report no pain .    Time 6    Period Months    Status On-going      PT LONG TERM GOAL  #11   TITLE Pt will maintain 2 cm of plantar arches and and no tightness at L ITband/ around GT and lateral leg, hypomobility at lateral foot with compliance with modification with tossing tennis balls repeated at work    Time 10    Period Weeks    Status New    Target Date 10/12/21                   Plan - 08/18/21 0955     Clinical Impression Statement Pt managed his pain after pain increased from sitting on hard wooden stool at a bar when he met with a friend. Pt demo'd increased mm tightness at L leg, adductor/ hamstring/ IT band. Pt is practicing variable positions with repeated task at work as a tAudiological scientist  which is helping to not IR his L hip and cause medial collapse of foot/ shortening of posterior mm. Provided active listening and positive reinforcement with his self-care practices. Pt continues to benefit from skilled PT.   Examination-Activity Limitations Toileting;Sit    Stability/Clinical Decision Making Evolving/Moderate complexity    Rehab Potential Good    PT Frequency Monthy    PT Duration Other (comment)   6   PT Treatment/Interventions  Neuromuscular re-education;Patient/family education;Therapeutic exercise;Scar mobilization;Taping;Manual techniques;Moist Heat;Therapeutic activities;Gait training;Balance training    Consulted and Agree with Plan of Care Patient             Patient will benefit from skilled therapeutic intervention in order to improve the following deficits and impairments:  Abnormal gait, Decreased coordination, Decreased range of motion, Difficulty walking, Decreased endurance, Decreased safety awareness, Increased muscle spasms, Decreased balance, Decreased strength, Decreased mobility, Postural dysfunction, Improper body mechanics, Decreased scar mobility, Decreased activity tolerance, Increased fascial restricitons, Hypomobility  Visit Diagnosis: Sacrococcygeal disorders, not elsewhere classified  Pelvic pain  Urinary frequency  Other abnormalities of gait and mobility  Chronic bilateral low back pain with left-sided sciatica  Abnormal posture     Problem List Patient Active Problem List   Diagnosis Date Noted   Excessive sleepiness 01/30/2021   Urinary frequency 12/23/2019   Pelvic pain 12/23/2019   Low back pain 12/22/2019    Jerl Mina, PT 08/18/2021, 10:42 AM  [place] MAIN [facility] SERVICES [place], [place], [address] Phone: [phone]   Fax:  [phone]  Name: Alec Reynolds MRN: [id number] Date of Birth: [birth year]

## 2021-08-24 ENCOUNTER — Ambulatory Visit: Payer: 59 | Admitting: Physical Therapy

## 2021-08-28 ENCOUNTER — Encounter: Payer: Self-pay | Admitting: Physical Therapy

## 2021-08-31 ENCOUNTER — Other Ambulatory Visit: Payer: Self-pay

## 2021-08-31 ENCOUNTER — Ambulatory Visit: Payer: 59 | Attending: Physician Assistant | Admitting: Physical Therapy

## 2021-08-31 DIAGNOSIS — M4125 Other idiopathic scoliosis, thoracolumbar region: Secondary | ICD-10-CM | POA: Diagnosis not present

## 2021-08-31 DIAGNOSIS — R293 Abnormal posture: Secondary | ICD-10-CM | POA: Diagnosis not present

## 2021-08-31 DIAGNOSIS — R102 Pelvic and perineal pain: Secondary | ICD-10-CM | POA: Insufficient documentation

## 2021-08-31 DIAGNOSIS — R2689 Other abnormalities of gait and mobility: Secondary | ICD-10-CM | POA: Insufficient documentation

## 2021-08-31 DIAGNOSIS — M533 Sacrococcygeal disorders, not elsewhere classified: Secondary | ICD-10-CM | POA: Insufficient documentation

## 2021-08-31 DIAGNOSIS — M5442 Lumbago with sciatica, left side: Secondary | ICD-10-CM | POA: Diagnosis not present

## 2021-08-31 DIAGNOSIS — G8929 Other chronic pain: Secondary | ICD-10-CM | POA: Diagnosis not present

## 2021-08-31 DIAGNOSIS — R350 Frequency of micturition: Secondary | ICD-10-CM | POA: Diagnosis not present

## 2021-08-31 NOTE — Patient Instructions (Signed)
? ? ?  Mermaid stretch  ?Rocking while seated on the floor with heels to one side of the hip ?Heels to one side of the hip  ?Rock forward towards the knee that is bent , rock beck towards the opposite sitting bones ? ?Place  firm pilow under one buttock ( see your video) and lean to side ? ? ?___ ? ?Supported childs pose to stretch pelvic floor  ?

## 2021-08-31 NOTE — Therapy (Signed)
[place] MAIN [facility] SERVICES [address] [place], [place], [address] Phone: [phone]   Fax:  [phone]  Physical Therapy Treatment  Patient Details  Name: Alec Reynolds MRN: [id number] Date of Birth: [birth year] No data recorded  Encounter Date: 08/31/2021   PT End of Session - 08/31/21 1317     Visit Number 48    Date for PT Re-Evaluation 11/09/21   PN 05/09/20, 07/21/20, 12/29/20, 04/20/21,   Authorization Type 4/ 35 visits of PT./OT/ chiro per year    PT Start Time 1310    PT Stop Time 1400    PT Time Calculation (min) 50 min    Activity Tolerance Patient tolerated treatment well;No increased pain    Behavior During Therapy [facility] for tasks assessed/performed             No past medical history on file.  Past Surgical History:  Procedure Laterality Date   ELBOW SURGERY Left    WRIST SURGERY      There were no vitals filed for this visit.   Subjective Assessment - 08/31/21 1314     Subjective Pt feels he is doing better mentally. Pt went swimming and did the exercises which did not cause more pain. Pt has noticed more urgency and pain in the past 3-4 days but he doe snot know why. Pt noticed less pain at his glut.    Pertinent History R groin injury 2017R ,  anxiety, stress Fx in R shin, low back as kid. 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 Pt also enjoyed cycling and stopped when the pain started. Pt used to cycle 2-3 hours 200 miles / day. Pt did not have a stretch routine. Pt completed Pelvic PT via telehealth one year and tried stretching but it made it worse.    Patient Stated Goals sit down for 2 hours  without hurting, Not  have to feel like he has to pee all the time                [facility] PT Assessment - 08/31/21 1740       Observation/Other Assessments   Observations plantar arch frrm floor to naivular, R 1.5 cm, L 2 cm      AROM    Overall AROM Comments dificulty with mermaid stretch, required support under gluts due to limited hip ROM and cued for sideflexion for less pain      Palpation   Palpation comment tightness ./ tenderness at L ischial rami mm attachments/ adductors and hamstrings, transverse perineal mm                           [facility] Adult PT Treatment/Exercise - 08/31/21 1742       Therapeutic Activites    Other Therapeutic Activities reassessed goals      Neuro Re-ed    Neuro Re-ed Details  cued for support under buttocks for les [place] in mermaid stretch      Manual Therapy   Manual therapy comments STM/MWM at problem areas noted in assesment to promote pelvic floor and hip mobility                          PT Long Term Goals - 08/31/21 1319       PT LONG TERM GOAL #1  Title Pt will demo decreased abdominal separation from 4 fingers width  to < 1 fingers width to improve IAP system for pelvic floor function    Time 4    Period Weeks    Status Achieved      PT LONG TERM GOAL #2   Title Pt will demo improved posture with more anterior tilt of pelvis, increased lumbar lordosis, and no cues for proper sitting/ standing posture  in order to minimize CLBP and return to more functional activities    Time 6    Period Weeks    Status Achieved      PT LONG TERM GOAL #3   Title Pt will demo no spinal deviation and more levelled pelvic girdle across 2 visits in order to advance towards deep core/ thoracolumbar strengthening exercises    Time 2    Period Weeks    Status Achieved      PT LONG TERM GOAL #4   Title Pt will demo decreased R pelvic floor mm tightness across 2 visits in order to minimize pelvic pain and straining with bowel movements and optimize IAP system properly    Time 8    Period Weeks    Status Achieved      PT LONG TERM GOAL #5   Title Pt's FOTO score for Pain to decrease from 29pts to < 24pts and Urinary from 38 pts to < 30 pts and Bowel from  17 pts to < 12 pts and  Bowel Leakage improve 59 pts to > 64 pts    Baseline 11/8:Urinary  38 pts to 33 pts, PFDI Bowel 17pts to 4pts, bowel Leakage 59pts to 72 pts   07/21/20:    Time 10    Period Weeks    Status Partially Met      PT LONG TERM GOAL #6   Title Pt will report no urge to urinate when sitting or bending forward to put on his back pack  in order to attend meetings or leave the house    Time 8    Period Weeks    Status On-going      PT LONG TERM GOAL #7   Title Pt 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    Time 10    Period Weeks    Status Achieved      PT LONG TERM GOAL  #10   TITLE Pt will be able to hike for 40mles on low grade trail and/or 2 miles on a trail with inclines and report no pain .    Time 6    Period Months    Status On-going    Target Date 10/12/21      PT LONG TERM GOAL  #  11   TITLE Pt will maintain 2 cm of plantar arches and and no tightness at L ITband/ around GT and lateral leg, hypomobility at lateral foot with compliance with modification with tossing tennis balls repeated at work    Time 10    Period Weeks    Status On-going    Target Date 10/12/21      PT LONG TERM GOAL  #12   TITLE Pt will report decreased urgency with incline walking in order to hike with family    Time 10    Period Weeks    Status New    Target Date 11/09/21                   Plan - 08/31/21 1740      Clinical Impression Statement Pt has achieved 6/12 goals and is progressing well towards remaining goals. Pt has less urgency while standing and is requiring more skilled PT to address urgency that occurs with sitting and incline walking/ hiking. Pt's foot arches have become stronger and no longer collapsed and toe abduction has improved which has helped him with longer standing endurance with less load on pelvic floor.  Pt has been compliant with stretches and hamstrings have flexibility have improved.  Pelvic floor/ adductor tightness is still being addressed to minimize pelvic pain. Pt also has modified repeated rotational task at work ( tossing tennis ball) to minimize L hip IR / adduction which contributed to his fallen arch in foot and tight pelvic floor mm.  Anticipate this modification will help maintain better alignment of spine/ hips/ pelvis/ lower chain.   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 Pt feels he is in a better mentally. Swimming and pool exercises have been a positive method for getting a more well-rounded fitness program for him as he has been enjoying it and it causes no worsening of his Sx. Plan to continue with more hip mobility HEP and progress to resistance band strengthening.  Pt continues to benefit form skilled PT to achieve remaining goals.    Examination-Activity Limitations Toileting;Sit    Stability/Clinical Decision Making Evolving/Moderate complexity    Rehab Potential Good    PT Frequency Monthy    PT Duration Other (comment)   6   PT Treatment/Interventions Neuromuscular re-education;Patient/family education;Therapeutic exercise;Scar mobilization;Taping;Manual techniques;Moist Heat;Therapeutic activities;Gait training;Balance training    Consulted and Agree with Plan of Care Patient             Patient will benefit from skilled therapeutic intervention in order to improve the following deficits and  impairments:  Abnormal gait, Decreased coordination, Decreased range of motion, Difficulty walking, Decreased endurance, Decreased safety awareness, Increased muscle spasms, Decreased balance, Decreased strength, Decreased mobility, Postural dysfunction, Improper body mechanics, Decreased scar mobility, Decreased activity tolerance, Increased fascial restricitons, Hypomobility  Visit Diagnosis: Sacrococcygeal disorders, not elsewhere classified  Pelvic pain  Other abnormalities of gait and mobility  Urinary frequency  Chronic bilateral low back pain with left-sided sciatica  Other idiopathic scoliosis, thoracolumbar region  Abnormal posture     Problem List Patient Active Problem List   Diagnosis Date Noted   Excessive sleepiness 01/30/2021   Urinary frequency 12/23/2019   Pelvic pain 12/23/2019   Low back pain 12/22/2019    Jerl Mina, PT 08/31/2021, 5:45 PM  [place] MAIN [facility] SERVICES [address] [place], [place], [address] Phone: [phone]   Fax:  [phone]  Name:  Alec Reynolds MRN: [id number] Date of Birth: [birth year]

## 2021-09-12 ENCOUNTER — Encounter: Payer: Self-pay | Admitting: Physical Therapy

## 2021-09-14 ENCOUNTER — Ambulatory Visit: Payer: 59 | Admitting: Physical Therapy

## 2021-09-25 ENCOUNTER — Encounter: Payer: Self-pay | Admitting: Physical Therapy

## 2021-10-10 ENCOUNTER — Encounter: Payer: Self-pay | Admitting: Physical Therapy

## 2021-10-12 ENCOUNTER — Ambulatory Visit: Payer: 59 | Attending: Physician Assistant | Admitting: Physical Therapy

## 2021-10-12 DIAGNOSIS — R102 Pelvic and perineal pain: Secondary | ICD-10-CM | POA: Diagnosis not present

## 2021-10-12 DIAGNOSIS — M533 Sacrococcygeal disorders, not elsewhere classified: Secondary | ICD-10-CM | POA: Diagnosis not present

## 2021-10-12 DIAGNOSIS — R350 Frequency of micturition: Secondary | ICD-10-CM

## 2021-10-12 DIAGNOSIS — R293 Abnormal posture: Secondary | ICD-10-CM

## 2021-10-12 DIAGNOSIS — G8929 Other chronic pain: Secondary | ICD-10-CM

## 2021-10-12 DIAGNOSIS — R2689 Other abnormalities of gait and mobility: Secondary | ICD-10-CM | POA: Diagnosis not present

## 2021-10-12 DIAGNOSIS — M5442 Lumbago with sciatica, left side: Secondary | ICD-10-CM | POA: Insufficient documentation

## 2021-10-12 DIAGNOSIS — M4125 Other idiopathic scoliosis, thoracolumbar region: Secondary | ICD-10-CM

## 2021-10-13 NOTE — Therapy (Signed)
[place] [facility] ?[place], [place], [address] ?Phone: [phone]   Fax:  [phone] ? ?Physical Therapy Treatment ? ?Patient Details  ?Name: Alec Reynolds ?MRN: [id number] ?Date of Birth: 11/01/1970 ?No data recorded ? ?Encounter Date: 10/12/2021 ? ? PT End of Session - 10/12/21 1309   ? ? Visit Number 20   ? Date for PT Re-Evaluation 11/09/21   PN 05/09/20, 07/21/20, 12/29/20, 04/20/21,  ? Authorization Type 5/ 35 visits of PT./OT/ chiro per year   ? PT Start Time 1304   ? PT Stop Time 1400   ? PT Time Calculation (min) 56 min   ? Activity Tolerance Patient tolerated treatment well;No increased pain   ? Behavior During Therapy [facility] for tasks assessed/performed   ? ?  ?  ? ?  ? ? ?No past medical history on file. ? ?Past Surgical History:  ?Procedure Laterality Date  ? ELBOW SURGERY Left   ? WRIST SURGERY    ? ? ?There were no vitals filed for this visit. ? ? Subjective Assessment - 10/12/21 1309   ? ? Subjective Pt went to [place] to visit his Dad. Pt was a nervous wreck with the plane ride because his Sx had started when he returned from [place] on a flight 51 years old. Pt sat around a lot on different surfaces and different types of transportation. Pt had pain but he got through his nervousness with plane ride and with sitting. "I felt ok. I made it."  Pt felt calm while on this trip as [place] and not as "[place] [employer]".   ? Pertinent History R groin injury 2017R ,  anxiety, stress Fx in R shin, low back as kid. 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 Pt also enjoyed cycling and stopped when the pain started. Pt used to cycle 2-3 hours 200 miles / day. Pt did not have a stretch routine. Pt completed Pelvic PT via telehealth one year and tried stretching but it made it worse.   ? ?  ?  ? ?  ? ? ? ? ? [facility] PT Assessment - 10/13/21 1142   ? ?  ? Observation/Other  Assessments  ? Observations 2 cm height of medial arches B   ? ?  ?  ? ?  ? ? ? ? ? ? ? ? ? ? ? ? ? Pelvic Floor Special Questions - 10/13/21 1143   ? ? External Palpation through clothing: increased tenderness / tightness along ischiocavernosus at 2 o'clock on L and at L SIJ   ? ?  ?  ? ?  ? ? ? ? [facility] Adult PT Treatment/Exercise - 10/13/21 1144   ? ?  ? Therapeutic Activites   ? Other Therapeutic Activities reassessed goals. active listening to pt overcoming major milestone ( traveling to [place] on plane, motivational interviewing with strategies he used IND to overcome his stress with urinary and pain Sx with sitting on plane which was how pt's initial onset of Sx occured in 2019)   ?  ? Neuro Re-ed   ? Neuro Re-ed Details  cued for relaxation practice with breathing that can eb done in seated position   ? ?  ?  ? ?  ? ? ? ? ? ? ? ? ? ? ? ? ? ? ? PT Long Term Goals - 10/12/21 1333   ? ?  ?  PT LONG TERM GOAL #1  ? Title Pt will demo decreased abdominal separation from 4 fingers width  to < 1 fingers width to improve IAP system for pelvic floor function   ? Time 4   ? Period Weeks   ? Status Achieved   ?  ? PT LONG TERM GOAL #2  ? Title Pt will demo improved posture with more anterior tilt of pelvis, increased lumbar lordosis, and no cues for proper sitting/ standing posture  in order to minimize CLBP and return to more functional activities   ? Time 6   ? Period Weeks   ? Status Achieved   ?  ? PT LONG TERM GOAL #3  ? Title Pt will demo no spinal deviation and more levelled pelvic girdle across 2 visits in order to advance towards deep core/ thoracolumbar strengthening exercises   ? Time 2   ? Period Weeks   ? Status Achieved   ?  ? PT LONG TERM GOAL #4  ? Title Pt will demo decreased R pelvic floor mm tightness across 2 visits in order to minimize pelvic pain and straining with bowel movements and optimize IAP system properly   ? Time 8   ? Period Weeks   ? Status Achieved   ?  ? PT LONG TERM GOAL #5  ? Title  Pt's FOTO score for Pain to decrease from 29pts to < 24pts and Urinary from 38 pts to < 30 pts and Bowel from 17 pts to < 12 pts and  Bowel Leakage improve 59 pts to > 64 pts   ? Baseline 11/8:Urinary  38 pts to 33 pts, PFDI Bowel 17pts to 4pts, bowel Leakage 59pts to 72 pts   07/21/20:   ? Time 10   ? Period Weeks   ? Status Partially Met   ?  ? PT LONG TERM GOAL #6  ? Title Pt will report no urge to urinate when sitting or bending forward to put on his back pack  in order to attend meetings or leave the house   ? Baseline 10/12/21: 50% improvement)   ? Time 8   ? Period Weeks   ? Status Partially Met   ?  ? PT LONG TERM GOAL #7  ? 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 Time 8   ? Period Weeks   ? Status Achieved   ? Target Date 08/31/21   ?  ? PT LONG TERM GOAL #8  ? 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 Baseline 08/31/21: no urgency when standing anymore, but urgency with sitting   ? Time 10   ? Period Weeks   ? Status Achieved   ? Target Date 11/09/21   ?  ? PT LONG TERM GOAL  #9  ? TITLE Pt will follow up with PCP about sleep study given nocturia for the past 4 years.   ? Time 10   ? Period Weeks   ? Status Achieved   ?  ? PT LONG TERM GOAL  #10  ? TITLE Pt will be able to hike for 8mles on low grade trail and/or 2 miles on a trail with inclines and report no pain .   ? Time 6   ? Period Months   ? Status Partially  Met   ? Target Date 10/12/21   ?  ? PT LONG TERM GOAL  #11  ? TITLE Pt will maintain 2 cm of plantar arches and and no tightness at L ITband/ around GT and lateral leg, hypomobility at lateral foot with compliance with modification with tossing tennis balls repeated at work   ? Baseline (10/12/21:  2cm B)   ? Time 10   ? Period Weeks   ? Status Achieved   ? Target Date 10/12/21   ?  ? PT LONG TERM GOAL  #12  ? TITLE Pt will report decreased  urgency with incline walking in order to hike with family   ? Time 10   ? Period Weeks   ? Status On-going   ? Target Date 11/09/21   ?  ? PT LONG TERM GOAL  #13  ? TITLE Pt will demo no tensions nor tenderness at ischiocavernosus on L ( 2 o'clock position_) and at L ischial tuberosity across 2 visits   ? Status New   ? Target Date 12/21/21   ? ?  ?  ? ?  ? ? ? ? ? ? ? ? Plan - 10/13/21 1141   ? ? Clinical Impression Statement Pt is showing more IND with self-management of his urinary frequency and pelvic pain Sx. Pt achieved a major milestone with overcoming a fearful activity which is to travel to [place] to see his dad because his original Sx started in 2019 when he was traveling back from [place].  ? ?Active listening was provided to pt. Motivational interviewing was provided to inquire what strategies he used IND to overcome his stress with urinary and pain Sx with breathing techniques and positive self-talk. Inquired about  his future vision for travels and he wishes to travel with wife in RV to nature parks.  ? ?Pt showed L digit II no longer overlapping big toe, 2 cm height of medial arches B. Smaller surface area of mm tensions , now only present at pinpointed location 2 o'clock at ischiocavernosus L   and ischial tuberosity L. Plan to assess these areas and treat at next session. ? ?Pt learned a new relaxation technique that he can do in seated position so it can be applied in community settings to help pt able to regulate his nervous system when engaging in more community events.  ? ?Pt continues to benefit form skilled PT ?  ? Examination-Activity Limitations Toileting;Sit   ? Stability/Clinical Decision Making Evolving/Moderate complexity   ? Rehab Potential Good   ? PT Frequency Monthy   ? PT Duration Other (comment)   6  ? PT Treatment/Interventions Neuromuscular re-education;Patient/family education;Therapeutic exercise;Scar mobilization;Taping;Manual techniques;Moist Heat;Therapeutic activities;Gait  training;Balance training   ? Consulted and Agree with Plan of Care Patient   ? ?  ?  ? ?  ? ? ?Patient will benefit from skilled therapeutic intervention in order to improve the following deficits and impairme

## 2021-10-23 ENCOUNTER — Encounter: Payer: Self-pay | Admitting: Physical Therapy

## 2021-11-09 ENCOUNTER — Ambulatory Visit: Payer: 59 | Attending: Physician Assistant | Admitting: Physical Therapy

## 2021-11-09 DIAGNOSIS — M533 Sacrococcygeal disorders, not elsewhere classified: Secondary | ICD-10-CM | POA: Diagnosis not present

## 2021-11-09 DIAGNOSIS — R293 Abnormal posture: Secondary | ICD-10-CM | POA: Insufficient documentation

## 2021-11-09 DIAGNOSIS — R2689 Other abnormalities of gait and mobility: Secondary | ICD-10-CM | POA: Insufficient documentation

## 2021-11-09 DIAGNOSIS — M5442 Lumbago with sciatica, left side: Secondary | ICD-10-CM | POA: Insufficient documentation

## 2021-11-09 DIAGNOSIS — G8929 Other chronic pain: Secondary | ICD-10-CM | POA: Insufficient documentation

## 2021-11-09 DIAGNOSIS — R350 Frequency of micturition: Secondary | ICD-10-CM | POA: Diagnosis not present

## 2021-11-09 DIAGNOSIS — R102 Pelvic and perineal pain: Secondary | ICD-10-CM | POA: Insufficient documentation

## 2021-11-09 DIAGNOSIS — M4125 Other idiopathic scoliosis, thoracolumbar region: Secondary | ICD-10-CM | POA: Diagnosis not present

## 2021-11-09 NOTE — Patient Instructions (Signed)
[place]  ? ?

## 2021-11-10 NOTE — Therapy (Signed)
[place] [facility] ?[place], [place], [address] ?Phone: [phone]   Fax:  [phone] ? ?Physical Therapy Treatment / Progress Note across 10 visits from Visit 40 to 50 (04/20/21 to 11/09/21) ? ? ?Patient Details  ?Name: Alec Reynolds ?MRN: [id number] ?Date of Birth: Dec 24, 1970 ?No data recorded ? ?Encounter Date: 11/09/2021 ? ? PT End of Session - 11/09/21 1329   ? ? Visit Number 50   ? Date for PT Re-Evaluation 01/18/22   PN 05/09/20, 07/21/20, 12/29/20, 04/20/21,  ? Authorization Type 6/ 35 visits of PT./OT/ chiro per year   ? PT Start Time 3500   ? PT Stop Time 1420   ? PT Time Calculation (min) 63 min   ? Activity Tolerance Patient tolerated treatment well;No increased pain   ? Behavior During Therapy [facility] for tasks assessed/performed   ? ?  ?  ? ?  ? ? ?No past medical history on file. ? ?Past Surgical History:  ?Procedure Laterality Date  ? ELBOW SURGERY Left   ? WRIST SURGERY    ? ? ?There were no vitals filed for this visit. ? ? Subjective Assessment - 11/09/21 1322   ? ? Subjective Pt has been using therawand to release pelvic floor as recommended by Dr. Rocky Link. Pt notices one area on the L that is more tight. Pain at L glut is better and it comes and goes. Pt will not use a lacross ball anymore. The more he stretches his hamstring, he feels less pain and it is more comfortable to sit. pain with erections is better and pain does not last as long and not impact his day. pt noticed the only Sx that has moved backward is constipation since he returned from [place]. Pt feels the urge to have BM but he can not. Pt has been gasey.   ? Pertinent History R groin injury 2017R ,  anxiety, stress Fx in R shin, low back as kid. 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 Pt also enjoyed cycling and stopped when the pain started. Pt used to cycle 2-3 hours 200 miles / day. Pt did not have a  stretch routine. Pt completed Pelvic PT via telehealth one year and tried stretching but it made it worse.   ? ?  ?  ? ?  ? ? ? ? ? ? ? ? ? ? ? ? ? ? ? ? ? Pelvic Floor Special Questions - 11/10/21 0921   ? ? Pelvic Floor Internal Exam pt consented verbally and had no contraindications   ? Exam Type Rectal   ? Palpation tightness 5-9 o'clock at ischio/iliococcygeus mm L, less cues required for anterior tilt of pelvis   ? ?  ?  ? ?  ? ? ? ? [facility] Adult PT Treatment/Exercise - 11/10/21 0922   ? ?  ? Therapeutic Activites   ? Other Therapeutic Activities discussed hiking goals, made recommendations for health and safety on hikes   ?  ? Manual Therapy  ? Manual therapy comments STM/MWM at problem areas noted in assesment to promote pelvic floor mobility and decrease pain   ? ?  ?  ? ?  ? ? ? ? ? ? ? ? ? ? ? ? ? ? ? PT Long Term Goals - 11/09/21 1332   ? ?  ? PT LONG TERM GOAL #1  ? Title  Pt will demo decreased abdominal separation from 4 fingers width  to < 1 fingers width to improve IAP system for pelvic floor function   ? Time 4   ? Period Weeks   ? Status Achieved   ?  ? PT LONG TERM GOAL #2  ? Title Pt will demo improved posture with more anterior tilt of pelvis, increased lumbar lordosis, and no cues for proper sitting/ standing posture  in order to minimize CLBP and return to more functional activities   ? Time 6   ? Period Weeks   ? Status Achieved   ?  ? PT LONG TERM GOAL #3  ? Title Pt will demo no spinal deviation and more levelled pelvic girdle across 2 visits in order to advance towards deep core/ thoracolumbar strengthening exercises   ? Time 2   ? Period Weeks   ? Status Achieved   ?  ? PT LONG TERM GOAL #4  ? Title Pt will demo decreased R pelvic floor mm tightness across 2 visits in order to minimize pelvic pain and straining with bowel movements and optimize IAP system properly   ? Time 8   ? Period Weeks   ? Status Achieved   ?  ? PT LONG TERM GOAL #5  ? Title Pt's FOTO score for Pain to decrease from  29pts to < 24pts and Urinary from 38 pts to < 30 pts and Bowel from 17 pts to < 12 pts and  Bowel Leakage improve 59 pts to > 64 pts   ? Baseline 11/8:Urinary  38 pts to 33 pts, PFDI Bowel 17pts to 4pts, bowel Leakage 59pts to 72 pts   10/22/21 :  Pain 29pt --> 4pts,  Urinary 38 pts--> 4pts, Bowel 18 pts-->0 pts, Leakage  59 pts --> 83 pts   ? Time 10   ? Period Weeks   ? Status Achieved   ?  ? PT LONG TERM GOAL #6  ? Title Pt will report no urge to urinate when sitting or bending forward to put on his back pack  in order to attend meetings or leave the house   ? Baseline 10/12/21: 50% improvement)  11/09/21: 60% improvement   ? Time 8   ? Period Weeks   ? Status Partially Met   ?  ? PT LONG TERM GOAL #7  ? 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 Time 8   ? Period Weeks   ? Status Achieved   ? Target Date 08/31/21   ?  ? PT LONG TERM GOAL #8  ? 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 Baseline 08/31/21: no urgency when standing anymore, but urgency with sitting   ? Time 10   ? Period Weeks   ? Status Achieved   ? Target Date 11/09/21   ?  ? PT LONG TERM GOAL  #9  ? TITLE Pt will follow up with PCP about sleep study given nocturia for the past 4 years.   ? Time 10   ? Period Weeks   ? Status Achieved   ?  ? PT LONG TERM GOAL  #10  ? TITLE Pt will be able to hike for 6mles on low grade trail and/or 2 miles on a trail with inclines and report no  pain .   ? Time 6   ? Period Months   ? Status Partially Met   ? Target Date 10/12/21   ?  ? PT LONG TERM GOAL  #11  ? TITLE Pt will maintain 2 cm of plantar arches and and no tightness at L ITband/ around GT and lateral leg, hypomobility at lateral foot with compliance with modification with tossing tennis balls repeated at work   ? Baseline (10/12/21:  2cm B)   ? Time 10   ? Period Weeks   ? Status Achieved   ? Target Date  10/12/21   ?  ? PT LONG TERM GOAL  #12  ? TITLE Pt will report decreased urgency with incline walking in order to hike with family   ? Time 10   ? Period Weeks   ? Status On-going   ? Target Date 11/09/21   ?  ? PT LONG TERM GOAL  #13  ? TITLE Pt will demo no tensions nor tenderness at ischiocavernosus on L ( 2 o'clock position_) and at L ischial tuberosity across 2 visits   ? Status New   ? Target Date 12/21/21   ? ?  ?  ? ?  ? ? ? ? ? ? ? ? Plan - 11/09/21 1330   ? ? Clinical Impression Statement Pt has achieved 10/13 goals and progressing well towards remaining goals. 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 Pt is IND with lower kinetic chain stretches.  ? ?Pt has been working with a Leisure centre manager which has been very helpful for pt. Pt achieved a milestone improvement as pt was able to travel to [place] to visit his dad , ride on all modes of transportation and was able to manage pain and urgency. Pt reports he feels he is going better mentally and is still working with his psychotherapist.  ? ?The more he stretches his hamstring, he feels less pain and it is more comfortable to sit. Pain with erections is better and pain does not last as long and not impact his day. Pt noticed the only Sx that has moved backward is constipation since he returned from [place]. Pt feels the urge to have BM but he can not. Pt has been gasey.  ? ?Today, assessed with rectal exam and pt; demo'd tightness at L 5-7 o'clock position which are the exact areas of pain her he still notices. Pt demo'd imrpoved anterior tilt of pelvic and ability to relax pelvic floor during exam and Tx. Plan to continue addressing this area and assess hamstring / adductor attachments. Anticipate improvements in this area will help with his c/o bowel Sx.  Discussed small steps to achieve remaining goals related to hiking on inclines.  ? ? ?Pt continues  to benefit from skilled PT.  ? Examination-Activity Limitations Toileting;Sit   ? Stability/Clinical Decision Making Evolving/Moderate complexity   ? Rehab Potential Good   ? PT Frequency Monthy   ? PT

## 2021-11-23 ENCOUNTER — Ambulatory Visit: Payer: 59 | Admitting: Physical Therapy

## 2021-11-23 DIAGNOSIS — M4125 Other idiopathic scoliosis, thoracolumbar region: Secondary | ICD-10-CM | POA: Diagnosis not present

## 2021-11-23 DIAGNOSIS — R102 Pelvic and perineal pain: Secondary | ICD-10-CM | POA: Diagnosis not present

## 2021-11-23 DIAGNOSIS — R350 Frequency of micturition: Secondary | ICD-10-CM | POA: Diagnosis not present

## 2021-11-23 DIAGNOSIS — M533 Sacrococcygeal disorders, not elsewhere classified: Secondary | ICD-10-CM | POA: Diagnosis not present

## 2021-11-23 DIAGNOSIS — R2689 Other abnormalities of gait and mobility: Secondary | ICD-10-CM

## 2021-11-23 DIAGNOSIS — G8929 Other chronic pain: Secondary | ICD-10-CM | POA: Diagnosis not present

## 2021-11-23 DIAGNOSIS — R293 Abnormal posture: Secondary | ICD-10-CM | POA: Diagnosis not present

## 2021-11-23 DIAGNOSIS — M5442 Lumbago with sciatica, left side: Secondary | ICD-10-CM | POA: Diagnosis not present

## 2021-11-23 NOTE — Therapy (Signed)
[place] MAIN [facility] SERVICES [address] [place], [place], [address] Phone: [phone]   Fax:  [phone]  Physical Therapy Treatment  Patient Details  Name: Alec Reynolds MRN: [id number] Date of Birth: 18-Nov-1970 No data recorded  Encounter Date: 11/23/2021   PT End of Session - 11/23/21 1549     Visit Number 42    Date for PT Re-Evaluation 01/18/22   PN 05/09/20, 07/21/20, 12/29/20, 04/20/21, 11/09/21   Authorization Type 7/ 35 visits of PT./OT/ chiro per year    PT Start Time 1302    PT Stop Time 1400    PT Time Calculation (min) 58 min    Activity Tolerance Patient tolerated treatment well;No increased pain    Behavior During Therapy [facility] for tasks assessed/performed             No past medical history on file.  Past Surgical History:  Procedure Laterality Date   ELBOW SURGERY Left    WRIST SURGERY      There were no vitals filed for this visit.   Subjective Assessment - 11/23/21 1310     Subjective Pt has hiked 2.4 miles, flat trail,.Pt had the urge to pee at the end and noticed the pain at the specific spot at the L side of pelvic floor. After last session, pt noticed increased urinary urgency and pain but then on the 3rd day after the session, pt felt amazing and the Sx were dialed 60%. Gradually the Sx has gradually increased again.    Pertinent History R groin injury 2017R ,  anxiety, stress Fx in R shin, low back as kid. 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 Pt also enjoyed cycling and stopped when the pain started. Pt used to cycle 2-3 hours 200 miles / day. Pt did not have a stretch routine. Pt completed Pelvic PT via telehealth one year and tried stretching but it made it worse.                            Pelvic Floor Special Questions - 11/23/21 1549     Pelvic Floor Internal Exam pt consented verbally and had no  contraindications    Exam Type Rectal    Palpation tightness 7-11  o'clock with tenderness and tightness, cued for anterior tilt of pelvis               [facility] Adult PT Treatment/Exercise - 11/23/21 1551       Therapeutic Activites    Other Therapeutic Activities explained the role of brain retraining on pain with cross midline strategies, use of non dominant hand activities to utilize L brain as pt is L hand dominant,      Neuro Re-ed    Neuro Re-ed Details  cued for pain science reeducation, mindifulness practice with self care practices, cross midline practices      Modalities   Modalities Moist Heat      Moist Heat Therapy   Number Minutes Moist Heat 5 Minutes    Moist Heat Location --   perineum, lower trunk rotation to ext L hip, lengthen pelvic floor     Manual Therapy   Manual therapy comments STM/MWM at problem areas noted in assesment to promote pelvic floor mobility and decrease pain  PT Long Term Goals - 11/09/21 1332       PT LONG TERM GOAL #1   Title Pt will demo decreased abdominal separation from 4 fingers width  to < 1 fingers width to improve IAP system for pelvic floor function    Time 4    Period Weeks    Status Achieved      PT LONG TERM GOAL #2   Title Pt will demo improved posture with more anterior tilt of pelvis, increased lumbar lordosis, and no cues for proper sitting/ standing posture  in order to minimize CLBP and return to more functional activities    Time 6    Period Weeks    Status Achieved      PT LONG TERM GOAL #3   Title Pt will demo no spinal deviation and more levelled pelvic girdle across 2 visits in order to advance towards deep core/ thoracolumbar strengthening exercises    Time 2    Period Weeks    Status Achieved      PT LONG TERM GOAL #4   Title Pt will demo decreased R pelvic floor mm tightness across 2 visits in order to minimize pelvic pain and straining with bowel movements and  optimize IAP system properly    Time 8    Period Weeks    Status Achieved      PT LONG TERM GOAL #5   Title Pt's FOTO score for Pain to decrease from 29pts to < 24pts and Urinary from 38 pts to < 30 pts and Bowel from 17 pts to < 12 pts and  Bowel Leakage improve 59 pts to > 64 pts    Baseline 11/8:Urinary  38 pts to 33 pts, PFDI Bowel 17pts to 4pts, bowel Leakage 59pts to 72 pts   10/22/21 :  Pain 29pt --> 4pts,  Urinary 38 pts--> 4pts, Bowel 18 pts-->0 pts, Leakage  59 pts --> 83 pts    Time 10    Period Weeks    Status Achieved      PT LONG TERM GOAL #6   Title Pt will report no urge to urinate when sitting or bending forward to put on his back pack  in order to attend meetings or leave the house    Baseline 10/12/21: 50% improvement)  11/09/21: 60% improvement    Time 8    Period Weeks    Status Partially Met      PT LONG TERM GOAL #7   Title Pt 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    Time 10    Period Weeks    Status Achieved      PT LONG TERM GOAL  #10   TITLE Pt will  be able to hike for 52mles on low grade trail and/or 2 miles on a trail with inclines and report no pain .    Time 6    Period Months    Status Partially Met    Target Date 10/12/21      PT LONG TERM GOAL  #11   TITLE Pt will maintain 2 cm of plantar arches and and no tightness at L ITband/ around GT and lateral leg, hypomobility at lateral foot with compliance  with modification with tossing tennis balls repeated at work    Baseline (10/12/21:  2cm B)    Time 10    Period Weeks    Status Achieved    Target Date 10/12/21      PT LONG TERM GOAL  #12   TITLE Pt will report decreased urgency with incline walking in order to hike with family    Time 10    Period Weeks    Status On-going    Target Date 11/09/21      PT LONG TERM GOAL  #13   TITLE Pt will demo no tensions nor tenderness at ischiocavernosus on L ( 2 o'clock position_) and at L ischial tuberosity across 2 visits    Status New    Target Date 12/21/21                   Plan - 11/23/21 1549     Clinical Impression Statement Pt had positive response to rectal manual Tx with one day of good relief 2 days after slightly increased pain after session. Pt also started hiking 2.4 mi trail which pt has as a goal for hiking.    Pt demo'd decreased pelvic floor mm tightness. Localized point of tightness and tenderness remains on L side and required fascial releases along ischial rami region. Provided pain science education and plan to try mirror therapy next session. Advised to practice activities using non-dominant hand ( R) to activate L brain and create new neuroplasticity beyond pain response. Advised pt to apply mindfulness with use of therawand as part of pain retraining practice.   Pt reported he noticed deviated urine stream to L after toileting immediate to manual Tx at the end of session. Plan to continue to reassess pelvic floor tightness on L at upcoming sessions. Pt continues to benefit from skilled PT    Examination-Activity Limitations Toileting;Sit    Stability/Clinical Decision Making Evolving/Moderate complexity    Rehab Potential Good    PT Frequency Monthy    PT Duration Other (comment)   6   PT Treatment/Interventions Neuromuscular re-education;Patient/family education;Therapeutic exercise;Scar mobilization;Taping;Manual techniques;Moist Heat;Therapeutic  activities;Gait training;Balance training    Consulted and Agree with Plan of Care Patient             Patient will benefit from skilled therapeutic intervention in order to improve the following deficits and impairments:  Abnormal gait, Decreased coordination, Decreased range of motion, Difficulty walking, Decreased endurance, Decreased safety awareness, Increased muscle spasms, Decreased balance, Decreased strength, Decreased mobility, Postural dysfunction, Improper body mechanics, Decreased scar mobility, Decreased activity tolerance, Increased fascial restricitons, Hypomobility  Visit Diagnosis: Sacrococcygeal disorders, not elsewhere classified  Pelvic pain  Other abnormalities of gait and mobility     Problem List Patient Active Problem List   Diagnosis Date Noted   Excessive sleepiness 01/30/2021   Urinary frequency 12/23/2019   Pelvic pain 12/23/2019   Low back pain 12/22/2019    YJerl Mina PT 11/23/2021, 5:37 PM  [place]  [facility] MAIN [facility] SERVICES [place], [place], [address] Phone: [phone]   Fax:  [phone]  Name: Alec Reynolds MRN: [id number] Date of Birth: [birth year]

## 2021-11-23 NOTE — Patient Instructions (Signed)
Pain science education: cross midline strategies, use of non dominant hand activities to utilize L brain as pt is L hand dominant  Find anterior tilt of pelvic with deep core

## 2021-12-07 ENCOUNTER — Ambulatory Visit: Payer: 59 | Attending: Physician Assistant | Admitting: Physical Therapy

## 2021-12-07 DIAGNOSIS — R350 Frequency of micturition: Secondary | ICD-10-CM | POA: Diagnosis not present

## 2021-12-07 DIAGNOSIS — R2689 Other abnormalities of gait and mobility: Secondary | ICD-10-CM | POA: Insufficient documentation

## 2021-12-07 DIAGNOSIS — M533 Sacrococcygeal disorders, not elsewhere classified: Secondary | ICD-10-CM | POA: Insufficient documentation

## 2021-12-07 DIAGNOSIS — M5442 Lumbago with sciatica, left side: Secondary | ICD-10-CM | POA: Diagnosis not present

## 2021-12-07 DIAGNOSIS — R102 Pelvic and perineal pain: Secondary | ICD-10-CM | POA: Diagnosis not present

## 2021-12-07 DIAGNOSIS — G8929 Other chronic pain: Secondary | ICD-10-CM | POA: Diagnosis not present

## 2021-12-07 DIAGNOSIS — R293 Abnormal posture: Secondary | ICD-10-CM | POA: Diagnosis not present

## 2021-12-07 NOTE — Patient Instructions (Signed)
Resting mermaid pose on bolster ankles to the L side

## 2021-12-07 NOTE — Therapy (Signed)
[place] MAIN [facility] SERVICES [address] [place], [place], [address] Phone: [phone]   Fax:  [phone]  Physical Therapy Treatment  Patient Details  Name: Alec Reynolds MRN: [id number] Date of Birth: [birth year] No data recorded  Encounter Date: 12/07/2021   PT End of Session - 12/07/21 1401     Visit Number 69    Date for PT Re-Evaluation 01/18/22   PN 05/09/20, 07/21/20, 12/29/20, 04/20/21, 11/09/21   Authorization Type 8/ 35 visits of PT./OT/ chiro per year    PT Start Time 1310    PT Stop Time 1405    PT Time Calculation (min) 55 min    Activity Tolerance Patient tolerated treatment well;No increased pain    Behavior During Therapy [facility] for tasks assessed/performed             No past medical history on file.  Past Surgical History:  Procedure Laterality Date   ELBOW SURGERY Left    WRIST SURGERY      There were no vitals filed for this visit.   Subjective Assessment - 12/07/21 1313     Subjective Pt has noticed the pain in L pelvic floor and urgency  improves with past Tx. Pt uses the therawand and notices how tight 11 o'clock area is in his pelvic floor. It takes some time to relax that area. Pt got sick with a stomach bug on Sunday and had diarrhea and had pain in upper B quadrant of  abdomen and around his low back. Pt notices  these areas hurt when he pees. Pt jas an appt with Dr. Erlene Quan , urologist because he wants to find out if he has kidney stones.    Pertinent History R groin injury 2017R ,  anxiety, stress Fx in R shin, low back as kid. 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 Pt also enjoyed cycling and stopped when the pain started. Pt used to cycle 2-3 hours 200 miles / day. Pt did not have a stretch routine. Pt completed Pelvic PT via telehealth one year and tried stretching but it made it worse.                [facility] PT Assessment -  12/07/21 1358       Palpation   SI assessment  levelled pelvic girdle    Palpation comment 1-2 oclock, tranvserse perineal L>R tightness, attachments at ischial rami L                        Pelvic Floor Special Questions - 12/07/21 1359     Pelvic Floor Internal Exam pt consented verbally and had no contraindications    Exam Type Rectal    Palpation tightness10-11 o'clock with tenderness and tightness, ischial rami/ ischiotuberosity L ,               [facility] Adult PT Treatment/Exercise - 12/07/21 1359       Therapeutic Activites    Other Therapeutic Activities explained pelvic floor and anatomy for kidneys, ureter, prostate, urethra, listened to pt about his concern about B low back and groin pain and planning to see urologist to r/o kidney stones      Neuro Re-ed    Neuro Re-ed Details  --      Modalities   Modalities Moist Heat      Moist Heat Therapy  Moist Heat Location --   perineum, thru clothing, in semi reclined upper trunk rotation, supported on bolster and pillows , L ilia in hip ext, perineal tranverse mm lengthening     Manual Therapy   Manual therapy comments STM/MWM at problem areas noted in assesment to promote pelvic floor mobility and decrease pain                          PT Long Term Goals - 12/07/21 1442       PT LONG TERM GOAL #1   Title Pt will demo decreased abdominal separation from 4 fingers width  to < 1 fingers width to improve IAP system for pelvic floor function    Time 4    Period Weeks    Status Achieved      PT LONG TERM GOAL #2   Title Pt will demo improved posture with more anterior tilt of pelvis, increased lumbar lordosis, and no cues for proper sitting/ standing posture  in order to minimize CLBP and return to more functional activities    Time 6    Period Weeks    Status Achieved      PT LONG TERM GOAL #3   Title Pt will demo no spinal deviation and more levelled pelvic girdle across 2 visits  in order to advance towards deep core/ thoracolumbar strengthening exercises    Time 2    Period Weeks    Status Achieved      PT LONG TERM GOAL #4   Title Pt will demo decreased R pelvic floor mm tightness across 2 visits in order to minimize pelvic pain and straining with bowel movements and optimize IAP system properly    Time 8    Period Weeks    Status Achieved      PT LONG TERM GOAL #5   Title Pt's FOTO score for Pain to decrease from 29pts to < 24pts and Urinary from 38 pts to < 30 pts and Bowel from 17 pts to < 12 pts and  Bowel Leakage improve 59 pts to > 64 pts    Baseline 11/8:Urinary  38 pts to 33 pts, PFDI Bowel 17pts to 4pts, bowel Leakage 59pts to 72 pts   10/22/21 :  Pain 29pt --> 4pts,  Urinary 38 pts--> 4pts, Bowel 18 pts-->0 pts, Leakage  59 pts --> 83 pts    Time 10    Period Weeks    Status Achieved      PT LONG TERM GOAL #6   Title Pt will report no urge to urinate when sitting or bending forward to put on his back pack  in order to attend meetings or leave the house    Baseline 10/12/21: 50% improvement)  11/09/21: 60% improvement    Time 8    Period Weeks    Status Partially Met      PT LONG TERM GOAL #7   Title Pt 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    Time 10    Period Weeks    Status Achieved      PT LONG TERM GOAL  #10   TITLE Pt will be able to hike for 46mles on low grade trail and/or 2 miles on a trail  with inclines and report no pain .    Time 6    Period Months    Status Partially Met    Target Date 10/12/21      PT LONG TERM GOAL  #11   TITLE Pt will maintain 2 cm of plantar arches and and no tightness at L ITband/ around GT and lateral leg, hypomobility at lateral foot with compliance with modification with tossing tennis balls repeated at work    Baseline (10/12/21:  2cm B)    Time 10    Period Weeks    Status Achieved    Target Date 10/12/21      PT LONG TERM GOAL  #12   TITLE Pt will report decreased urgency with incline walking in order to hike with family    Time 10    Period Weeks    Status On-going    Target Date 11/09/21      PT LONG TERM GOAL  #13   TITLE Pt will demo no tensions nor tenderness at ischiocavernosus on L ( 2 o'clock position_) and at L ischial tuberosity across 2 visits    Status New    Target Date 12/21/21                   Plan - 12/07/21 1401     Clinical Impression Statement Pt reports decreasing urgency and pain after rectal internal Tx to minimize tightness along ischial rami attachments on L side.   Today continued to further address remaining tightness at perineal transverse mm and ischiocavernosus and deeper mm, adductor, hamstring attachments along ischial rami/ tuberosity L. Pt reported more relaxation and less tenderness at these areas.  Provided relaxation position to promote lengthening oe perineal transverse mm and adductor/ hamstring.   Pt plans to see his urologist because he is worried about kidney stones with worsened pain at upper quadrant of abdomen and upper back bilaterally which is Sx he has had on and off.   Pt continues to benefit from skilled PT.     Examination-Activity Limitations Toileting;Sit    Stability/Clinical Decision Making Evolving/Moderate complexity    Rehab Potential Good    PT Frequency Monthy    PT Duration Other (comment)   6   PT Treatment/Interventions Neuromuscular  re-education;Patient/family education;Therapeutic exercise;Scar mobilization;Taping;Manual techniques;Moist Heat;Therapeutic activities;Gait training;Balance training    Consulted and Agree with Plan of Care Patient             Patient will benefit from skilled therapeutic intervention in order to improve the following deficits and impairments:  Abnormal gait, Decreased coordination, Decreased range of motion, Difficulty walking, Decreased endurance, Decreased safety awareness, Increased muscle spasms, Decreased balance, Decreased strength, Decreased mobility, Postural dysfunction, Improper body mechanics, Decreased scar mobility, Decreased activity tolerance, Increased fascial restricitons, Hypomobility  Visit Diagnosis: Sacrococcygeal disorders, not elsewhere classified  Pelvic pain  Other abnormalities of gait and mobility  Urinary frequency  Chronic bilateral low  back pain with left-sided sciatica  Abnormal posture     Problem List Patient Active Problem List   Diagnosis Date Noted   Excessive sleepiness 01/30/2021   Urinary frequency 12/23/2019   Pelvic pain 12/23/2019   Low back pain 12/22/2019    Jerl Mina, PT 12/07/2021, 2:43 PM  [place] MAIN [facility] SERVICES [address] [place], [place], [address] Phone: [phone]   Fax:  [phone]  Name: Kanye Depree MRN: [id number] Date of Birth: Sep 14, 1970

## 2021-12-14 ENCOUNTER — Ambulatory Visit: Payer: Self-pay | Admitting: Urology

## 2021-12-14 NOTE — Progress Notes (Incomplete)
   12/14/21 10:31 AM   Hershal Reynolds 30-Aug-1970 [id number]  Referring provider:  Ileana Ladd, MD [address] [place],  [place] [address] No chief complaint on file.     HPI: Alec Reynolds is a 51 y.o.male  with a personal history of chronic pelvic pain, penile mas, and pelvis floor dysfunction, who presents today for further evaluation of possible kidney stones.   He is under the care of [facility] here at [place].       PMH: No past medical history on file.  Surgical History: Past Surgical History:  Procedure Laterality Date   ELBOW SURGERY Left    WRIST SURGERY      Home Medications:  Allergies as of 12/14/2021   No Known Allergies      Medication List        Accurate as of December 14, 2021 10:31 AM. If you have any questions, ask your nurse or doctor.          gabapentin 300 MG capsule Commonly known as: NEURONTIN SMARTSIG:1 By Mouth 4-5 Times Daily   Gemtesa 75 MG Tabs Generic drug: Vibegron Take 75 mg by mouth daily.   sertraline 100 MG tablet Commonly known as: ZOLOFT Take 100 mg by mouth as directed.        Allergies:  No Known Allergies  Family History: Family History  Problem Relation Age of Onset   Stroke Father 71    Social History:  reports that he has never smoked. He has never used smokeless tobacco. He reports current alcohol use. He reports that he does not use drugs.   Physical Exam: There were no vitals taken for this visit.  Constitutional:  Alert and oriented, No acute distress. HEENT: [place] AT, moist mucus membranes.  Trachea midline, no masses. Cardiovascular: No clubbing, cyanosis, or edema. Respiratory: Normal respiratory effort, no increased work of breathing. Skin: No rashes, bruises or suspicious lesions. Neurologic: Grossly intact, no focal deficits, moving all 4 extremities. Psychiatric: Normal mood and affect.  Laboratory Data:  Lab Results  Component Value Date   CREATININE  1.00 12/14/2016   No results found for: "HGBA1C"  Urinalysis   Pertinent Imaging:    Assessment & Plan:     No follow-ups on file.  I,Kailey Littlejohn,acting as a Neurosurgeon for Vanna Scotland, MD.,have documented all relevant documentation on the behalf of Vanna Scotland, MD,as directed by  Vanna Scotland, MD while in the presence of Vanna Scotland, MD.   [facility] [address], [address] [place], [place] [address] [phone]

## 2021-12-20 ENCOUNTER — Ambulatory Visit: Payer: Self-pay | Admitting: Urology

## 2022-01-10 ENCOUNTER — Ambulatory Visit: Payer: 59 | Admitting: Urology

## 2022-01-10 VITALS — BP 102/68 | HR 59 | Ht 76.0 in | Wt 215.0 lb

## 2022-01-10 DIAGNOSIS — R109 Unspecified abdominal pain: Secondary | ICD-10-CM | POA: Diagnosis not present

## 2022-01-10 NOTE — Progress Notes (Signed)
01/10/22 2:34 PM   Alec Reynolds 11-Feb-1971 [id number]  Referring provider:  Ileana Ladd, MD No address on file Chief Complaint  Patient presents with   Flank Pain    HPI: Alec Reynolds is a 51 y.o.male  with a personal history of chronic pelvic pain, penile mas, and pelvis floor dysfunction, who presents today for further evaluation of right sided flank pain.   He is under the care of [facility] here at [place].   UA was unremarkable.   He reports today that his pain is in his right flank as well as right lower abdomen.  Primarily occurs just before in and around and just after the urinating.  No alleviating or exacerbating features.  He continues to play tennis regularly.  He has no personal history of kidney stones.  Recent imaging.   PMH: No past medical history on file.  Surgical History: Past Surgical History:  Procedure Laterality Date   ELBOW SURGERY Left    WRIST SURGERY      Home Medications:  Allergies as of 01/10/2022   No Known Allergies      Medication List        Accurate as of January 10, 2022  2:34 PM. If you have any questions, ask your nurse or doctor.          STOP taking these medications    Gemtesa 75 MG Tabs Generic drug: Vibegron       TAKE these medications    diazepam 5 MG tablet Commonly known as: VALIUM Take 5 mg by mouth as needed for anxiety.   gabapentin 300 MG capsule Commonly known as: NEURONTIN Take 300 mg by mouth 3 (three) times daily. What changed: Another medication with the same name was removed. Continue taking this medication, and follow the directions you see here.   sertraline 100 MG tablet Commonly known as: ZOLOFT Take 100 mg by mouth as directed.        Allergies:  No Known Allergies  Family History: Family History  Problem Relation Age of Onset   Stroke Father 41    Social History:  reports that he has never smoked. He has never used smokeless tobacco. He  reports current alcohol use. He reports that he does not use drugs.   Physical Exam: BP 102/68   Pulse (!) 59   Ht 6\' 4"  (1.93 m)   Wt 215 lb (97.5 kg)   BMI 26.17 kg/m   Constitutional:  Alert and oriented, No acute distress. HEENT: [place] AT, moist mucus membranes.  Trachea midline, no masses. Cardiovascular: No clubbing, cyanosis, or edema. Respiratory: Normal respiratory effort, no increased work of breathing. Skin: No rashes, bruises or suspicious lesions. Neurologic: Grossly intact, no focal deficits, moving all 4 extremities. Psychiatric: Normal mood and affect.  Laboratory Data:  Lab Results  Component Value Date   CREATININE 1.00 12/14/2016    Urinalysis Unremarkable   Assessment & Plan:   Right sided flank pain  - UA today was negative for hematuria  - Schedule RUS to further evaluate pain and rule out any stone burden, based on the nature of this pain suspect musculoskeletal rather than renal calculus this renal ultrasound should be sufficient to rule this out  Call with renal ultrasound report  I,Kailey Littlejohn,acting as a scribe for 12/16/2016, MD.,have documented all relevant documentation on the behalf of Alec Scotland, MD,as directed by  Alec Scotland, MD while in the presence of Alec Scotland, MD.  [facility]  [address] [place], [place] [address] [phone]

## 2022-01-11 ENCOUNTER — Ambulatory Visit: Payer: 59 | Attending: Physician Assistant | Admitting: Physical Therapy

## 2022-01-11 DIAGNOSIS — G8929 Other chronic pain: Secondary | ICD-10-CM | POA: Diagnosis not present

## 2022-01-11 DIAGNOSIS — R293 Abnormal posture: Secondary | ICD-10-CM | POA: Diagnosis not present

## 2022-01-11 DIAGNOSIS — M5442 Lumbago with sciatica, left side: Secondary | ICD-10-CM | POA: Insufficient documentation

## 2022-01-11 DIAGNOSIS — R102 Pelvic and perineal pain: Secondary | ICD-10-CM | POA: Insufficient documentation

## 2022-01-11 DIAGNOSIS — M533 Sacrococcygeal disorders, not elsewhere classified: Secondary | ICD-10-CM | POA: Insufficient documentation

## 2022-01-11 DIAGNOSIS — R2689 Other abnormalities of gait and mobility: Secondary | ICD-10-CM | POA: Insufficient documentation

## 2022-01-11 DIAGNOSIS — R350 Frequency of micturition: Secondary | ICD-10-CM | POA: Insufficient documentation

## 2022-01-11 DIAGNOSIS — M4125 Other idiopathic scoliosis, thoracolumbar region: Secondary | ICD-10-CM | POA: Insufficient documentation

## 2022-01-11 LAB — URINALYSIS, COMPLETE
Bilirubin, UA: NEGATIVE
Glucose, UA: NEGATIVE
Ketones, UA: NEGATIVE
Leukocytes,UA: NEGATIVE
Nitrite, UA: NEGATIVE
Protein,UA: NEGATIVE
Specific Gravity, UA: 1.015 (ref 1.005–1.030)
Urobilinogen, Ur: 0.2 mg/dL (ref 0.2–1.0)
pH, UA: 6 (ref 5.0–7.5)

## 2022-01-11 LAB — MICROSCOPIC EXAMINATION
Bacteria, UA: NONE SEEN
Epithelial Cells (non renal): NONE SEEN /hpf (ref 0–10)

## 2022-01-11 NOTE — Therapy (Signed)
OUTPATIENT PHYSICAL THERAPY TREATMENT NOTE   Patient Name: Alec Reynolds MRN: [id number] DOB:January 28, 1971, 51 y.o., male Today's Date: 01/12/2022   REFERRING PROVIDER: Fosnight    PT End of Session - 01/11/22 1308     Visit Number [id number]    Date for PT Re-Evaluation 01/18/22   PN 05/09/20, 07/21/20, 12/29/20, 04/20/21, 11/09/21   Authorization Type 9/ 35 visits of PT./OT/ chiro per year    PT Start Time 1304    PT Stop Time 1400    PT Time Calculation (min) 56 min    Activity Tolerance Patient tolerated treatment well;No increased pain    Behavior During Therapy [facility] for tasks assessed/performed             No past medical history on file. Past Surgical History:  Procedure Laterality Date   ELBOW SURGERY Left    WRIST SURGERY     Patient Active Problem List   Diagnosis Date Noted   Excessive sleepiness 01/30/2021   Urinary frequency 12/23/2019   Pelvic pain 12/23/2019   Low back pain 12/22/2019    REFERRING DIAG:     Diagnosis Description  Pelvic Floor dysfunction    THERAPY DIAG:  Sacrococcygeal disorders, not elsewhere classified  Pelvic pain  Urinary frequency  Other abnormalities of gait and mobility  Chronic bilateral low back pain with left-sided sciatica  Abnormal posture  Other idiopathic scoliosis, thoracolumbar region  Rationale for Evaluation and Treatment Rehabilitation  PERTINENT HISTORY:  R groin injury 2017R , anxiety, stress Fx in R shin, low back as kid. 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 Pt also enjoyed cycling and stopped when the pain started. Pt used to cycle 2-3 hours 200 miles / day. Pt did not have a stretch routine. Pt completed Pelvic PT via telehealth one year and tried stretching but it made it worse on R groin injury 2017R , anxiety, stress Fx in R shin, low back as kid. 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 Pt also enjoyed cycling and stopped when the pain started. Pt used to cycle 2-3 hours 200 miles / day. Pt did not have a stretch routine. Pt completed Pelvic PT via telehealth one year and tried stretching but it made it worse  PRECAUTIONS:   SUBJECTIVE:   Pt had to fly to [facility] and experienced urinary frequency and pain and constipation.  Pt noticed after the past sessions when manual Tx was applied on the L ischial rami location, pt experienced increased pain and then the pain, frequency , pelvic floor tightness, and constipation would dial down for 3-4 days.    Pt has been taking diazepam for the past 2 years and gabapentin for 2.5 years.   Pt saw urologist who ordered an [place] for abdomen because he experiences pain at the upper abdomen area both sides ( mostly on R) when he has strong urge to pee and he can not pee.    Walking causes not increased pain unless at faster speed.  Pt noticed his improvement with no urge to pee when he runs and plays a high intensity tennis match.   Pt still has issues with sitting on hard surfaces and walking up inclines.   PAIN:  Are you having pain? Yes: 3-4/ 10 pain   TODAY'S TREATMENT:   [facility] PT Assessment -  01/12/22 0946       Squat   Comments deep squat , with proper mobility , holding onto chair, plan to assess without chair holding at next session      Palpation   Palpation comment tightness along L lower kinetic chain, restrictions pulling internal rotation of thigh, medial collapse of ankle, semimendranosus/ distal attachments of femorual triangle at tib plateau, medial aspec to taolcural joint L             [facility] Adult PT Treatment/Exercise - 01/12/22 0949       Therapeutic Activites    Other Therapeutic Activities active listening to updates from past month and remaining issues to work on with Pelvic PT      Neuro Re-ed    Neuro Re-ed Details  cued for deep squat holding chair, rolled yoga mat under  heel to strengthen plantar fascia,      Manual Therapy   Manual therapy comments STM/MWM at problem areas noted in assessment to promote ER of thigh, knee , lifted arch of L foot    Kinesiotex --   ER rotation of thigh, knee, DF/EV of L foot              PATIENT EDUCATION: Education details: discussed with pt his plan to communicate with his doctors about weaning him off diazepam. Pt 's doctor told him to try botox injection as a way to wean off diazapem.  Plan on working on remaining goals to sit on hard surfaces, walk up hills, return to jogging and be prepped for hiking   Person educated: Patient Education method: Explanation, Demonstration, Tactile cues, Verbal cues, and Handouts Education comprehension: verbalized understanding, returned demonstration, verbal cues required, tactile cues required, and needs further education   HOME EXERCISE PROGRAM: See pt instruction section       PT Long Term Goals -      PT LONG TERM GOAL #1   Title Pt will demo decreased abdominal separation from 4 fingers width  to < 1 fingers width to improve IAP system for pelvic floor function    Time 4    Period Weeks    Status Achieved      PT LONG TERM GOAL #2   Title Pt will demo improved posture with more anterior tilt of pelvis, increased lumbar lordosis, and no cues for proper sitting/ standing posture  in order to minimize CLBP and return to more functional activities    Time 6    Period Weeks    Status Achieved      PT LONG TERM GOAL #3   Title Pt will demo no spinal deviation and more levelled pelvic girdle across 2 visits in order to advance towards deep core/ thoracolumbar strengthening exercises    Time 2    Period Weeks    Status Achieved      PT LONG TERM GOAL #4   Title Pt will demo decreased R pelvic floor mm tightness across 2 visits in order to minimize pelvic pain and straining with bowel movements and optimize IAP system properly    Time 8    Period Weeks    Status  Achieved      PT LONG TERM GOAL #5   Title Pt's FOTO score for Pain to decrease from 29pts to < 24pts and Urinary from 38 pts to < 30 pts and Bowel from 17 pts to < 12 pts and  Bowel Leakage improve 59 pts to > 64 pts  Baseline 11/8:Urinary  38 pts to 33 pts, PFDI Bowel 17pts to 4pts, bowel Leakage 59pts to 72 pts   10/22/21 :  Pain 29pt --> 4pts,  Urinary 38 pts--> 4pts, Bowel 18 pts-->0 pts, Leakage  59 pts --> 83 pts    Time 10    Period Weeks    Status Achieved      PT LONG TERM GOAL #6   Title Pt will report no urge to urinate when sitting or bending forward to put on his back pack  in order to attend meetings or leave the house    Baseline 10/12/21: 50% improvement)  11/09/21: 60% improvement    Time 8    Period Weeks    Status Partially Met      PT LONG TERM GOAL #7   Title Pt 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    Time 10    Period Weeks    Status Achieved      PT LONG TERM GOAL  #10   TITLE Pt will be able to hike for 63mles on low grade trail and/or 2 miles on a trail with inclines and report no pain .    Time 6    Period Months    Status Partially Met    Target Date 10/12/21      PT LONG TERM GOAL  #11   TITLE Pt will maintain 2 cm of plantar arches and and no tightness at L ITband/ around GT and lateral leg, hypomobility at lateral foot with compliance with modification with tossing tennis balls repeated at work     Baseline (10/12/21:  2cm B)    Time 10    Period Weeks    Status Achieved    Target Date 10/12/21      PT LONG TERM GOAL  #12   TITLE Pt will report decreased urgency with incline walking in order to hike with family    Time 10    Period Weeks    Status On-going    Target Date 11/09/21      PT LONG TERM GOAL  #13   TITLE Pt will demo no tensions nor tenderness at ischiocavernosus on L ( 2 o'clock position_) and at L ischial tuberosity across 2 visits    Status New    Target Date 12/21/21              Plan - 01/12/22 0954     Clinical Impression Statement Pt returned after 1.5 months and [place] and upper abodominal pain with flying on a plane.   Pt has an appt with urologist who has ordered imagining to assess his upper abdominal  pain that is associated with his Sx.  Pt demo'd increased tightness along L lower knietic chain which in the past was the side with more medial collapse of his L foot and had big toe injury. Pt required more manual Tx to promote more ER of thigh, tibia, and lifted arch of L foot. Pt reported less tenderness post Tx. Manual treatment was modified when pr reported or showed facial expression of tenderness / pain.  Patient tolerated treatment with manual Tx modifications without pain.   Pt was able to achieve deep squat with hand on chair and rolled yoga mat placed under heels to strengthen plantar fascia. Added this to his HEP to further maintain ER of hip/ tibia, lift arches, while lengthening posterior pelvic floor mm.   Discussed with pt his plan to communicate with his doctors about weaning him off diazepam. Pt 's doctor told him to try botox injection as a way to wean off diazapem.  Plan to address remaining goals to sit on hard surfaces, walk up hills, return to jogging and be prepped for hiking .    Pt continues to benefit from skilled PT     Examination-Activity Limitations  Toileting;Sit    Stability/Clinical Decision Making Evolving/Moderate complexity    Rehab Potential Good    PT Frequency Monthy    PT Duration Other (comment)   6   PT Treatment/Interventions Neuromuscular re-education;Patient/family education;Therapeutic exercise;Scar mobilization;Taping;Manual techniques;Moist Heat;Therapeutic activities;Gait training;Balance training    Consulted and Agree with Plan of Care Patient               Jerl Mina, PT 01/12/2022, 9:54 AM

## 2022-01-25 ENCOUNTER — Ambulatory Visit: Payer: 59

## 2022-01-31 ENCOUNTER — Ambulatory Visit
Admission: RE | Admit: 2022-01-31 | Discharge: 2022-01-31 | Disposition: A | Discharge status: Home / Self Care | Payer: 59 | Source: Ambulatory Visit | Attending: Urology | Admitting: Urology

## 2022-01-31 DIAGNOSIS — R109 Unspecified abdominal pain: Secondary | ICD-10-CM | POA: Insufficient documentation

## 2022-01-31 DIAGNOSIS — N1330 Unspecified hydronephrosis: Secondary | ICD-10-CM | POA: Diagnosis not present

## 2022-02-01 ENCOUNTER — Telehealth: Payer: Self-pay | Admitting: *Deleted

## 2022-02-01 ENCOUNTER — Ambulatory Visit: Payer: 59 | Attending: Physician Assistant | Admitting: Physical Therapy

## 2022-02-01 DIAGNOSIS — R2689 Other abnormalities of gait and mobility: Secondary | ICD-10-CM | POA: Insufficient documentation

## 2022-02-01 DIAGNOSIS — G8929 Other chronic pain: Secondary | ICD-10-CM | POA: Insufficient documentation

## 2022-02-01 DIAGNOSIS — R350 Frequency of micturition: Secondary | ICD-10-CM | POA: Insufficient documentation

## 2022-02-01 DIAGNOSIS — R293 Abnormal posture: Secondary | ICD-10-CM | POA: Diagnosis not present

## 2022-02-01 DIAGNOSIS — M533 Sacrococcygeal disorders, not elsewhere classified: Secondary | ICD-10-CM | POA: Insufficient documentation

## 2022-02-01 DIAGNOSIS — R102 Pelvic and perineal pain: Secondary | ICD-10-CM | POA: Diagnosis not present

## 2022-02-01 DIAGNOSIS — M5442 Lumbago with sciatica, left side: Secondary | ICD-10-CM | POA: Diagnosis not present

## 2022-02-01 NOTE — Patient Instructions (Addendum)
    3- foot tap  30 reps  Each side  X 2 DAY   Hold onto wall   Slightly bend of standing knee, and keep hips above foot   ballmound of opposite leg   taps to each direction and   back to spot under hips- notice equal pressure through both legs, and across ballmound and heels   DO NOT LET MEDIAL ARCH DROP     ___     Clam Shell 45 Degrees RIGHT SIDE ONLY    Lying with hips and knees bent 45, one pillow between knees and ankles. Heel together, toes apart like ballerina,  Lift knee with exhale while pressing heels together. Be sure pelvis does not roll backward. Do not arch back. Do 20 times, each leg, 2 times per day.    Complimentary stretch: Figure-4   3 breaths

## 2022-02-01 NOTE — Telephone Encounter (Addendum)
Patient informed, voiced understanding.      ----- Message from Vanna Scotland, MD sent at 02/01/2022  8:15 AM EDT ----- RUS is essentially normal.  No concerning findings, great news.  Bladder does not empty completely upon urination but this is consistent with known pelvic floor dysfunction.  Vanna Scotland, MD

## 2022-02-01 NOTE — Therapy (Signed)
OUTPATIENT PHYSICAL THERAPY TREATMENT NOTE / recert   Patient Name: Alec Reynolds MRN: [id number] DOB:1970/07/15, 51 y.o., male Today's Date: 02/01/2022   REFERRING PROVIDER: [employer]night    PT End of Session - 02/01/22 1337     Visit Number 71    Date for PT Re-Evaluation 04/12/22   PN 05/09/20, 07/21/20, 12/29/20, 04/20/21, 11/09/21   Authorization Type 10/ 35 visits of PT./OT/ chiro per year    PT Start Time 1330    PT Stop Time 1415    PT Time Calculation (min) 45 min    Activity Tolerance Patient tolerated treatment well;No increased pain    Behavior During Therapy [facility] for tasks assessed/performed             No past medical history on file. Past Surgical History:  Procedure Laterality Date   ELBOW SURGERY Left    WRIST SURGERY     Patient Active Problem List   Diagnosis Date Noted   Excessive sleepiness 01/30/2021   Urinary frequency 12/23/2019   Pelvic pain 12/23/2019   Low back pain 12/22/2019    REFERRING DIAG:     Diagnosis Description  Pelvic Floor dysfunction    THERAPY DIAG:  Sacrococcygeal disorders, not elsewhere classified  Pelvic pain  Urinary frequency  Other abnormalities of gait and mobility  Chronic bilateral low back pain with left-sided sciatica  Abnormal posture  Rationale for Evaluation and Treatment Rehabilitation  PERTINENT HISTORY:  R groin injury 2017R , anxiety, stress Fx in R shin, low back as kid. 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 Pt also enjoyed cycling and stopped when the pain started. Pt used to cycle 2-3 hours 200 miles / day. Pt did not have a stretch routine. Pt completed Pelvic PT via telehealth one year and tried stretching but it made it worse on R groin injury 2017R , anxiety, stress Fx in R shin, low back as kid. 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 Pt also enjoyed  cycling and stopped when the pain started. Pt used to cycle 2-3 hours 200 miles / day. Pt did not have a stretch routine. Pt completed Pelvic PT via telehealth one year and tried stretching but it made it worse  PRECAUTIONS:   SUBJECTIVE:   Subjective Assessment - 02/01/22 1338     Subjective Pt felt more relaxed after last session with less pain. Pt noted a connection from the adductor mm to his Sx. Pt saw urologist and test confirmed problem with emptying urine completely and US findings were neg.  Pt still has the upper abdominal area sharp pain when he is getting the urge to pee and can not access a bathroom. Then he will pee at full blast. 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 Pt noticed more pain when sitting on picnic table and it is not as bad as last year. Pt is noticing pain on the R pelvic area not just L. When he sits on hard surfaces, pt shifts from each sitting bones due to discomfort. Pt only sits on hard surface once every 6 months because he typically avoids it.  When he is running around on the court side to side and playing all directions, he notices his urge is less and his muscles are looser.  Pertinent History R groin injury 2017R ,  anxiety, stress Fx in R shin, low back as kid. 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 Pt also enjoyed cycling and stopped when the pain started. Pt used to cycle 2-3 hours 200 miles / day. Pt did not have a stretch routine. Pt completed Pelvic PT via telehealth one year and tried stretching but it made it worse.                TODAY'S TREATMENT:   [facility] PT Assessment - 02/01/22 1356       Other:   Other/ Comments 3-foot tap HEP with L medial arch collapse, simulated tennis movements: L foot is has more WBing      Strength   Overall Strength Comments L hip abd 5/5 , R hip 3+/5  , triggered groin pain,  , L hip ext 5/5, R 4-/5,              [facility] Adult PT Treatment/Exercise - 01/12/22 0949       Therapeutic Activites    Other Therapeutic Activities active listening to updates from past month and remaining issues to work on with Pelvic PT      Neuro Re-ed    Neuro Re-ed Details  cued for deep squat holding chair, rolled yoga mat under heel to strengthen plantar fascia,      Manual Therapy   Manual therapy comments STM/MWM at problem areas noted in assessment to promote ER of thigh, knee , lifted arch of L foot    Kinesiotex --   ER rotation of thigh, knee, DF/EV of L foot              PATIENT EDUCATION: Education details: showed anatomy of mm attached by ischial tuberosity and explained correlation to his pain with functional tasks. Explained the role of decreased Wbing on RLE in simulated tennis movements and why hip abd/ ext were weaker on R with more overactivity on L side   Person educated: Patient Education method: Explanation, Demonstration, Tactile cues, Verbal cues, and Handouts  Education comprehension: verbalized understanding, returned demonstration, verbal cues required, tactile cues required, and needs further education   HOME EXERCISE PROGRAM: See pt instruction section       PT Long Term Goals -      PT LONG TERM GOAL #1   Title Pt will demo decreased abdominal separation from 4 fingers width  to < 1 fingers width to improve IAP system for pelvic floor function    Time 4    Period Weeks    Status Achieved      PT LONG TERM GOAL #2   Title Pt will demo improved posture with more anterior tilt of pelvis, increased lumbar lordosis, and no cues for proper sitting/ standing posture  in order to minimize CLBP and return to more functional activities    Time 6    Period Weeks    Status Achieved      PT LONG TERM GOAL #3   Title Pt will demo no spinal deviation and more levelled pelvic girdle across 2 visits in order to advance towards deep core/ thoracolumbar strengthening exercises     Time 2    Period Weeks    Status Achieved      PT LONG TERM GOAL #4   Title Pt will demo decreased R pelvic floor mm tightness across 2 visits  in order to minimize pelvic pain and straining with bowel movements and optimize IAP system properly    Time 8    Period Weeks    Status Achieved      PT LONG TERM GOAL #5   Title Pt's FOTO score for Pain to decrease from 29pts to < 24pts and Urinary from 38 pts to < 30 pts and Bowel from 17 pts to < 12 pts and  Bowel Leakage improve 59 pts to > 64 pts    Baseline 11/8:Urinary  38 pts to 33 pts, PFDI Bowel 17pts to 4pts, bowel Leakage 59pts to 72 pts   10/22/21 :  Pain 29pt --> 4pts,  Urinary 38 pts--> 4pts, Bowel 18 pts-->0 pts, Leakage  59 pts --> 83 pts    Time 10    Period Weeks    Status Achieved      PT LONG TERM GOAL #6   Title Pt will report no urge to urinate when sitting or bending forward to put on his back pack  in order to attend meetings or leave the house    Baseline 10/12/21: 50% improvement)  11/09/21: 60% improvement    Time 8    Period Weeks    Status Partially Met  03/29/2022       PT LONG TERM GOAL #7   Title Pt 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    Time 10    Period Weeks    Status Achieved      PT LONG TERM GOAL  #10   TITLE Pt will be able to hike for 51miles on low grade trail and/or 2 miles on a trail with inclines and report no pain .    Time 6    Period Months     Status Partially Met    Target Date 03/15/2022     PT LONG TERM GOAL  #11   TITLE Pt will maintain 2 cm of plantar arches and and no tightness at L ITband/ around GT and lateral leg, hypomobility at lateral foot with compliance with modification with tossing tennis balls repeated at work    Baseline (10/12/21:  2cm B)    Time 10    Period Weeks    Status Achieved    Target Date 10/12/21      PT LONG TERM GOAL  #12   TITLE Pt will report decreased urgency with incline walking in order to hike with family    Time 10    Period Weeks    Status On-going    Target Date 04/12/2022       PT LONG TERM GOAL  #13   TITLE Pt will demo no tensions nor tenderness at ischiocavernosus on  L ( 2 o'clock position_) and at L ischial tuberosity across 2 visits    Status New    Target Date 12/21/21              Plan -     Clinical Impression Statement Pt achieved 9/13 goals and progressing well towards remaining goals. Pt's improvements:  When he is running around on the court side to side and playing all directions, he notices his urge is less and his muscles are looser.   Remaining issues being focused on with bimonthly sessions: P still has the upper abdominal area sharp pain when he is getting the urge to pee and can not access a bathroom. Then he will pee at full blast. 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 Pt noticed more pain when sitting on picnic table and it is not as bad as last year. Pt is noticing pain on the R pelvic area not just L. When he sits on hard surfaces, pt shifts from each sitting bones due to discomfort. Pt only sits on hard surface once every 6 months because he typically avoids it.   Today, pt reported decreased L pelvic floor mm pain and showed less tightness when pt was cued to activate medial arch and toe abduction/ ext. Pt also showed collapse of L medial arch in SLS HEP. Pt also showed weakness of R hip abduction and extension.  Addressing these deficits with customized HEP and propioception training. Pt showed simulated tennis movements and L LE has more WBing which explains why R glut mm are weaker.  Plan to add more R hip abd/ ext strengthening to HEP at next session. Pt required cues for today's [facility] exercise to lift medial arch and improve toe abduction.   Pt continues to benefit from skilled PT     Examination-Activity Limitations Toileting;Sit    Stability/Clinical Decision Making Evolving/Moderate complexity    Rehab Potential Good    PT Frequency BiMonthy    PT Duration Other (comment)   5 months   PT Treatment/Interventions Neuromuscular re-education;Patient/family education;Therapeutic exercise;Scar mobilization;Taping;Manual techniques;Moist Heat;Therapeutic activities;Gait training;Balance training    Consulted and Agree with Plan of Care Patient               Jerl Mina, PT 02/01/2022, 5:36 PM

## 2022-03-01 ENCOUNTER — Ambulatory Visit: Payer: 59 | Admitting: Physical Therapy

## 2022-03-01 DIAGNOSIS — M533 Sacrococcygeal disorders, not elsewhere classified: Secondary | ICD-10-CM

## 2022-03-01 DIAGNOSIS — R2689 Other abnormalities of gait and mobility: Secondary | ICD-10-CM | POA: Diagnosis not present

## 2022-03-01 DIAGNOSIS — G8929 Other chronic pain: Secondary | ICD-10-CM | POA: Diagnosis not present

## 2022-03-01 DIAGNOSIS — R102 Pelvic and perineal pain: Secondary | ICD-10-CM | POA: Diagnosis not present

## 2022-03-01 DIAGNOSIS — R293 Abnormal posture: Secondary | ICD-10-CM | POA: Diagnosis not present

## 2022-03-01 DIAGNOSIS — R350 Frequency of micturition: Secondary | ICD-10-CM | POA: Diagnosis not present

## 2022-03-01 DIAGNOSIS — M5442 Lumbago with sciatica, left side: Secondary | ICD-10-CM | POA: Diagnosis not present

## 2022-03-01 NOTE — Patient Instructions (Signed)
    Mermaid stretch over the edge of the bed.   Sit 45 deg turn on edge of bed,  Back knee and foot aligned with hip ( quad stretch)   Active with ankle on the edge of the bed, pinky side up and out ( the other hip and pelvic floor stretch while training the ankle to DF/EV)  Rocking forward toward bent knee and then back towards other buttock    ___  Ankle strengthening on L with band band wrapped around outer L side of foot swivels up and out  , pinky higher than ballmound  ballmound of L foot pressing onto band , R foot is placed on top of band hip width apart, with the ballmound ,  R hand holds the band 20  reps swinging L pinky toe out to the L  X 2x day

## 2022-03-01 NOTE — Therapy (Addendum)
OUTPATIENT PHYSICAL THERAPY TREATMENT NOTE      Patient Name: Alec Reynolds MRN: [id number] DOB:09/11/1970, 51 y.o., male Today's Date: 03/01/2022     REFERRING PROVIDER: Jay Schlichter      PT End of Session -       Visit Number 59    Date for PT Re-Evaluation 04/12/22   PN 05/09/20, 07/21/20, 12/29/20, 04/20/21, 11/09/21    Authorization Type 11/ 35 visits of PT./OT/ chiro per year     PT Start Time 1497     PT Stop Time 1504      PT Time Calculation (min) 40 min     Activity Tolerance Patient tolerated treatment well;No increased pain     Behavior During Therapy [facility] for tasks assessed/performed                   No past medical history on file.      Past Surgical History:  Procedure Laterality Date   ELBOW SURGERY Left     WRIST SURGERY            Patient Active Problem List    Diagnosis Date Noted   Excessive sleepiness 01/30/2021   Urinary frequency 12/23/2019   Pelvic pain 12/23/2019   Low back pain 12/22/2019      REFERRING DIAG:      Diagnosis Description  Pelvic Floor dysfunction      THERAPY DIAG:  Sacrococcygeal disorders, not elsewhere classified   Pelvic pain   Urinary frequency   Other abnormalities of gait and mobility   Chronic bilateral low back pain with left-sided sciatica   Abnormal posture   Rationale for Evaluation and Treatment Rehabilitation   PERTINENT HISTORY:  R groin injury 2017R , anxiety, stress Fx in R shin, low back as kid. 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 Pt also enjoyed cycling and stopped when the pain started. Pt used to cycle 2-3 hours 200 miles / day. Pt did not have a stretch routine. Pt completed Pelvic PT via telehealth one year and tried stretching but it made it worse on R groin injury 2017R , anxiety, stress Fx in R shin, low back as kid. 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 Pt also enjoyed cycling and stopped when the pain started. Pt used to cycle 2-3 hours 200 miles / day. Pt did not have a stretch routine. 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 Pt noticed increased urgency and pelvic pain at the same spot when he had to work out more. Pt notices tightness at L top of the hip and thigh, hamstring.   Pt walked for an hour for the first time not walking and noticed urge for urination and BM and pt had to make a made dash into the house.  Pt plans to walk shorter increments next time like 20 min 25 min at a time.        Pertinent History R groin injury 2017R ,  anxiety, stress Fx in R shin, low back as kid. 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 Pt also enjoyed cycling and stopped when the pain started. Pt used to cycle 2-3 hours 200 miles / day. Pt did not have a stretch routine. Pt completed Pelvic PT via telehealth one year and tried stretching but it made it worse.                         TODAY'S TREATMENT:   [facility] PT Assessment - 03/01/22 1510       Other:   Other/ Comments simulated tennis playing , LLE with decreased WBing      Strength   Overall Strength Comments ankle DF/EV on L 4-/5, R 5/5      Palpation   Palpation comment tenderness at the  L  medial / Lateral tib plateau             Pelvic Floor Special Questions - 03/01/22 1511     Pelvic Floor Internal Exam pt consented verbally and had no contraindications    Exam Type Rectal    Palpation tightness10-11 o'clock with tenderness and tightness, ischial rami/ ischiotuberosity L ( decreased  with cue for more DF/EV against the plinth, toe abduction, cued for anterior tilt , cue for less chest breathing )             [facility] Adult PT Treatment/Exercise - 03/01/22 1512       Therapeutic Activites    Other Therapeutic Activities showed anatomy images and explained the connection of L5,S1 for peronal longus/ brevis, tertius for DF/EV and for obt int on L and connection  of nn where he felt tenderness at lateral tib plateau, pelvic floor , weakness of L DF/EV strength      Neuro Re-ed    Neuro Re-ed Details  cued for proper technique for resistance DF/EV strengthening      Manual Therapy   Internal Pelvic Floor STM/MWM and propioception training with tactil cues at pelvic floor for anterior tilt, less chest breathing and feet (                 PATIENT EDUCATION: Education details: showed anatomy of mm attached by ischial tuberosity and explained correlation to his pain with functional tasks. Explained the role of decreased Wbing on RLE in simulated tennis movements and why hip abd/ ext were weaker on R with more overactivity on L side    Person educated: Patient Education method: Explanation, Demonstration, Tactile cues, Verbal cues, and Handouts   Education comprehension: verbalized understanding, returned demonstration, verbal cues required, tactile cues required, and needs further education     HOME EXERCISE PROGRAM: See pt instruction section            PT Long Term Goals -               PT LONG TERM GOAL #1    Title Pt will demo decreased abdominal separation from 4 fingers width  to < 1 fingers width to improve IAP system for pelvic floor function     Time 4     Period Weeks     Status Achieved          PT LONG TERM GOAL #2    Title Pt will demo improved posture with more anterior tilt of pelvis, increased lumbar lordosis, and no cues  for proper sitting/ standing posture  in order to minimize CLBP and return to more functional activities     Time 6      Period Weeks     Status Achieved          PT LONG TERM GOAL #3    Title Pt will demo no spinal deviation and more levelled pelvic girdle across 2 visits in order to advance towards deep core/ thoracolumbar strengthening exercises     Time 2     Period Weeks     Status Achieved          PT LONG TERM GOAL #4    Title Pt will demo decreased R pelvic floor mm tightness across 2 visits in order to minimize pelvic pain and straining with bowel movements and optimize IAP system properly     Time 8     Period Weeks     Status Achieved          PT LONG TERM GOAL #5    Title Pt's FOTO score for Pain to decrease from 29pts to < 24pts and Urinary from 38 pts to < 30 pts and Bowel from 17 pts to < 12 pts and  Bowel Leakage improve 59 pts to > 64 pts     Baseline 11/8:Urinary  38 pts to 33 pts, PFDI Bowel 17pts to 4pts, bowel Leakage 59pts to 72 pts   10/22/21 :  Pain 29pt --> 4pts,  Urinary 38 pts--> 4pts, Bowel 18 pts-->0 pts, Leakage  59 pts --> 83 pts     Time 10     Period Weeks     Status Achieved          PT LONG TERM GOAL #6    Title Pt will report no urge to urinate when sitting or bending forward to put on his back pack  in order to attend meetings or leave the house     Baseline 10/12/21: 50% improvement)  11/09/21: 60% improvement     Time 8     Period Weeks     Status Partially Met  03/29/2022             PT LONG TERM GOAL #7    Title Pt 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     Time 10     Period Weeks     Status Achieved          PT LONG TERM GOAL  #10    TITLE Pt will be able to hike for 33mles on low grade trail and/or 2 miles on a  trail with inclines and report no pain .     Time 6     Period Months     Status Partially Met     Target Date 03/15/2022         PT LONG TERM GOAL  #11    TITLE Pt will maintain 2 cm of plantar arches and and no tightness at L ITband/ around GT and lateral leg, hypomobility at lateral foot with compliance with modification with tossing tennis balls repeated at work     Baseline (10/12/21:  2cm B)     Time 10     Period Weeks     Status Achieved     Target Date 10/12/21          PT LONG TERM GOAL  #12    TITLE Pt will report decreased urgency with incline walking in order to hike with family     Time 10     Period Weeks     Status On-going     Target Date 04/12/2022             PT LONG TERM GOAL  #13    TITLE Pt will demo no tensions nor tenderness at ischiocavernosus on L ( 2 o'clock position_) and at L ischial tuberosity across 2 visits     Status New     Target Date 12/21/21                     Plan -       Clinical Impression Statement Pt overall reports he is having more "better days".  Last week pt notices increased pelvic pain at the same L area with associated increased urgency when he had to play tennis matches with students with more vigor and intensity to match their higher playing level.  Pt also noted urgency with urination and bowel movements after he walked for one hour when he had not built up a walking routine prior.   Today's internal assessment showed a connection of L5,S1 for peronal longus/ brevis/ tertius for DF/EV and for obt int on L.  Pelvic tightness decreased when cued for more DF/EV against the plinth, anterior tilt of pelvis, and less chest breathing.  He felt tenderness at lateral tib plateau and showed weakness of L DF/EV  strength compared to R. In simulated tennis movements, pt showed less WBing on L LE which is related to the findings above. Provided resistance band strengthening for L DF/EV with cues for proper alignment and technique. Manual Tx also helped to decreased tenderness at lateral tibial plateau which required more ER of tibia. Anticipated new stretch today will help promote ER of tibia, DF/EV of L foot.  Other possibility of weaker L foot may be due to past toe injury/ adducted big toe but that has since improved with PT. Medial arch continue to be addressed.      Plan to continue working with regional interdependent approach.  Pt continues to benefit from skilled PT       Examination-Activity Limitations Toileting;Sit     Stability/Clinical Decision Making Evolving/Moderate complexity     Rehab Potential Good     PT Frequency BiMonthy     PT Duration Other (comment)   5 months    PT Treatment/Interventions Neuromuscular re-education;Patient/family education;Therapeutic exercise;Scar mobilization;Taping;Manual techniques;Moist Heat;Therapeutic activities;Gait training;Balance training     Consulted and Agree with Plan of Care Patient  Jerl Mina, PT

## 2022-03-15 ENCOUNTER — Ambulatory Visit: Payer: 59 | Attending: Physician Assistant | Admitting: Physical Therapy

## 2022-03-15 DIAGNOSIS — M5442 Lumbago with sciatica, left side: Secondary | ICD-10-CM | POA: Insufficient documentation

## 2022-03-15 DIAGNOSIS — M533 Sacrococcygeal disorders, not elsewhere classified: Secondary | ICD-10-CM | POA: Diagnosis present

## 2022-03-15 DIAGNOSIS — R293 Abnormal posture: Secondary | ICD-10-CM | POA: Insufficient documentation

## 2022-03-15 DIAGNOSIS — R2689 Other abnormalities of gait and mobility: Secondary | ICD-10-CM | POA: Diagnosis present

## 2022-03-15 DIAGNOSIS — R102 Pelvic and perineal pain: Secondary | ICD-10-CM | POA: Insufficient documentation

## 2022-03-15 DIAGNOSIS — R350 Frequency of micturition: Secondary | ICD-10-CM | POA: Diagnosis present

## 2022-03-15 DIAGNOSIS — M4125 Other idiopathic scoliosis, thoracolumbar region: Secondary | ICD-10-CM | POA: Insufficient documentation

## 2022-03-15 DIAGNOSIS — G8929 Other chronic pain: Secondary | ICD-10-CM | POA: Diagnosis present

## 2022-03-15 NOTE — Therapy (Unsigned)
OUTPATIENT PHYSICAL THERAPY TREATMENT NOTE      Patient Name: Alec Reynolds MRN: [id number] DOB:[birth year], 51 y.o., male Today's Date: 02/01/2022     REFERRING PROVIDER: Jay Schlichter      PT End of Session -       Visit Number 61    Date for PT Re-Evaluation 04/12/22   PN 05/09/20, 07/21/20, 12/29/20, 04/20/21, 11/09/21    Authorization Type 11/ 35 visits of PT./OT/ chiro per year     PT Start Time 1424     PT Stop Time 1504      PT Time Calculation (min) 40 min     Activity Tolerance Patient tolerated treatment well;No increased pain     Behavior During Therapy [facility] for tasks assessed/performed                   No past medical history on file.      Past Surgical History:  Procedure Laterality Date   ELBOW SURGERY Left     WRIST SURGERY            Patient Active Problem List    Diagnosis Date Noted   Excessive sleepiness 01/30/2021   Urinary frequency 12/23/2019   Pelvic pain 12/23/2019   Low back pain 12/22/2019      REFERRING DIAG:      Diagnosis Description  Pelvic Floor dysfunction      THERAPY DIAG:  Sacrococcygeal disorders, not elsewhere classified   Pelvic pain   Urinary frequency   Other abnormalities of gait and mobility   Chronic bilateral low back pain with left-sided sciatica   Abnormal posture   Rationale for Evaluation and Treatment Rehabilitation   PERTINENT HISTORY:  R groin injury 2017R , anxiety, stress Fx in R shin, low back as kid. 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 Pt also enjoyed cycling and stopped when the pain started. Pt used to cycle 2-3 hours 200 miles / day. Pt did not have a stretch routine. Pt completed Pelvic PT via telehealth one year and tried stretching but it made it worse on R groin injury 2017R , anxiety, stress Fx in R shin, low back as kid. 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 Pt also enjoyed cycling and stopped when the pain started. Pt used to cycle 2-3 hours 200 miles / day. Pt did not have a stretch routine. Pt completed Pelvic PT via telehealth one year and tried stretching but it made it worse   PRECAUTIONS:    SUBJECTIVE:    Subjective Assessment -       Subjective Pt reported he noticed he is able to sit with 25% less pain since last session. Urgency is still the same. Pt has been doing the L ankle strengthening exercises and his arch is getting stronger. Pt feels his toes more. Pt notices a groin / pubic pain when he is waking up in the mornings and standing       Pertinent History R groin injury 2017 ,  anxiety, stress Fx in R shin, low back as kid. 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 Pt also enjoyed cycling and stopped when the pain started. Pt used to cycle 2-3 hours 200 miles / day.  Pt did not have a stretch routine. Pt completed Pelvic PT via telehealth one year and tried stretching but it made it worse.                         TODAY'S TREATMENT:     PATIENT EDUCATION: Education details: showed anatomy of mm attached by ischial tuberosity and explained correlation to his pain with functional tasks. Explained the role of decreased Wbing on RLE in simulated tennis movements and why hip abd/ ext were weaker on R with more overactivity on L side    Person educated: Patient Education method: Explanation, Demonstration, Tactile cues, Verbal cues, and Handouts   Education comprehension: verbalized understanding, returned demonstration, verbal cues required, tactile cues required, and needs further education     HOME EXERCISE PROGRAM: See pt instruction section            PT Long Term Goals -               PT LONG TERM GOAL #1    Title Pt will demo decreased abdominal separation from 4 fingers width  to < 1 fingers width to improve IAP system for pelvic  floor function     Time 4     Period Weeks     Status Achieved          PT LONG TERM GOAL #2    Title Pt will demo improved posture with more anterior tilt of pelvis, increased lumbar lordosis, and no cues for proper sitting/ standing posture  in order to minimize CLBP and return to more functional activities     Time 6     Period Weeks     Status Achieved          PT LONG TERM GOAL #3    Title Pt will demo no spinal deviation and more levelled pelvic girdle across 2 visits in order to advance towards deep core/ thoracolumbar strengthening exercises     Time 2     Period Weeks     Status Achieved          PT LONG TERM GOAL #4    Title Pt will demo decreased R pelvic floor mm tightness across 2 visits in order to minimize pelvic pain and straining with bowel movements and optimize IAP system properly     Time 8     Period Weeks     Status Achieved          PT LONG TERM GOAL #5    Title Pt's FOTO score for Pain to decrease from 29pts to < 24pts and Urinary from 38 pts to < 30 pts and Bowel from 17 pts to < 12 pts and  Bowel Leakage improve 59 pts to > 64 pts     Baseline 11/8:Urinary  38 pts to 33 pts, PFDI Bowel 17pts to 4pts, bowel Leakage 59pts to 72 pts   10/22/21 :  Pain 29pt --> 4pts,  Urinary 38 pts--> 4pts, Bowel 18 pts-->0 pts, Leakage  59 pts --> 83 pts     Time 10     Period Weeks     Status Achieved          PT LONG TERM GOAL #6    Title Pt will report no urge to urinate when sitting or bending forward to put on his back pack  in order to attend meetings or leave the house     Baseline 10/12/21: 50%  improvement)  11/09/21: 60% improvement     Time 8     Period Weeks     Status Partially Met  03/29/2022             PT LONG TERM GOAL #7    Title Pt 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     Time 10     Period Weeks     Status Achieved          PT LONG TERM GOAL  #10    TITLE Pt will be able to hike for 14mles on low grade trail and/or 2 miles on a trail with inclines and report no pain .     Time 6     Period Months     Status Partially Met     Target Date 03/15/2022         PT LONG TERM GOAL  #11    TITLE Pt will maintain 2 cm of plantar arches and and no tightness at L ITband/ around GT and lateral leg, hypomobility at lateral foot with compliance with modification with tossing tennis balls repeated at work     Baseline (10/12/21:  2cm B)     Time 10     Period Weeks     Status Achieved     Target Date 10/12/21          PT LONG TERM GOAL  #12    TITLE Pt will report decreased urgency with incline walking in order to hike with family     Time 10     Period Weeks     Status On-going     Target Date 04/12/2022             PT LONG TERM GOAL  #13    TITLE Pt will demo no tensions nor tenderness at ischiocavernosus on L ( 2 o'clock position_) and at L ischial tuberosity across 2 visits     Status New     Target Date 12/21/21                     Plan -       Clinical Impression Statement   Plan to continue working with regional interdependent approach.  Pt continues to benefit from skilled PT       Examination-Activity Limitations Toileting;Sit     Stability/Clinical Decision Making Evolving/Moderate complexity     Rehab Potential Good     PT Frequency BiMonthy     PT Duration Other (comment)   5 months    PT Treatment/Interventions Neuromuscular  re-education;Patient/family  education;Therapeutic exercise;Scar mobilization;Taping;Manual techniques;Moist Heat;Therapeutic activities;Gait training;Balance training     Consulted and Agree with Plan of Care Patient                       Jerl Mina, PT

## 2022-03-15 NOTE — Patient Instructions (Addendum)
  Avoid straining pelvic floor, abdominal muscles , spine  Use log rolling technique instead of getting out of bed with your neck or the sit-up     Log rolling into and out of bed   Log rolling into and out of bed If getting out of bed on R side, Bent knees, scoot hips/ shoulder to L  Raise R arm completely overhead, rolling onto armpit  Then lower bent knees to bed to get into complete side lying position  Then drop legs off bed, and push up onto R elbow/forearm, and use L hand to push onto the bed    Dig elbows and feet to lift the buttocks and scoot without lifting head  __  Stretch for pelvic floor    On belly: Riding horse edge of mattress  knee bent like riding a horse, move knee towards armpit and out , dragging and not lifting 10 reps   ___  Schedule regular appts with Harriett Sine and if you can coordinate one appt in-person visit prior to PT  Schedule appts with Raynelle Fanning your psychotherapist regularly to ensure maintenance since you will stop appts with your life coach  __  Ambidexterious with tennis so the L back foot can be the planting foot   --  Applying Pelvic tilts:  Melting points of contact when on your back , knees bent,    __

## 2022-03-29 ENCOUNTER — Ambulatory Visit: Payer: 59 | Admitting: Physical Therapy

## 2022-03-29 DIAGNOSIS — G8929 Other chronic pain: Secondary | ICD-10-CM

## 2022-03-29 DIAGNOSIS — M533 Sacrococcygeal disorders, not elsewhere classified: Secondary | ICD-10-CM

## 2022-03-29 DIAGNOSIS — R350 Frequency of micturition: Secondary | ICD-10-CM

## 2022-03-29 DIAGNOSIS — R102 Pelvic and perineal pain: Secondary | ICD-10-CM

## 2022-03-29 DIAGNOSIS — R293 Abnormal posture: Secondary | ICD-10-CM

## 2022-03-29 DIAGNOSIS — R2689 Other abnormalities of gait and mobility: Secondary | ICD-10-CM

## 2022-03-29 NOTE — Therapy (Addendum)
OUTPATIENT PHYSICAL THERAPY TREATMENT NOTE   / Recert    Patient Name: Alec Reynolds MRN: [id number] DOB:09/23/1970, 52 y.o., male      REFERRING PROVIDER: [employer]night     PT End of Session - 03/29/22 1338     Visit Number 36    Date for PT Re-Evaluation 06/21/22   PN 05/09/20, 07/21/20, 12/29/20, 04/20/21, 11/09/21   Authorization Type 13/ 35 visits of PT./OT/ chiro per year    PT Start Time 1335    PT Stop Time 1440    PT Time Calculation (min) 65 min    Activity Tolerance Patient tolerated treatment well;No increased pain    Behavior During Therapy [facility] for tasks assessed/performed                 No past medical history on file.      Past Surgical History:  Procedure Laterality Date   ELBOW SURGERY Left     WRIST SURGERY            Patient Active Problem List    Diagnosis Date Noted   Excessive sleepiness 01/30/2021   Urinary frequency 12/23/2019   Pelvic pain 12/23/2019   Low back pain 12/22/2019      REFERRING DIAG:      Diagnosis Description  Pelvic Floor dysfunction      THERAPY DIAG:  Sacrococcygeal disorders, not elsewhere classified   Pelvic pain   Urinary frequency   Other abnormalities of gait and mobility   Chronic bilateral low back pain with left-sided sciatica   Abnormal posture   Rationale for Evaluation and Treatment Rehabilitation   PERTINENT HISTORY:  R groin injury 2017R , anxiety, stress Fx in R shin, low back as kid. 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 Pt also enjoyed cycling and stopped when the pain started. Pt used to cycle 2-3 hours 200 miles / day. Pt did not have a stretch routine. Pt completed Pelvic PT via telehealth one year and tried stretching but it made it worse on R groin injury 2017R , anxiety, stress Fx in R shin, low back as kid. 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 Pt  also enjoyed cycling and stopped when the pain started. Pt used to cycle 2-3 hours 200 miles / day. Pt did not have a stretch routine. Pt completed Pelvic PT via telehealth one year and tried stretching but it made it worse   PRECAUTIONS:    SUBJECTIVE:    Subjective Assessment -       Subjective Pt reported he noticed he is able to sit with 30% less pain since last session from 25%.   Urgency is improving as he is able to tolerate sitting longer.   Pt still has urgency but nothings less focus on needing to pee when he is distracted.  Pt was able to walk with wife for 3.2 miles with 80% improved urge.       Pertinent History R groin injury 2017 ,  anxiety, stress Fx in R shin, low back as kid. 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 Pt also enjoyed cycling and stopped when the pain started. Pt used to cycle 2-3 hours 200 miles / day. Pt did not have  a stretch routine. Pt completed Pelvic PT via telehealth one year and tried stretching but it made it worse.                         TODAY'S TREATMENT:                 [facility] PT Assessment - 03/29/22 1445       Floor to Stand   Comments hamstring flexibility present in downward facing dog      Other:   Other/Comments his sitting meditation position showed higher knees than hips, posterior tilt of pelvis               [facility] Adult PT Treatment/Exercise - 03/29/22 1500       Therapeutic Activites    Other Therapeutic Activities Provided and educated urge suppression technique, co-creating upcoming interdisciplinary goals to help prevent relapse with depression and to add a CAM practitioner onto this team      Neuro Re-ed    Neuro Re-ed Details  cued for modification with block to maintain anterior tilt of pelvis in his meditation sitting position , stand <> floor t/f                 PATIENT EDUCATION: Education details: showed anatomy of mm  attached by ischial tuberosity and explained correlation to his pain with functional tasks. Explained the role of decreased Wbing on RLE in simulated tennis movements and why hip abd/ ext were weaker on R with more overactivity on L side    Person educated: Patient Education method: Explanation, Demonstration, Tactile cues, Verbal cues, and Handouts   Education comprehension: verbalized understanding, returned demonstration, verbal cues required, tactile cues required, and needs further education     HOME EXERCISE PROGRAM: See pt instruction section            PT Long Term Goals -               PT LONG TERM GOAL #1    Title Pt will demo decreased abdominal separation from 4 fingers width  to < 1 fingers width to improve IAP system for pelvic floor function     Time 4     Period Weeks     Status Achieved          PT LONG TERM GOAL #2    Title Pt will demo improved posture with more anterior tilt of pelvis, increased lumbar lordosis, and no cues for proper sitting/ standing posture  in order to minimize CLBP and return to more functional activities     Time 6     Period Weeks     Status Achieved          PT LONG TERM GOAL #3    Title Pt will demo no spinal deviation and more levelled pelvic girdle across 2 visits in order to advance towards deep core/ thoracolumbar strengthening exercises     Time 2     Period Weeks     Status Achieved          PT LONG TERM GOAL #4    Title Pt will demo decreased R pelvic floor mm tightness across 2 visits in order to minimize pelvic pain and straining with bowel movements and optimize IAP system properly     Time 8     Period Weeks     Status Achieved          PT LONG TERM  GOAL #5    Title Pt's FOTO score for Pain to decrease from 29pts to < 24pts and Urinary from 38 pts to < 30 pts and Bowel from 17 pts to < 12 pts and  Bowel Leakage improve 59 pts to > 64 pts     Baseline 11/8:Urinary  38 pts to 33 pts, PFDI Bowel 17pts to 4pts, bowel  Leakage 59pts to 72 pts   10/22/21 :  Pain 29pt --> 4pts,  Urinary 38 pts--> 4pts, Bowel 18 pts-->0 pts, Leakage  59 pts --> 83 pts     Time 10     Period Weeks     Status Achieved          PT LONG TERM GOAL #6    Title Pt will report no urge to urinate when sitting or bending forward to put on his back pack  in order to attend meetings or leave the house     Baseline 10/12/21: 50% improvement)  11/09/21: 60% improvement     Time 8     Period Weeks     Status Partially Met  03/29/2022             PT LONG TERM GOAL #7    Title Pt 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     Time 10     Period Weeks     Status Achieved          PT LONG TERM GOAL  #10    TITLE Pt will be able to hike for 12mles on low grade trail and/or 2 miles on a trail with inclines and report no pain .     Time 6     Period Months     Status Partially Met     Target Date 03/15/2022         PT LONG TERM GOAL  #11    TITLE Pt will maintain 2 cm of plantar arches and and no tightness at L ITband/ around GT and lateral leg, hypomobility at lateral foot with compliance with modification with tossing tennis balls repeated at work     Baseline (10/12/21:  2cm B)     Time 10     Period Weeks     Status Achieved     Target Date 10/12/21          PT LONG TERM GOAL  #12    TITLE Pt will report decreased urgency with incline walking in order to hike with family      Time 10     Period Weeks     Status  On-going     Target Date 04/12/2022             PT LONG TERM GOAL  #13    TITLE Pt will demo no tensions nor tenderness at ischiocavernosus on L ( 2 o'clock position_) and at L ischial tuberosity across 2 visits     Status New   -10 weeks     Target Date 06/21/22                      Plan -       Clinical Impression Statement Pt achieved 9/13 goals and progress well towards remaining goals.   Pt's endurance to stand and play tennis at work with less urgency and pelvic pain is improving. Pt also noticed urge Suppression technique with modification to relax pelvic floor 5 x to further address urgency with sitting in chairs.   Cued for modification with block to maintain anterior tilt of pelvis in his meditation sitting position , stand <> floor t/f.  Co-creating upcoming interdisciplinary goals to help prevent relapse with depression and to add a CAM practitioner onto this team.  Plan to reassess pelvic floor and ankle strength and anterior pelvic floor mm at next session.  Pt continues to benefit from skilled PT       Examination-Activity Limitations Toileting;Sit     Stability/Clinical Decision Making Evolving/Moderate complexity     Rehab Potential Good     PT Frequency BiMonthy     PT Duration Other (comment)   5 months    PT Treatment/Interventions Neuromuscular re-education;Patient/family education;Therapeutic exercise;Scar mobilization;Taping;Manual techniques;Moist Heat;Therapeutic activities;Gait training;Balance training     Consulted and Agree with Plan of Care Patient                       Jerl Mina, PT

## 2022-03-29 NOTE — Patient Instructions (Addendum)
URGE SUPPRESION TECHNIQUES  These techniques are to be used to suppress those abnormally strong URGES to urinate, especially after you have already urinated < 1hr ago.  These steps do not have to be followed in order, and not all steps have to be used.   The purpose of these steps is to help you regain control of your bladder, to reduce the amount of urinary urgency, frequency, or leaking.  They take practice to master in controlling urgency.  Allow yourself to be okay with leaking when you first start practicing these steps. Practice these steps first at home, when you do not have to worry as much about leaking.  SIT DOWN.  Pressure on the pelvic floor inhibits the bladder.  Further pressure may help, such as sitting on a small rolled up towel. RELAX pelvic floor   5 breaths  BREATHE & STAY CALM.  Breathing slowly and remaining calm will inhibit your sympathetic nervous system, which will in turn calm the bladder.   DISTRACTION.  Sit with a project that will engage your mind.  Anything that works for you - reading, word puzzles, crochet, knitting, checking email, balancing the checkbook, and so on. VISUALIZATION.  Imagine that you are in a place/situation in which either you cannot or do not want to leave.  Examples: in a car and cannot stop; lying on a beach with a far walk to a restroom; at dinner with someone special.  If the urge persists after practicing these steps and feel you must go to the bathroom, then it is imperative that you . . . . Walk slowly and calmly to the bathroom Maintain calm breathing Refrain from undressing until you are standing over the toilet Rushing to the restroom will only encourage the strong bladder urges and leaking.  Again, the more you practice, the easier these steps will become.  __  Sitting on yoga blocks and blanklet to make knees lower than hips ( pillows under knees/ leg)  When you meditate  __  "Ooo" as in road ( deep sound)    ___      Transition from standing to floor :  stand to floor transfer :      _ slow     _ mini squat      _ crawl down with one hand on thigh      _downward dog  - >  shoulders down and back-  walk the dog ( knee bents to lengthe hamstrings)      Floor to stand :   downward dog   Feet are wider than hips, crawl hands back, butt is back, knees behind toes -> squat  Hands at waist , elbows back, chest lifts

## 2022-04-12 ENCOUNTER — Ambulatory Visit: Payer: 59 | Attending: Physician Assistant | Admitting: Physical Therapy

## 2022-04-12 DIAGNOSIS — M5442 Lumbago with sciatica, left side: Secondary | ICD-10-CM | POA: Diagnosis not present

## 2022-04-12 DIAGNOSIS — R293 Abnormal posture: Secondary | ICD-10-CM | POA: Diagnosis not present

## 2022-04-12 DIAGNOSIS — R2689 Other abnormalities of gait and mobility: Secondary | ICD-10-CM | POA: Insufficient documentation

## 2022-04-12 DIAGNOSIS — R350 Frequency of micturition: Secondary | ICD-10-CM | POA: Diagnosis not present

## 2022-04-12 DIAGNOSIS — G8929 Other chronic pain: Secondary | ICD-10-CM | POA: Diagnosis not present

## 2022-04-12 DIAGNOSIS — M533 Sacrococcygeal disorders, not elsewhere classified: Secondary | ICD-10-CM | POA: Insufficient documentation

## 2022-04-12 DIAGNOSIS — R102 Pelvic and perineal pain: Secondary | ICD-10-CM | POA: Insufficient documentation

## 2022-04-12 NOTE — Therapy (Signed)
OUTPATIENT PHYSICAL THERAPY TREATMENT NOTE      Patient Name: Alec Reynolds MRN: [id number] DOB:26-Aug-1970, 51 y.o., male      REFERRING PROVIDER: [employer]     PT End of Session - 04/12/22 1339     Visit Number 23    Date for PT Re-Evaluation 06/21/22   PN 05/09/20, 07/21/20, 12/29/20, 04/20/21, 11/09/21   Authorization Type 14/ 35 visits of PT./OT/ chiro per year    PT Start Time 1335    PT Stop Time 1420    PT Time Calculation (min) 45 min    Activity Tolerance Patient tolerated treatment well;No increased pain    Behavior During Therapy [facility] for tasks assessed/performed                 No past medical history on file.      Past Surgical History:  Procedure Laterality Date   ELBOW SURGERY Left     WRIST SURGERY            Patient Active Problem List    Diagnosis Date Noted   Excessive sleepiness 01/30/2021   Urinary frequency 12/23/2019   Pelvic pain 12/23/2019   Low back pain 12/22/2019      REFERRING DIAG:      Diagnosis Description  Pelvic Floor dysfunction      THERAPY DIAG:  Sacrococcygeal disorders, not elsewhere classified   Pelvic pain   Urinary frequency   Other abnormalities of gait and mobility   Chronic bilateral low back pain with left-sided sciatica   Abnormal posture   Rationale for Evaluation and Treatment Rehabilitation   PERTINENT HISTORY:  R groin injury 2017R , anxiety, stress Fx in R shin, low back as kid. 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 Pt also enjoyed cycling and stopped when the pain started. Pt used to cycle 2-3 hours 200 miles / day. Pt did not have a stretch routine. Pt completed Pelvic PT via telehealth one year and tried stretching but it made it worse on R groin injury 2017R , anxiety, stress Fx in R shin, low back as kid. 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 Pt also  enjoyed cycling and stopped when the pain started. Pt used to cycle 2-3 hours 200 miles / day. Pt did not have a stretch routine. Pt completed Pelvic PT via telehealth one year and tried stretching but it made it worse   PRECAUTIONS:    SUBJECTIVE:    Subjective Assessment -       Subjective Pt reported he ran out out his Diazepam medication for 5 days and he noticed increased urgency and pain at the base of testicles and L buttock area and urethra area.Pt started back on it yesterday and he knows it takes several days to get back on it.    Pt tried the toning exercise and he found it to calm his sympathetic nn system. Pt tried it 3 x.        Pertinent History R groin injury 2017 ,  anxiety, stress Fx in R shin, low back as kid. 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 Pt also enjoyed cycling and stopped when the pain started. Pt used to cycle 2-3 hours 200 miles /  day. Pt did not have a stretch routine. Pt completed Pelvic PT via telehealth one year and tried stretching but it made it worse.                         TODAY'S TREATMENT:                 [facility] PT Assessment - 04/13/22 1231       Observation/Other Assessments   Observations slumped sitting, reported decreased pelvic pain when cued for anterior tilt of pelvis             [facility] Adult PT Treatment/Exercise - 04/13/22 1231       Therapeutic Activites    Other Therapeutic Activities active listening to pt's relapse of pain/ Sx when did not take his medications. Provided more encouragement. EXplained anterior tilt of pelvis for sitting with less pain      Manual Therapy   Manual therapy comments pubic symphysis tightness and pain with posterior tilt of pelvis, decreased with anterior tilt of pelvis  , pt was concerned about the pain wher he pointed to spermatic cord at inguinal area .                 PATIENT EDUCATION: Education details:  showed anatomy of mm attached by ischial tuberosity and explained correlation to his pain with functional tasks. Explained the role of decreased Wbing on RLE in simulated tennis movements and why hip abd/ ext were weaker on R with more overactivity on L side    Person educated: Patient Education method: Explanation, Demonstration, Tactile cues, Verbal cues, and Handouts   Education comprehension: verbalized understanding, returned demonstration, verbal cues required, tactile cues required, and needs further education     HOME EXERCISE PROGRAM: See pt instruction section            PT Long Term Goals -               PT LONG TERM GOAL #1    Title Pt will demo decreased abdominal separation from 4 fingers width  to < 1 fingers width to improve IAP system for pelvic floor function     Time 4     Period Weeks     Status Achieved          PT LONG TERM GOAL #2    Title Pt will demo improved posture with more anterior tilt of pelvis, increased lumbar lordosis, and no cues for proper sitting/ standing posture  in order to minimize CLBP and return to more functional activities     Time 6     Period Weeks     Status Achieved          PT LONG TERM GOAL #3    Title Pt will demo no spinal deviation and more levelled pelvic girdle across 2 visits in order to advance towards deep core/ thoracolumbar strengthening exercises     Time 2     Period Weeks     Status Achieved          PT LONG TERM GOAL #4    Title Pt will demo decreased R pelvic floor mm tightness across 2 visits in order to minimize pelvic pain and straining with bowel movements and optimize IAP system properly     Time 8     Period Weeks     Status Achieved          PT LONG TERM GOAL #  5    Title Pt's FOTO score for Pain to decrease from 29pts to < 24pts and Urinary from 38 pts to < 30 pts and Bowel from 17 pts to < 12 pts and  Bowel Leakage improve 59 pts to > 64 pts     Baseline 11/8:Urinary  38 pts to 33 pts, PFDI Bowel  17pts to 4pts, bowel Leakage 59pts to 72 pts   10/22/21 :  Pain 29pt --> 4pts,  Urinary 38 pts--> 4pts, Bowel 18 pts-->0 pts, Leakage  59 pts --> 83 pts     Time 10     Period Weeks     Status Achieved          PT LONG TERM GOAL #6    Title Pt will report no urge to urinate when sitting or bending forward to put on his back pack  in order to attend meetings or leave the house     Baseline 10/12/21: 50% improvement)  11/09/21: 60% improvement     Time 8     Period Weeks     Status Partially Met  03/29/2022             PT LONG TERM GOAL #7    Title Pt 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     Time 10     Period Weeks     Status Achieved          PT LONG TERM GOAL  #10    TITLE Pt will be able to hike for 11mles on low grade trail and/or 2 miles on a trail with inclines and report no pain .     Time 6     Period Months     Status Partially Met     Target Date 03/15/2022         PT LONG TERM GOAL  #11    TITLE Pt will maintain 2 cm of plantar arches and and no tightness at L ITband/ around GT and lateral leg, hypomobility at lateral foot with compliance with modification with tossing tennis balls repeated at work     Baseline (10/12/21:  2cm B)     Time 10     Period Weeks     Status Achieved     Target Date 10/12/21          PT LONG TERM GOAL  #12    TITLE Pt will report decreased urgency with incline walking in order  to hike with family     Time 10     Period Weeks     Status On-going  Target Date 04/12/2022             PT LONG TERM GOAL  #13    TITLE Pt will demo no tensions nor tenderness at ischiocavernosus on L   ( 2 o'clock position_) and at L ischial tuberosity across 2 visits     Status New     Target Date 12/21/21                     Plan -       Clinical Impression Statement Pt required cues for pelvic propioception in anterior tilt with sitting and hooklying. Pt gained awareness for decreasing  pelvic floor tenderness and pain by gaining anterior tilt and minimize excessive posterior tilt of pelvic floor which his preferred sitting posture.  Plan to continue with more propioception training Pt continues to benefit from skilled PT       Examination-Activity Limitations Toileting;Sit     Stability/Clinical Decision Making Evolving/Moderate complexity     Rehab Potential Good     PT Frequency BiMonthy     PT Duration Other (comment)   5 months    PT Treatment/Interventions Neuromuscular re-education;Patient/family education;Therapeutic exercise;Scar mobilization;Taping;Manual techniques;Moist Heat;Therapeutic activities;Gait training;Balance training     Consulted and Agree with Plan of Care Patient                       Jerl Mina, PT

## 2022-04-13 NOTE — Patient Instructions (Addendum)
Anatomy images of spermatic cord   Anterior ./posterior tilt of pelvis within less range   Upright sitting in anterior tilt

## 2022-04-26 ENCOUNTER — Ambulatory Visit: Payer: 59 | Admitting: Physical Therapy

## 2022-05-10 ENCOUNTER — Ambulatory Visit: Payer: 59 | Attending: Physician Assistant | Admitting: Physical Therapy

## 2022-05-10 DIAGNOSIS — M4125 Other idiopathic scoliosis, thoracolumbar region: Secondary | ICD-10-CM | POA: Diagnosis not present

## 2022-05-10 DIAGNOSIS — R102 Pelvic and perineal pain: Secondary | ICD-10-CM | POA: Insufficient documentation

## 2022-05-10 DIAGNOSIS — R293 Abnormal posture: Secondary | ICD-10-CM | POA: Insufficient documentation

## 2022-05-10 DIAGNOSIS — R2689 Other abnormalities of gait and mobility: Secondary | ICD-10-CM | POA: Insufficient documentation

## 2022-05-10 DIAGNOSIS — M5442 Lumbago with sciatica, left side: Secondary | ICD-10-CM | POA: Insufficient documentation

## 2022-05-10 DIAGNOSIS — G8929 Other chronic pain: Secondary | ICD-10-CM | POA: Insufficient documentation

## 2022-05-10 DIAGNOSIS — M533 Sacrococcygeal disorders, not elsewhere classified: Secondary | ICD-10-CM | POA: Diagnosis not present

## 2022-05-10 DIAGNOSIS — R350 Frequency of micturition: Secondary | ICD-10-CM | POA: Diagnosis not present

## 2022-05-10 NOTE — Therapy (Signed)
OUTPATIENT PHYSICAL THERAPY TREATMENT NOTE      Patient Name: Alec Reynolds MRN: [id number] DOB:1971/04/25, 51 y.o., male      REFERRING PROVIDER: [employer]night     PT End of Session - 05/10/22 1351     Visit Number 59    Date for PT Re-Evaluation 06/21/22   PN 05/09/20, 07/21/20, 12/29/20, 04/20/21, 11/09/21   Authorization Type 15/ 35 visits of PT./OT/ chiro per year    PT Start Time 1332    PT Stop Time 1420    PT Time Calculation (min) 48 min    Activity Tolerance Patient tolerated treatment well;No increased pain    Behavior During Therapy [facility] for tasks assessed/performed                 No past medical history on file.      Past Surgical History:  Procedure Laterality Date   ELBOW SURGERY Left     WRIST SURGERY            Patient Active Problem List    Diagnosis Date Noted   Excessive sleepiness 01/30/2021   Urinary frequency 12/23/2019   Pelvic pain 12/23/2019   Low back pain 12/22/2019      REFERRING DIAG:      Diagnosis Description  Pelvic Floor dysfunction      THERAPY DIAG:  Sacrococcygeal disorders, not elsewhere classified   Pelvic pain   Urinary frequency   Other abnormalities of gait and mobility   Chronic bilateral low back pain with left-sided sciatica   Abnormal posture   Rationale for Evaluation and Treatment Rehabilitation   PERTINENT HISTORY:  R groin injury 2017R , anxiety, stress Fx in R shin, low back as kid. 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 Pt also enjoyed cycling and stopped when the pain started. Pt used to cycle 2-3 hours 200 miles / day. Pt did not have a stretch routine. Pt completed Pelvic PT via telehealth one year and tried stretching but it made it worse on R groin injury 2017R , anxiety, stress Fx in R shin, low back as kid. 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 Pt also  enjoyed cycling and stopped when the pain started. Pt used to cycle 2-3 hours 200 miles / day. Pt did not have a stretch routine. Pt completed Pelvic PT via telehealth one year and tried stretching but it made it worse   PRECAUTIONS:    SUBJECTIVE:    Subjective Assessment -       Subjective Pt reported he ran out out his Diazepam medication for 5 days and he noticed increased urgency and pain at the base of testicles and L buttock area and urethra area.Pt started back on it yesterday and he knows it takes several days to get back on it.    Pt tried the toning exercise and he found it to calm his sympathetic nn system. Pt tried it 3 x.        Pertinent History R groin injury 2017 ,  anxiety, stress Fx in R shin, low back as kid. 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 Pt also enjoyed cycling and stopped when the pain started. Pt used to cycle 2-3 hours 200 miles /  day. Pt did not have a stretch routine. Pt completed Pelvic PT via telehealth one year and tried stretching but it made it worse.                         TODAY'S TREATMENT:                   [facility] PT Treatment/Exercise - 05/10/22 1534       Therapeutic Activites    Other Therapeutic Activities active listening to pt's news that his dad died 3 weeks ago , provided encouragement with self-care and to make appts with psychotherapist to support grief process      Neuro Re-ed    Neuro Re-ed Details  cued for relaxation of pelvic floor      Manual Therapy   Internal Pelvic Floor assessment and biofeedback to relax pelvic floor                 PATIENT EDUCATION: Education details: showed anatomy of mm attached by ischial tuberosity and explained correlation to his pain with functional tasks. Explained the role of decreased Wbing on RLE in simulated tennis movements and why hip abd/ ext were weaker on R with more overactivity on L side     Person educated: Patient Education method: Explanation, Demonstration, Tactile cues, Verbal cues, and Handouts   Education comprehension: verbalized understanding, returned demonstration, verbal cues required, tactile cues required, and needs further education     HOME EXERCISE PROGRAM: See pt instruction section            PT Long Term Goals -               PT LONG TERM GOAL #1    Title Pt will demo decreased abdominal separation from 4 fingers width  to < 1 fingers width to improve IAP system for pelvic floor function     Time 4     Period Weeks     Status Achieved          PT LONG TERM GOAL #2    Title Pt will demo improved posture with more anterior tilt of pelvis, increased lumbar lordosis, and no cues for proper sitting/ standing posture  in order to minimize CLBP and return to more functional activities     Time 6     Period Weeks     Status Achieved          PT LONG TERM GOAL #3    Title Pt will demo no spinal deviation and more levelled pelvic girdle across 2 visits in order to advance towards deep core/ thoracolumbar strengthening exercises     Time 2     Period Weeks     Status Achieved          PT LONG TERM GOAL #4    Title Pt will demo decreased R pelvic floor mm tightness across 2 visits in order to minimize pelvic pain and straining with bowel movements and optimize IAP system properly     Time 8     Period Weeks     Status Achieved          PT LONG TERM GOAL #5    Title Pt's FOTO score for Pain to decrease from 29pts to < 24pts and Urinary from 38 pts to < 30 pts and Bowel from 17 pts to < 12 pts and  Bowel Leakage improve 59 pts to > 64 pts  Baseline 11/8:Urinary  38 pts to 33 pts, PFDI Bowel 17pts to 4pts, bowel Leakage 59pts to 72 pts   10/22/21 :  Pain 29pt --> 4pts,  Urinary 38 pts--> 4pts, Bowel 18 pts-->0 pts, Leakage  59 pts --> 83 pts     Time 10     Period Weeks     Status Achieved          PT LONG TERM GOAL #6    Title Pt will report no  urge to urinate when sitting or bending forward to put on his back pack  in order to attend meetings or leave the house     Baseline 10/12/21: 50% improvement)  11/09/21: 60% improvement     Time 8     Period Weeks     Status Partially Met  03/29/2022             PT LONG TERM GOAL #7    Title Pt 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     Time 10     Period Weeks     Status Achieved          PT LONG TERM GOAL  #10    TITLE Pt will be able to hike for 81mles on low grade trail and/or 2 miles on a trail with inclines and report no pain .     Time 6     Period Months     Status Partially Met     Target Date 03/15/2022         PT LONG TERM GOAL  #11    TITLE Pt will maintain 2 cm of plantar arches and and no tightness at L ITband/ around GT and lateral leg, hypomobility at lateral foot with compliance with modification with tossing tennis balls repeated at work     Baseline (10/12/21:  2cm B)     Time 10     Period Weeks     Status Achieved     Target Date 10/12/21          PT LONG TERM GOAL  #12    TITLE Pt will report decreased urgency with incline walking in order to hike with family     Time 10     Period Weeks     Status On-going     Target Date 04/12/2022             PT LONG TERM GOAL  #13    TITLE Pt will demo no tensions nor tenderness at ischiocavernosus on L   ( 2 o'clock position_) and at L ischial  tuberosity across 2 visits     Status New     Target Date 12/21/21                     Plan -       Clinical Impression Statement Pt showed decreased pelvic floor mm tensions compared to past sessions. Pt discontinued using therawand which was prescribed by another pelvic PT in the past.   Provided active listening to pt's news that his dad died 3 weeks ago. Provided encouragement with self-care and to make appts with psychotherapist to support grief process. Pt voiced understanding.   Pt continues to benefit from skilled PT       Examination-Activity Limitations Toileting;Sit     Stability/Clinical Decision Making Evolving/Moderate complexity     Rehab Potential Good     PT Frequency BiMonthy     PT Duration Other (comment)   5 months    PT Treatment/Interventions Neuromuscular re-education;Patient/family education;Therapeutic exercise;Scar mobilization;Taping;Manual techniques;Moist Heat;Therapeutic activities;Gait training;Balance training     Consulted and Agree with Plan of Care Patient                       Jerl Mina, PT

## 2022-05-17 ENCOUNTER — Ambulatory Visit: Payer: 59 | Admitting: Physical Therapy

## 2022-05-22 ENCOUNTER — Encounter: Payer: 59 | Admitting: Physical Therapy

## 2022-06-07 ENCOUNTER — Ambulatory Visit: Payer: 59 | Attending: Physician Assistant | Admitting: Physical Therapy

## 2022-06-07 DIAGNOSIS — M4125 Other idiopathic scoliosis, thoracolumbar region: Secondary | ICD-10-CM | POA: Diagnosis not present

## 2022-06-07 DIAGNOSIS — M533 Sacrococcygeal disorders, not elsewhere classified: Secondary | ICD-10-CM | POA: Insufficient documentation

## 2022-06-07 DIAGNOSIS — R102 Pelvic and perineal pain: Secondary | ICD-10-CM | POA: Diagnosis not present

## 2022-06-07 DIAGNOSIS — G8929 Other chronic pain: Secondary | ICD-10-CM | POA: Insufficient documentation

## 2022-06-07 DIAGNOSIS — R350 Frequency of micturition: Secondary | ICD-10-CM | POA: Insufficient documentation

## 2022-06-07 DIAGNOSIS — M5442 Lumbago with sciatica, left side: Secondary | ICD-10-CM | POA: Diagnosis not present

## 2022-06-07 DIAGNOSIS — R2689 Other abnormalities of gait and mobility: Secondary | ICD-10-CM | POA: Insufficient documentation

## 2022-06-07 DIAGNOSIS — R293 Abnormal posture: Secondary | ICD-10-CM | POA: Insufficient documentation

## 2022-06-07 NOTE — Therapy (Signed)
OUTPATIENT PHYSICAL THERAPY TREATMENT NOTE  / Progress Note across 10 visits from Visit 50 to 60 (11/09/21 to 06/07/22)      Patient Name: Alec Reynolds MRN: [id number] DOB:June 10, 1971, 51 y.o., male      REFERRING PROVIDER: [employer]night     PT End of Session - 06/07/22 1348     Visit Number 60    Date for PT Re-Evaluation 06/21/22   PN 05/09/20, 07/21/20, 12/29/20, 04/20/21, 11/09/21, 06/07/22   Authorization Type 16/ 35 visits of PT./OT/ chiro per year    PT Start Time 0315    PT Stop Time 1420    PT Time Calculation (min) 43 min    Activity Tolerance Patient tolerated treatment well;No increased pain    Behavior During Therapy [facility] for tasks assessed/performed                 No past medical history on file.      Past Surgical History:  Procedure Laterality Date   ELBOW SURGERY Left     WRIST SURGERY            Patient Active Problem List    Diagnosis Date Noted   Excessive sleepiness 01/30/2021   Urinary frequency 12/23/2019   Pelvic pain 12/23/2019   Low back pain 12/22/2019      REFERRING DIAG:      Diagnosis Description  Pelvic Floor dysfunction      THERAPY DIAG:  Sacrococcygeal disorders, not elsewhere classified   Pelvic pain   Urinary frequency   Other abnormalities of gait and mobility   Chronic bilateral low back pain with left-sided sciatica   Abnormal posture   Rationale for Evaluation and Treatment Rehabilitation   PERTINENT HISTORY:  R groin injury 2017R , anxiety, stress Fx in R shin, low back as kid. 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 Pt also enjoyed cycling and stopped when the pain started. Pt used to cycle 2-3 hours 200 miles / day. Pt did not have a stretch routine. Pt completed Pelvic PT via telehealth one year and tried stretching but it made it worse on R groin injury 2017R , anxiety, stress Fx in R shin, low back as kid. 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 Pt also enjoyed cycling and stopped when the pain started. Pt used to cycle 2-3 hours 200 miles / day. Pt did not have a stretch routine. Pt completed Pelvic PT via telehealth one year and tried stretching but it made it worse   PRECAUTIONS:    SUBJECTIVE:    Subjective Assessment -       Subjective Pt reported he noticed R pelvic pain over the last month. More urinary frequency with sitting. There is pain at base of L penis.  Pt observed he has been more stressed over the past month. Pt noticed his sympathetic system off the charts. His [place] math score is lower. Pt is not freaking out.   Pt has reached out to his psychotherapist and will have an appt on Monday.  Pt has noticed he is less stressed when he gets up early and then get off-court stuff done. Pt notices he is more peaceful in the early time. He finds it easier to calm it done if he gets up early and have a structured morning.   Pt  went to the gym and went on elliptical machine stair machine, he broke a sweat and he felt really good. Exercise has been his stress relief since his pelvic pain issues for the past 2 years.       Pertinent History R groin injury 2017 ,  anxiety, stress Fx in R shin, low back as kid. 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 Pt also enjoyed cycling and stopped when the pain started. Pt used to cycle 2-3 hours 200 miles / day. Pt did not have a stretch routine. Pt completed Pelvic PT via telehealth one year and tried stretching but it made it worse.                         TODAY'S TREATMENT:                 Pelvic Floor Special Questions - 06/07/22 1713     Pelvic Floor Internal Exam pt consented verbally and had no contraindications    Exam Type Rectal    Palpation posterior tilt of pelvis, tightness at R iliococcygeus , less pain with cue for anterior  tilt, tightness at puborectalis R             [facility] Adult PT Treatment/Exercise - 06/08/22 1053       Neuro Re-ed    Neuro Re-ed Details  cued for anterior tilt of pelvis and explained rationale. cued for upright sitting posture      Manual Therapy   Internal Pelvic Floor STM at R pelvic floor posterior                PATIENT EDUCATION: Education details: showed anatomy of mm attached by ischial tuberosity and explained correlation to his pain with functional tasks. Explained the role of decreased Wbing on RLE in simulated tennis movements and why hip abd/ ext were weaker on R with more overactivity on L side    Person educated: Patient Education method: Explanation, Demonstration, Tactile cues, Verbal cues, and Handouts   Education comprehension: verbalized understanding, returned demonstration, verbal cues required, tactile cues required, and needs further education     HOME EXERCISE PROGRAM: See pt instruction section            PT Long Term Goals -               PT LONG TERM GOAL #1    Title Pt will demo decreased abdominal separation from 4 fingers width  to < 1 fingers width to improve IAP system for pelvic floor function     Time 4     Period Weeks     Status Achieved          PT LONG TERM GOAL #2    Title Pt will demo improved posture with more anterior tilt of pelvis, increased lumbar lordosis, and no cues for proper sitting/ standing posture  in order to minimize CLBP and return to more functional activities     Time 6     Period Weeks     Status Achieved          PT LONG TERM GOAL #3    Title Pt will demo no spinal deviation and more levelled pelvic girdle across 2 visits in order to advance towards deep core/ thoracolumbar strengthening exercises     Time 2     Period Weeks  Status Achieved          PT LONG TERM GOAL #4    Title Pt will demo decreased R pelvic floor mm tightness across 2 visits in order to minimize pelvic pain and straining  with bowel movements and optimize IAP system properly     Time 8     Period Weeks     Status Achieved          PT LONG TERM GOAL #5    Title Pt's FOTO score for Pain to decrease from 29pts to < 24pts and Urinary from 38 pts to < 30 pts and Bowel from 17 pts to < 12 pts and  Bowel Leakage improve 59 pts to > 64 pts     Baseline 11/8:Urinary  38 pts to 33 pts, PFDI Bowel 17pts to 4pts, bowel Leakage 59pts to 72 pts   10/22/21 :  Pain 29pt --> 4pts,  Urinary 38 pts--> 4pts, Bowel 18 pts-->0 pts, Leakage  59 pts --> 83 pts     Time 10     Period Weeks     Status Achieved          PT LONG TERM GOAL #6    Title Pt will report no urge to urinate when sitting or bending forward to put on his back pack  in order to attend meetings or leave the house     Baseline 10/12/21: 50% improvement)  11/09/21: 60% improvement     Time 8     Period Weeks     Status Partially Met  03/29/2022             PT LONG TERM GOAL #7    Title Pt 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     Time 10     Period Weeks     Status Achieved          PT LONG TERM GOAL  #10    TITLE Pt will be able to hike for 35mles on low grade trail and/or 2 miles on a trail with inclines and report no pain .     Time 6     Period Months     Status Partially Met     Target Date 03/15/2022         PT LONG TERM GOAL  #11    TITLE Pt will maintain 2 cm of plantar arches and  and no tightness at L ITband/ around GT and lateral leg, hypomobility at lateral foot with compliance with modification with tossing tennis balls repeated at work     Baseline (10/12/21:  2cm B)     Time 10     Period Weeks     Status Achieved     Target Date 10/12/21          PT LONG TERM GOAL  #12    TITLE Pt will report decreased urgency with incline walking in order to hike with family     Time 10     Period Weeks     Status On-going     Target Date 08/16/21             PT LONG TERM GOAL  #13    TITLE Pt will demo no tensions nor tenderness at ischiocavernosus on L   ( 2 o'clock position_) and at L ischial tuberosity across 2 visits     Status On going     Target Date 08/16/2022                      Plan -       Clinical Impression Statement Pt achieved 9/13 goals and progressing well towards remaining goals. Current focus is guiding pt to gym activities. Advised pt to not use the step climber due to pt's c/o R pelvic floor pain and demo'd tighter R posterior pelvic floor mm / hamstrings.  Advised to select other types of elliptical machine for cardio which he finds to be helpful with stress relief. Plan to bring pt into gym and guide pt with a routine that will minimize overactivity of pelvic floor.   Pt experienced a stressful month with work load and Sx increased but pt noticed he was not as anxious about them. Pt voiced he has an appointment scheduled with his psychotherapist.   Pt continues to benefit from skilled PT       Examination-Activity Limitations Toileting;Sit     Stability/Clinical Decision Making Evolving/Moderate complexity     Rehab Potential Good     PT Frequency BiMonthy     PT Duration Other (comment)   5 months    PT Treatment/Interventions Neuromuscular re-education;Patient/family education;Therapeutic exercise;Scar mobilization;Taping;Manual techniques;Moist Heat;Therapeutic activities;Gait training;Balance training     Consulted and Agree with  Plan of Care Patient                       Jerl Mina, PT

## 2022-06-07 NOTE — Patient Instructions (Signed)
Choose the elliptical that is not stepping  Circular or ski  Try no more than 25 min  Slow ,  peak, cool down within the 25 min      ___   LAYING on Back Use upper arms and elbows for stability when pulling strap Opposite knee bent and foot firm in align with hip   Strap on [place]:  Hamstring _knee bends  10 reps  With knee pointing straight ( slightly to outside to minimize snapping sensation)      10 reps with knee pointing out towards armpit ( notice the stretch in the medial hamstring muscle)    __  Keep up with Alec Reynolds your psychotherapist for stress management

## 2022-06-08 DIAGNOSIS — R69 Illness, unspecified: Secondary | ICD-10-CM | POA: Diagnosis not present

## 2022-06-08 DIAGNOSIS — R102 Pelvic and perineal pain: Secondary | ICD-10-CM | POA: Diagnosis not present

## 2022-06-14 ENCOUNTER — Ambulatory Visit: Payer: 59 | Admitting: Physical Therapy

## 2022-06-14 DIAGNOSIS — R2689 Other abnormalities of gait and mobility: Secondary | ICD-10-CM | POA: Diagnosis not present

## 2022-06-14 DIAGNOSIS — R350 Frequency of micturition: Secondary | ICD-10-CM

## 2022-06-14 DIAGNOSIS — M533 Sacrococcygeal disorders, not elsewhere classified: Secondary | ICD-10-CM

## 2022-06-14 DIAGNOSIS — R102 Pelvic and perineal pain: Secondary | ICD-10-CM | POA: Diagnosis not present

## 2022-06-14 DIAGNOSIS — G8929 Other chronic pain: Secondary | ICD-10-CM | POA: Diagnosis not present

## 2022-06-14 DIAGNOSIS — M5442 Lumbago with sciatica, left side: Secondary | ICD-10-CM | POA: Diagnosis not present

## 2022-06-14 DIAGNOSIS — M4125 Other idiopathic scoliosis, thoracolumbar region: Secondary | ICD-10-CM | POA: Diagnosis not present

## 2022-06-14 DIAGNOSIS — R293 Abnormal posture: Secondary | ICD-10-CM | POA: Diagnosis not present

## 2022-06-14 NOTE — Therapy (Signed)
OUTPATIENT PHYSICAL THERAPY TREATMENT NOTE    Patient Name: Alec Reynolds MRN: [id number] DOB:[birth year], 51 y.o., male      REFERRING PROVIDER: [facility]     PT End of Session - 06/14/22 1507     Visit Number [id number]    Date for PT Re-Evaluation 06/21/22   PN 05/09/20, 07/21/20, 12/29/20, 04/20/21, 11/09/21, 06/07/22   Authorization Type 17/ 35 visits of PT./OT/ chiro per year    PT Start Time 1504    PT Stop Time 1545    PT Time Calculation (min) 41 min    Activity Tolerance Patient tolerated treatment well;No increased pain    Behavior During Therapy [facility] for tasks assessed/performed                 No past medical history on file.      Past Surgical History:  Procedure Laterality Date   ELBOW SURGERY Left     WRIST SURGERY            Patient Active Problem List    Diagnosis Date Noted   Excessive sleepiness 01/30/2021   Urinary frequency 12/23/2019   Pelvic pain 12/23/2019   Low back pain 12/22/2019      REFERRING DIAG:      Diagnosis Description  Pelvic Floor dysfunction      THERAPY DIAG:  Sacrococcygeal disorders, not elsewhere classified   Pelvic pain   Urinary frequency   Other abnormalities of gait and mobility   Chronic bilateral low back pain with left-sided sciatica   Abnormal posture   Rationale for Evaluation and Treatment Rehabilitation   PERTINENT HISTORY:  R groin injury 2017R , anxiety, stress Fx in R shin, low back as kid. 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 Pt also enjoyed cycling and stopped when the pain started. Pt used to cycle 2-3 hours 200 miles / day. Pt did not have a stretch routine. Pt completed Pelvic PT via telehealth one year and tried stretching but it made it worse on R groin injury 2017R , anxiety, stress Fx in R shin, low back as kid. 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 Pt also  enjoyed cycling and stopped when the pain started. Pt used to cycle 2-3 hours 200 miles / day. Pt did not have a stretch routine. Pt completed Pelvic PT via telehealth one year and tried stretching but it made it worse   PRECAUTIONS:    SUBJECTIVE:    Subjective Assessment -       Subjective Pt reported he will be seeinga urologist who can provide botox injections for the pelvic muscles on 07/26/21 . Dr. Dr. Isac Caddy, MD at  [facility] went to the gym and went on elliptical machine stair machine ( cycling and not the stepping one). Pt did it once and felt good, broke a sweat and he still felt frequency and urgency and more pain/ tightness the next day.   Pt 's frequency has increased lately. Pt has an appt with his psychotherapist this coming Monday. Pt notices he is kinder to him self and calming himself down with it is ok, he notices he freaks out less, he feels less guilty and shame, he is catching himself more with changed thought patterns.      Pertinent History R groin  injury 2017 ,  anxiety, stress Fx in R shin, low back as kid. 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 Pt also enjoyed cycling and stopped when the pain started. Pt used to cycle 2-3 hours 200 miles / day. Pt did not have a stretch routine. Pt completed Pelvic PT via telehealth one year and tried stretching but it made it worse.                         TODAY'S TREATMENT:                 Pelvic Floor Special Questions - 06/15/22 1342     Pelvic Floor Internal Exam pt consented verbally and had no contraindications    Exam Type Rectal    Palpation posterior tilt of pelvis, tightness at R iliococcygeus , less pain with cue for anterior tilt, reflexive tightness of pelvic floor, immediate relaxation of pelvic floor with toning and guided visualization              [facility] Adult PT Treatment/Exercise - 06/15/22 1343       Therapeutic  Activites    Other Therapeutic Activities pain science appraoches , updates with change to PCP and upcoming botox Tx with Dr. Isac Caddy, MD at [facility] pt to practice relaxation of pelvic floor pre and post exercise at the gym      Neuro Re-ed    Neuro Re-ed Details  cued for relaxation of pelvic floor                 PATIENT EDUCATION: Education details: showed anatomy of mm attached by ischial tuberosity and explained correlation to his pain with functional tasks. Explained the role of decreased Wbing on RLE in simulated tennis movements and why hip abd/ ext were weaker on R with more overactivity on L side    Person educated: Patient Education method: Explanation, Demonstration, Tactile cues, Verbal cues, and Handouts   Education comprehension: verbalized understanding, returned demonstration, verbal cues required, tactile cues required, and needs further education     HOME EXERCISE PROGRAM: See pt instruction section            PT Long Term Goals -               PT LONG TERM GOAL #1    Title Pt will demo decreased abdominal separation from 4 fingers width  to < 1 fingers width to improve IAP system for pelvic floor function     Time 4     Period Weeks     Status Achieved          PT LONG TERM GOAL #2    Title Pt will demo improved posture with more anterior tilt of pelvis, increased lumbar lordosis, and no cues for proper sitting/ standing posture  in order to minimize CLBP and return to more functional activities     Time 6     Period Weeks     Status Achieved          PT LONG TERM GOAL #3    Title Pt will demo no spinal deviation and more levelled pelvic girdle across 2 visits in order to advance towards deep core/ thoracolumbar strengthening exercises     Time 2     Period Weeks     Status Achieved  PT LONG TERM GOAL #4    Title Pt will demo decreased R pelvic floor mm tightness across 2 visits in order to minimize pelvic pain and  straining with bowel movements and optimize IAP system properly     Time 8     Period Weeks     Status Achieved          PT LONG TERM GOAL #5    Title Pt's FOTO score for Pain to decrease from 29pts to < 24pts and Urinary from 38 pts to < 30 pts and Bowel from 17 pts to < 12 pts and  Bowel Leakage improve 59 pts to > 64 pts     Baseline 11/8:Urinary  38 pts to 33 pts, PFDI Bowel 17pts to 4pts, bowel Leakage 59pts to 72 pts   10/22/21 :  Pain 29pt --> 4pts,  Urinary 38 pts--> 4pts, Bowel 18 pts-->0 pts, Leakage  59 pts --> 83 pts     Time 10     Period Weeks     Status Achieved          PT LONG TERM GOAL #6    Title Pt will report no urge to urinate when sitting or bending forward to put on his back pack  in order to attend meetings or leave the house     Baseline 10/12/21: 50% improvement)  11/09/21: 60% improvement     Time 8     Period Weeks     Status Partially Met  03/29/2022             PT LONG TERM GOAL #7    Title Pt 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     Time 10     Period Weeks     Status Achieved          PT LONG TERM GOAL  #10    TITLE Pt will be able to hike for 77mles on low grade trail and/or 2 miles on a trail with inclines and report no pain .     Time 6     Period Months     Status Partially Met     Target Date 03/15/2022         PT LONG TERM GOAL  #11    TITLE Pt will maintain 2 cm of plantar  arches and and no tightness at L ITband/ around GT and lateral leg, hypomobility at lateral foot with compliance with modification with tossing tennis balls repeated at work     Baseline (10/12/21:  2cm B)     Time 10  Period Weeks     Status Achieved     Target Date 10/12/21          PT LONG TERM GOAL  #12    TITLE Pt will report decreased urgency with incline walking in order to hike with family     Time 10     Period Weeks     Status On-going     Target Date 08/16/21             PT LONG TERM GOAL  #13    TITLE Pt will demo no tensions nor tenderness at ischiocavernosus on L   ( 2 o'clock position_) and at L ischial tuberosity across 2 visits     Status On going     Target Date 08/16/2022                      Plan -       Clinical Impression Statement  Today applied pain science approaches.  Listened to pt's updates with change to PCP and upcoming botox Tx with Dr. Isac Caddy, MD at [facility]. Plan to provide pt's case to MD prior to his appt.   Educated pt to practice relaxation of pelvic floor pre and post exercise at the gym to minimize overactivity of pelvic floor as pt demo'd overactivity of pelvic floor through rectal assessment. Pt achieved immediate relaxation of pelvic floor with toning practice.    Pt continues to benefit from skilled PT       Examination-Activity Limitations Toileting;Sit     Stability/Clinical Decision Making Evolving/Moderate complexity     Rehab Potential Good     PT Frequency BiMonthy     PT Duration Other (comment)   5 months    PT Treatment/Interventions Neuromuscular re-education;Patient/family education;Therapeutic exercise;Scar mobilization;Taping;Manual techniques;Moist Heat;Therapeutic activities;Gait training;Balance training     Consulted and Agree with Plan of Care Patient                       Jerl Mina, PT

## 2022-06-14 NOTE — Addendum Note (Signed)
Addended by: Mariane Masters on: 06/14/2022 03:16 PM   Modules accepted: Orders

## 2022-06-21 ENCOUNTER — Ambulatory Visit: Payer: 59 | Admitting: Physical Therapy

## 2022-06-21 DIAGNOSIS — R2689 Other abnormalities of gait and mobility: Secondary | ICD-10-CM

## 2022-06-21 DIAGNOSIS — G8929 Other chronic pain: Secondary | ICD-10-CM | POA: Diagnosis not present

## 2022-06-21 DIAGNOSIS — M533 Sacrococcygeal disorders, not elsewhere classified: Secondary | ICD-10-CM

## 2022-06-21 DIAGNOSIS — R350 Frequency of micturition: Secondary | ICD-10-CM | POA: Diagnosis not present

## 2022-06-21 DIAGNOSIS — M4125 Other idiopathic scoliosis, thoracolumbar region: Secondary | ICD-10-CM

## 2022-06-21 DIAGNOSIS — R293 Abnormal posture: Secondary | ICD-10-CM | POA: Diagnosis not present

## 2022-06-21 DIAGNOSIS — M5442 Lumbago with sciatica, left side: Secondary | ICD-10-CM | POA: Diagnosis not present

## 2022-06-21 DIAGNOSIS — R102 Pelvic and perineal pain: Secondary | ICD-10-CM

## 2022-06-21 NOTE — Therapy (Signed)
OUTPATIENT PHYSICAL THERAPY TREATMENT NOTE    Patient Name: Alec Reynolds MRN: [id number] DOB:[birth year], 51 y.o., male      REFERRING PROVIDER: [employer]     PT End of Session - 06/21/22 1333     Visit Number 20    Date for PT Re-Evaluation 06/21/22   PN 05/09/20, 07/21/20, 12/29/20, 04/20/21, 11/09/21, 06/07/22   Authorization Type 18/ 35 visits of PT./OT/ chiro per year    PT Start Time 1332    PT Stop Time 1415    PT Time Calculation (min) 43 min    Activity Tolerance Patient tolerated treatment well;No increased pain    Behavior During Therapy [facility] for tasks assessed/performed                 No past medical history on file.      Past Surgical History:  Procedure Laterality Date   ELBOW SURGERY Left     WRIST SURGERY            Patient Active Problem List    Diagnosis Date Noted   Excessive sleepiness 01/30/2021   Urinary frequency 12/23/2019   Pelvic pain 12/23/2019   Low back pain 12/22/2019      REFERRING DIAG:      Diagnosis Description  Pelvic Floor dysfunction      THERAPY DIAG:  Sacrococcygeal disorders, not elsewhere classified   Pelvic pain   Urinary frequency   Other abnormalities of gait and mobility   Chronic bilateral low back pain with left-sided sciatica   Abnormal posture   Rationale for Evaluation and Treatment Rehabilitation   PERTINENT HISTORY:  R groin injury 2017R , anxiety, stress Fx in R shin, low back as kid. 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 Pt also enjoyed cycling and stopped when the pain started. Pt used to cycle 2-3 hours 200 miles / day. Pt did not have a stretch routine. Pt completed Pelvic PT via telehealth one year and tried stretching but it made it worse on R groin injury 2017R , anxiety, stress Fx in R shin, low back as kid. 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 Pt also  enjoyed cycling and stopped when the pain started. Pt used to cycle 2-3 hours 200 miles / day. Pt did not have a stretch routine. Pt completed Pelvic PT via telehealth one year and tried stretching but it made it worse   PRECAUTIONS:    SUBJECTIVE:    Subjective Assessment -       Subjective Pt had a better week. Pt noticed his Sx decreased the day after last session. 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   Pt had a appt with his psychotherapist . "It was good to reconnect" and it will start back up on regularly weekly basis.   Pt notices he is kinder to him self.      Pertinent History R groin injury 2017 ,  anxiety, stress Fx in R shin, low back as kid. 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 Pt also enjoyed cycling and stopped  when the pain started. Pt used to cycle 2-3 hours 200 miles / day. Pt did not have a stretch routine. Pt completed Pelvic PT via telehealth one year and tried stretching but it made it worse.                         TODAY'S TREATMENT:       [facility] PT Assessment - 06/21/22 1441       Palpation   SI assessment  L  iliac crest lower    Palpation comment tightness R peroneal longus/ tib ant/             [facility] Adult PT Treatment/Exercise - 06/21/22 1428       Therapeutic Activites    Other Therapeutic Activities educated shoe lift goes in L shoe, instructed how to order and place them in shoe toe box/ heel,  motionvational interviewing for his upcoming goals with fitness/ relaxation practices      Manual Therapy   Internal Pelvic Floor STM/MWM at R peroneal longus/ tib ant/ superior glide at tib/fib    Kinesiotex Facilitate Muscle      Kinesiotix   Facilitate Muscle  ER of tiba                 PATIENT EDUCATION: Education details: showed anatomy of mm attached by ischial tuberosity and explained correlation to his pain with functional tasks. Explained the role of decreased Wbing on RLE in simulated tennis movements and why hip abd/ ext were weaker on R with more overactivity on L side    Person educated: Patient Education method: Explanation, Demonstration, Tactile cues, Verbal cues, and Handouts   Education comprehension: verbalized understanding, returned demonstration, verbal cues required, tactile cues required, and needs further education     HOME EXERCISE PROGRAM: See pt instruction section            PT Long Term Goals -               PT LONG TERM GOAL #1    Title Pt will demo decreased abdominal separation from 4 fingers width  to < 1 fingers width to improve IAP system for pelvic floor function     Time 4     Period Weeks     Status Achieved          PT LONG TERM GOAL #2    Title Pt will demo improved posture with more anterior tilt of pelvis, increased lumbar lordosis, and no cues for proper sitting/ standing posture  in order to minimize CLBP and return to more functional activities     Time 6     Period Weeks     Status Achieved          PT LONG TERM GOAL #3    Title Pt will demo no spinal deviation and more levelled pelvic girdle across 2 visits in order to advance towards deep core/ thoracolumbar strengthening exercises     Time 2     Period Weeks     Status Achieved          PT LONG TERM GOAL #4    Title Pt will demo decreased R pelvic floor mm tightness across 2 visits in order to minimize pelvic pain and straining with bowel movements and optimize IAP system properly     Time 8     Period Weeks     Status Achieved  PT LONG TERM GOAL #5    Title Pt's FOTO score for Pain to decrease from 29pts to < 24pts and Urinary from 38 pts to < 30 pts and Bowel from 17 pts to < 12 pts and  Bowel Leakage improve 59 pts to > 64 pts      Baseline 11/8:Urinary  38 pts to 33 pts, PFDI Bowel 17pts to 4pts, bowel Leakage 59pts to 72 pts   10/22/21 :  Pain 29pt --> 4pts,  Urinary 38 pts--> 4pts, Bowel 18 pts-->0 pts, Leakage  59 pts --> 83 pts     Time 10     Period Weeks     Status Achieved          PT LONG TERM GOAL #6    Title Pt will report no urge to urinate when sitting or bending forward to put on his back pack  in order to attend meetings or leave the house     Baseline 10/12/21: 50% improvement)  11/09/21: 60% improvement     Time 8     Period Weeks     Status Partially Met  03/29/2022             PT LONG TERM GOAL #7    Title Pt 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     Time 10     Period Weeks     Status Achieved          PT LONG TERM GOAL  #10    TITLE Pt will be able to hike for 16mles on low grade trail and/or 2 miles on a trail with inclines and report no pain .     Time 6     Period Months     Status Partially Met     Target Date 03/15/2022         PT LONG TERM GOAL  #11    TITLE Pt will maintain 2 cm of plantar arches and and no tightness at L ITband/ around GT and lateral leg, hypomobility at lateral foot with compliance with modification with tossing tennis balls repeated at work     Baseline (10/12/21:  2cm B)     Time 10     Period Weeks     Status Achieved     Target Date 10/12/21          PT LONG TERM GOAL  #12    TITLE Pt will  report decreased urgency with incline walking in order to hike with family     Time 10     Period Weeks  Status On-going     Target Date 08/16/21             PT LONG TERM GOAL  #13    TITLE Pt will demo no tensions nor tenderness at ischiocavernosus on L   ( 2 o'clock position_) and at L ischial tuberosity across 2 visits     Status On going     Target Date 08/16/2022                      Plan -       Clinical Impression Statement Pt is noticing that vocalizing is a claming practice for him and that his pelvic pain and urinary frequency improved after last session. Pt plans to incorporate into this daily routine.    Shoe lift was provided to L shoe because pt had it in the R shoe which is likely related to pt's injury to R knee.   Manual Tx helped to decrease R knee pain . K tape was applied to promote ER of tibia.    Motivational interviewing was applied to discuss his upcoming goals with fitness/ relaxation practices in the new year.   Plan to practice calming practice before and after treadmill at next session. Pt continues to benefit from skilled PT       Examination-Activity Limitations Toileting;Sit     Stability/Clinical Decision Making Evolving/Moderate complexity     Rehab Potential Good     PT Frequency BiMonthy     PT Duration Other (comment)   5 months    PT Treatment/Interventions Neuromuscular re-education;Patient/family education;Therapeutic exercise;Scar mobilization;Taping;Manual techniques;Moist Heat;Therapeutic activities;Gait training;Balance training     Consulted and Agree with Plan of Care Patient                       Jerl Mina, PT

## 2022-06-28 ENCOUNTER — Ambulatory Visit: Payer: 59 | Admitting: Physical Therapy

## 2022-06-28 DIAGNOSIS — M533 Sacrococcygeal disorders, not elsewhere classified: Secondary | ICD-10-CM | POA: Diagnosis not present

## 2022-06-28 DIAGNOSIS — R102 Pelvic and perineal pain: Secondary | ICD-10-CM | POA: Diagnosis not present

## 2022-06-28 DIAGNOSIS — R350 Frequency of micturition: Secondary | ICD-10-CM | POA: Diagnosis not present

## 2022-06-28 DIAGNOSIS — R2689 Other abnormalities of gait and mobility: Secondary | ICD-10-CM | POA: Diagnosis not present

## 2022-06-28 DIAGNOSIS — R293 Abnormal posture: Secondary | ICD-10-CM | POA: Diagnosis not present

## 2022-06-28 DIAGNOSIS — M5442 Lumbago with sciatica, left side: Secondary | ICD-10-CM | POA: Diagnosis not present

## 2022-06-28 DIAGNOSIS — M4125 Other idiopathic scoliosis, thoracolumbar region: Secondary | ICD-10-CM | POA: Diagnosis not present

## 2022-06-28 DIAGNOSIS — G8929 Other chronic pain: Secondary | ICD-10-CM | POA: Diagnosis not present

## 2022-06-28 NOTE — Patient Instructions (Signed)
Strengthening feet arches:    Heel raises - heels together, minisquat  Minisquat motion, trunk bent , gaze onto floor like you are looking at your reflection over a lake/pond,  Knees bent pointed out like a "v" , navel ( center of mass) more forward  Heels together as you lift, pointed out like a "v"  KNEES ARE ALIGNED BEHIND THE TOES TO MINIMIZE STRAIN ON THE KNEES your  navel ( center of mass) more forward to a avoid dropping down fast and rocking more weight back onto heels , keep heels pressing against each other the whole time   10 reps  ________________  Single leg heel raise on R    _________  Step ups on squatty potty other way around or a low stool , away from wall,  hands on the wall   Step up L up, R up, L down, R down, exhale up and down, lower heel slow and controlled, shoulder slightly forward, center of mass moves after ballmounds contacts  10 x 3 reps each    _________  Feet slides :   Points of contact at sitting bones  Four points of contact of foot,  Side knee back while keeping knee out along 2-3rd toe line   Heel up, ankle not twist out Lower heel while keeping knee out along 2-3rd toe line Four points of contact of foot, Slide foot back while keeping knee out along 2-3rd toe line   Repeated with other foot     __ Stretches : Calf stretch - two types:   - toe extension against the wall while other knee is bent, pull buttocks back  5 breaths     - lunge position, back foot is down, heel and toes straight     Bent the back knee

## 2022-06-28 NOTE — Therapy (Signed)
OUTPATIENT PHYSICAL THERAPY TREATMENT NOTE   / [place]    Patient Name: Alec Reynolds MRN: [id number] DOB:1970-12-09, 51 y.o., male      REFERRING PROVIDER: [employer]night     PT End of Session - 06/28/22 1333     Visit Number 22    Date for PT Re-Evaluation 09/06/22   PN 05/09/20, 07/21/20, 12/29/20, 04/20/21, 11/09/21, 06/07/22   Authorization Type 19/ 35 visits of PT./OT/ chiro per year    PT Start Time 1330    PT Stop Time 1415    PT Time Calculation (min) 45 min    Activity Tolerance Patient tolerated treatment well;No increased pain    Behavior During Therapy [facility] for tasks assessed/performed                 No past medical history on file.      Past Surgical History:  Procedure Laterality Date   ELBOW SURGERY Left     WRIST SURGERY            Patient Active Problem List    Diagnosis Date Noted   Excessive sleepiness 01/30/2021   Urinary frequency 12/23/2019   Pelvic pain 12/23/2019   Low back pain 12/22/2019      REFERRING DIAG:      Diagnosis Description  Pelvic Floor dysfunction      THERAPY DIAG:  Sacrococcygeal disorders, not elsewhere classified   Pelvic pain   Urinary frequency   Other abnormalities of gait and mobility   Chronic bilateral low back pain with left-sided sciatica   Abnormal posture   Rationale for Evaluation and Treatment Rehabilitation   PERTINENT HISTORY:  R groin injury 2017R , anxiety, stress Fx in R shin, low back as kid. 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 Pt also enjoyed cycling and stopped when the pain started. Pt used to cycle 2-3 hours 200 miles / day. Pt did not have a stretch routine. Pt completed Pelvic PT via telehealth one year and tried stretching but it made it worse on R groin injury 2017R , anxiety, stress Fx in R shin, low back as kid. 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 Pt also enjoyed cycling and stopped when the pain started. Pt used to cycle 2-3 hours 200 miles / day. Pt did not have a stretch routine. Pt completed Pelvic PT via telehealth one year and tried stretching but it made it worse   PRECAUTIONS:    SUBJECTIVE:    Subjective Assessment -       Subjective Pt reported the R knee pain improved by 75% after last session but lasted til midday the day after.    Today pain is 4/10 and it hurts with landing on the foot and bending the knee with walking. There has not been any swelling. Pt had meniscal tear for years.      Pertinent History R groin injury 2017 ,  anxiety, stress Fx in R shin, low back as kid. 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 Pt also enjoyed cycling and stopped when the pain started. Pt used to cycle 2-3 hours 200 miles / day. Pt did not have a stretch routine. Pt completed Pelvic PT via telehealth one year and  tried stretching but it made it worse.                         TODAY'S TREATMENT:    [facility] PT Assessment - 06/28/22 1337       Observation/Other Assessments   Observations pt points ot pain at lateral leg and medial  aspect of patella pole      Other:   Other/ Comments step down :  no pain in R knee      Strength   Overall Strength Comments no pain with MMT, Knee flexion R 4/5, L Knee flexion 5/5      Palpation   Palpation comment hypomobile midfoot joints limited toe abduction/ DF/EV, tightness along posterior aspect of plantar arch/ retinaculum at lateral ankle, ant tib/ peronal longus/ brevis , hypomobile tib/fib R ,             [facility] Adult PT Treatment/Exercise - 06/28/22 1547       Manual Therapy   Manual therapy comments STM/MWM to address hypomobile midfoot joints limited toe abduction/ DF/EV, tightness along posterior aspect of plantar arch/ retinaculum at lateral ankle, ant tib/ peronal longus/ brevis , hypomobile  tib/fib R and promote more ER of femur and tibia      [employer]   Facilitate Muscle  ER of tiba, toe abduction, lift arch, ER of tibia                 PATIENT EDUCATION: Education details: showed anatomy of mm attached by ischial tuberosity and explained correlation to his pain with functional tasks. Explained the role of decreased Wbing on RLE in simulated tennis movements and why hip abd/ ext were weaker on R with more overactivity on L side    Person educated: Patient Education method: Explanation, Demonstration, Tactile cues, Verbal cues, and Handouts   Education comprehension: verbalized understanding, returned demonstration, verbal cues required, tactile cues required, and needs further education     HOME EXERCISE PROGRAM: See pt instruction section            PT Long Term Goals -               PT LONG TERM GOAL #1    Title Pt will demo decreased abdominal separation from 4 fingers width  to < 1 fingers width to improve IAP system for pelvic floor function     Time 4     Period Weeks     Status Achieved          PT LONG TERM GOAL #2    Title Pt will demo improved posture with more anterior tilt of pelvis, increased lumbar lordosis, and no cues for proper sitting/ standing posture  in order to minimize CLBP and return to more functional activities     Time 6     Period Weeks     Status Achieved          PT LONG TERM GOAL #3    Title Pt will demo no spinal deviation and more levelled pelvic girdle across 2 visits in order to advance towards deep core/ thoracolumbar strengthening exercises     Time 2     Period Weeks     Status Achieved          PT LONG TERM GOAL #4    Title Pt will demo decreased R pelvic floor mm tightness across 2 visits in order to minimize pelvic pain and straining with bowel movements  and optimize IAP system properly     Time 8     Period Weeks     Status Achieved          PT LONG TERM GOAL #5    Title Pt's FOTO score for Pain to  decrease from 29pts to < 24pts and Urinary from 38 pts to < 30 pts and Bowel from 17 pts to < 12 pts and  Bowel Leakage improve 59 pts to > 64 pts     Baseline 11/8:Urinary  38 pts to 33 pts, PFDI Bowel 17pts to 4pts, bowel Leakage 59pts to 72 pts   10/22/21 :  Pain 29pt --> 4pts,  Urinary 38 pts--> 4pts, Bowel 18 pts-->0 pts, Leakage  59 pts --> 83 pts     Time 10     Period Weeks     Status Achieved          PT LONG TERM GOAL #6    Title Pt will report no urge to urinate when sitting or bending forward to put on his back pack  in order to attend meetings or leave the house     Baseline 10/12/21: 50% improvement)  11/09/21: 60% improvement     Time 8     Period Weeks     Status Partially Met  03/29/2022             PT LONG TERM GOAL #7    Title Pt 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     Time 10     Period Weeks     Status Achieved          PT LONG TERM GOAL  #10    TITLE Pt will be able to hike for 48mles on low grade trail and/or 2 miles on a trail with inclines and report no pain .     Time 6     Period Months     Status Partially Met     Target Date 03/15/2022         PT LONG TERM GOAL  #11    TITLE Pt will maintain 2 cm of plantar arches and and no tightness at L ITband/ around GT and lateral leg, hypomobility at lateral foot with compliance with modification with tossing tennis balls repeated at work      Baseline (10/12/21:  2cm B)     Time 10     Period Weeks     Status Achieved     Target Date 10/12/21          PT LONG TERM GOAL  #12  TITLE Pt will report decreased urgency with incline walking in order to hike with family     Time 10     Period Weeks     Status On-going     Target Date 08/16/21             PT LONG TERM GOAL  #13    TITLE Pt will demo no tensions nor tenderness at ischiocavernosus on L   ( 2 o'clock position_) and at L ischial tuberosity across 2 visits     Status On going     Target Date 08/16/2022                      Plan -       Clinical Impression Statement Pt achieved 9/13 goals and progressing well towards remaining goals.  Pelvic pain and frequency have improved but with increased stress, Sx worsen. Pt continues to work with psychotherapist for stress management.  Pt was progressing towards fitness phase of POC with integration to gym routine to build up cardio which was pt's form of stress relief. Pt required more calming and pelvic floor relaxation practices prior to use of cardio machine to minimize overactivity of pelvic floor and tight B hamstrings. Was focused on progressing pt to treadmill assessment but pt incurred a R knee injury while playing tennis. After today's manual Tx, pt reported no R knee pain. Plan to continue to build R lower kinetic chain and promote lifted arch similarly to L foot arch. Resume progression to treadmill jogging after lower  kinetic chain deficits have improved in order to minimize injuries.   Pt continues to benefit from skilled PT to achieve to remaining goals.       Examination-Activity Limitations Toileting;Sit     Stability/Clinical Decision Making Evolving/Moderate complexity     Rehab Potential Good     PT Frequency BiMonthy     PT Duration Other (comment)   5 months    PT Treatment/Interventions Neuromuscular re-education;Patient/family education;Therapeutic exercise;Scar mobilization;Taping;Manual  techniques;Moist Heat;Therapeutic activities;Gait training;Balance training     Consulted and Agree with Plan of Care Patient                       Jerl Mina, PT

## 2022-07-05 ENCOUNTER — Ambulatory Visit: Payer: 59 | Attending: Physician Assistant | Admitting: Physical Therapy

## 2022-07-05 DIAGNOSIS — R293 Abnormal posture: Secondary | ICD-10-CM | POA: Insufficient documentation

## 2022-07-05 DIAGNOSIS — M533 Sacrococcygeal disorders, not elsewhere classified: Secondary | ICD-10-CM | POA: Insufficient documentation

## 2022-07-05 DIAGNOSIS — M5442 Lumbago with sciatica, left side: Secondary | ICD-10-CM | POA: Diagnosis not present

## 2022-07-05 DIAGNOSIS — R102 Pelvic and perineal pain: Secondary | ICD-10-CM | POA: Diagnosis not present

## 2022-07-05 DIAGNOSIS — M4125 Other idiopathic scoliosis, thoracolumbar region: Secondary | ICD-10-CM | POA: Diagnosis not present

## 2022-07-05 DIAGNOSIS — R350 Frequency of micturition: Secondary | ICD-10-CM | POA: Insufficient documentation

## 2022-07-05 DIAGNOSIS — G8929 Other chronic pain: Secondary | ICD-10-CM | POA: Diagnosis not present

## 2022-07-05 DIAGNOSIS — R2689 Other abnormalities of gait and mobility: Secondary | ICD-10-CM | POA: Diagnosis not present

## 2022-07-05 NOTE — Therapy (Signed)
OUTPATIENT PHYSICAL THERAPY TREATMENT NOTE   Patient Name: Alec Reynolds MRN: [id number] DOB:1971/01/11, 52 y.o., male      REFERRING PROVIDER: [employer]night     PT End of Session - 07/05/22 1339     Visit Number 56    Date for PT Re-Evaluation 09/06/22   PN 05/09/20, 07/21/20, 12/29/20, 04/20/21, 11/09/21, 06/07/22   Authorization Type 1/35  visits of PT./OT/ chiro per year    PT Start Time 2229    PT Stop Time 1415    PT Time Calculation (min) 41 min    Activity Tolerance Patient tolerated treatment well;No increased pain    Behavior During Therapy [facility] for tasks assessed/performed                 No past medical history on file.      Past Surgical History:  Procedure Laterality Date   ELBOW SURGERY Left     WRIST SURGERY            Patient Active Problem List    Diagnosis Date Noted   Excessive sleepiness 01/30/2021   Urinary frequency 12/23/2019   Pelvic pain 12/23/2019   Low back pain 12/22/2019      REFERRING DIAG:      Diagnosis Description  Pelvic Floor dysfunction      THERAPY DIAG:  Sacrococcygeal disorders, not elsewhere classified   Pelvic pain   Urinary frequency   Other abnormalities of gait and mobility   Chronic bilateral low back pain with left-sided sciatica   Abnormal posture   Rationale for Evaluation and Treatment Rehabilitation   PERTINENT HISTORY:  R groin injury 2017R , anxiety, stress Fx in R shin, low back as kid. 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 Pt also enjoyed cycling and stopped when the pain started. Pt used to cycle 2-3 hours 200 miles / day. Pt did not have a stretch routine. Pt completed Pelvic PT via telehealth one year and tried stretching but it made it worse on R groin injury 2017R , anxiety, stress Fx in R shin, low back as kid. 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 Pt also  enjoyed cycling and stopped when the pain started. Pt used to cycle 2-3 hours 200 miles / day. Pt did not have a stretch routine. Pt completed Pelvic PT via telehealth one year and tried stretching but it made it worse   PRECAUTIONS:    SUBJECTIVE:    Subjective Assessment -       Subjective Pt reported his urgency and pelvic pain improved over the past 7 days when he was not working and having to play tennis at the job. Pt did do admin work. Pt plans to get acupuncture in Feb and also will get a consult appt about botox Tx at appt end of January.    Pt continues to use vocalization to relax pelvic floor.   R knee is much better and he wanted to wait for it to heal before getting back to cardio routine .        Pertinent History R groin injury 2017 ,  anxiety, stress Fx in R shin, low back as kid. 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 Pt also enjoyed  cycling and stopped when the pain started. Pt used to cycle 2-3 hours 200 miles / day. Pt did not have a stretch routine. Pt completed Pelvic PT via telehealth one year and tried stretching but it made it worse.                         TODAY'S TREATMENT:     [facility] PT Assessment - 07/05/22 1623       Palpation   Palpation comment hypomobile midfoot joints limited toe abduction/ DF/EV, tightness along posterior aspect of plantar arch/ retinaculum at lateral ankle, limited tibia ER            [facility] Adult PT Treatment/Exercise - 07/05/22 1429       Therapeutic Activites    Other Therapeutic Activities active listening, motivational interviewing, discussed plans fo 2024, upcoming stretgveies for stress and Sx management      Manual Therapy   Manual therapy comments STM/MWM to address hypomobile midfoot joints limited toe abduction/ DF/EV / tibia ER                  PATIENT EDUCATION: Education details: showed anatomy of mm attached by ischial  tuberosity and explained correlation to his pain with functional tasks. Explained the role of decreased Wbing on RLE in simulated tennis movements and why hip abd/ ext were weaker on R with more overactivity on L side    Person educated: Patient Education method: Explanation, Demonstration, Tactile cues, Verbal cues, and Handouts   Education comprehension: verbalized understanding, returned demonstration, verbal cues required, tactile cues required, and needs further education     HOME EXERCISE PROGRAM: See pt instruction section            PT Long Term Goals -               PT LONG TERM GOAL #1    Title Pt will demo decreased abdominal separation from 4 fingers width  to < 1 fingers width to improve IAP system for pelvic floor function     Time 4     Period Weeks     Status Achieved          PT LONG TERM GOAL #2    Title Pt will demo improved posture with more anterior tilt of pelvis, increased lumbar lordosis, and no cues for proper sitting/ standing posture  in order to minimize CLBP and return to more functional activities     Time 6     Period Weeks     Status Achieved          PT LONG TERM GOAL #3    Title Pt will demo no spinal deviation and more levelled pelvic girdle across 2 visits in order to advance towards deep core/ thoracolumbar strengthening exercises     Time 2     Period Weeks     Status Achieved          PT LONG TERM GOAL #4    Title Pt will demo decreased R pelvic floor mm tightness across 2 visits in order to minimize pelvic pain and straining with bowel movements and optimize IAP system properly     Time 8     Period Weeks     Status Achieved          PT LONG TERM GOAL #5    Title Pt's FOTO score for Pain to decrease from 29pts to < 24pts and Urinary from 38  pts to < 30 pts and Bowel from 17 pts to < 12 pts and  Bowel Leakage improve 59 pts to > 64 pts     Baseline 11/8:Urinary  38 pts to 33 pts, PFDI Bowel 17pts to 4pts, bowel Leakage 59pts to 72 pts    10/22/21 :  Pain 29pt --> 4pts,  Urinary 38 pts--> 4pts, Bowel 18 pts-->0 pts, Leakage  59 pts --> 83 pts     Time 10     Period Weeks     Status Achieved          PT LONG TERM GOAL #6    Title Pt will report no urge to urinate when sitting or bending forward to put on his back pack  in order to attend meetings or leave the house     Baseline 10/12/21: 50% improvement)  11/09/21: 60% improvement     Time 8     Period Weeks     Status Partially Met  03/29/2022             PT LONG TERM GOAL #7    Title Pt 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     Time 10     Period Weeks     Status Achieved          PT LONG TERM GOAL  #10    TITLE Pt will be able to hike for 73mles on low grade trail and/or 2 miles on a trail with inclines and report no pain .     Time 6     Period Months     Status Partially Met     Target Date 03/15/2022         PT LONG TERM GOAL  #11    TITLE Pt will maintain 2 cm of plantar arches and and no tightness at L ITband/ around GT and lateral leg, hypomobility at lateral foot with compliance with modification with tossing tennis balls repeated at work     Baseline (10/12/21:  2cm B)     Time 10     Period Weeks     Status Achieved     Target Date 10/12/21          PT LONG TERM GOAL  #12    TITLE Pt will report decreased urgency with incline walking in order to hike with family     Time 10     Period  Weeks     Status On-going     Target Date 08/16/21             PT LONG  TERM GOAL  #13    TITLE Pt will demo no tensions nor tenderness at ischiocavernosus on L   ( 2 o'clock position_) and at L ischial tuberosity across 2 visits     Status On going     Target Date 08/16/2022                      Plan -       Clinical Impression Statement Pt reported his urgency and pelvic pain improved over the past 7 days when he was not working and having to play tennis at the job. Pt did do admin work. Pt plans to get acupuncture in Feb and also will get a consult appt about botox Tx at appt end of January.  Discussed wholistic strategies to minimize stress relief with continued appts with his psychotherapy. Pt is starting to make plans with wife for a 3 day weekend get-away to relax which is the first time he has done this.   Pt continues to use vocalization to relax pelvic floor.   R knee is much better and he wanted to wait for it to heal before getting back to cardio routine .    Today's manual Tx which addressed remaining hypomobility of R midfoot and ER of tibia. Pt demo'd improved midfoot mobility in R foot.  Plan to continue to build R lower kinetic chain and promote lifted arch similarly to L foot arch. Resume progression to treadmill jogging after lower kinetic chain deficits have improved in order to minimize injuries.   Pt continues to benefit from skilled PT to achieve to remaining goals.       Examination-Activity Limitations Toileting;Sit     Stability/Clinical Decision Making Evolving/Moderate complexity     Rehab Potential Good     PT Frequency BiMonthy     PT Duration Other (comment)   5 months    PT Treatment/Interventions Neuromuscular re-education;Patient/family education;Therapeutic exercise;Scar mobilization;Taping;Manual techniques;Moist Heat;Therapeutic activities;Gait training;Balance training     Consulted and Agree with Plan of Care Patient                        Jerl Mina, PT

## 2022-07-19 ENCOUNTER — Ambulatory Visit: Payer: 59 | Admitting: Physical Therapy

## 2022-07-19 DIAGNOSIS — R293 Abnormal posture: Secondary | ICD-10-CM

## 2022-07-19 DIAGNOSIS — R2689 Other abnormalities of gait and mobility: Secondary | ICD-10-CM

## 2022-07-19 DIAGNOSIS — M5442 Lumbago with sciatica, left side: Secondary | ICD-10-CM | POA: Diagnosis not present

## 2022-07-19 DIAGNOSIS — M4125 Other idiopathic scoliosis, thoracolumbar region: Secondary | ICD-10-CM | POA: Diagnosis not present

## 2022-07-19 DIAGNOSIS — R350 Frequency of micturition: Secondary | ICD-10-CM | POA: Diagnosis not present

## 2022-07-19 DIAGNOSIS — R102 Pelvic and perineal pain: Secondary | ICD-10-CM

## 2022-07-19 DIAGNOSIS — G8929 Other chronic pain: Secondary | ICD-10-CM

## 2022-07-19 DIAGNOSIS — M533 Sacrococcygeal disorders, not elsewhere classified: Secondary | ICD-10-CM

## 2022-07-19 NOTE — Therapy (Signed)
OUTPATIENT PHYSICAL THERAPY TREATMENT NOTE   Patient Name: Alec Reynolds MRN: [id number] DOB:[birth year], 52 y.o., male      REFERRING PROVIDER: [employer]     PT End of Session - 07/19/22 1340     Visit Number 38    Date for PT Re-Evaluation 09/06/22   PN 05/09/20, 07/21/20, 12/29/20, 04/20/21, 11/09/21, 06/07/22   Authorization Type 2/35  visits of PT./OT/ chiro per year    PT Start Time 1336    PT Stop Time 1415    PT Time Calculation (min) 39 min    Activity Tolerance Patient tolerated treatment well;No increased pain    Behavior During Therapy [facility] for tasks assessed/performed                 No past medical history on file.      Past Surgical History:  Procedure Laterality Date   ELBOW SURGERY Left     WRIST SURGERY            Patient Active Problem List    Diagnosis Date Noted   Excessive sleepiness 01/30/2021   Urinary frequency 12/23/2019   Pelvic pain 12/23/2019   Low back pain 12/22/2019      REFERRING DIAG:      Diagnosis Description  Pelvic Floor dysfunction      THERAPY DIAG:  Sacrococcygeal disorders, not elsewhere classified   Pelvic pain   Urinary frequency   Other abnormalities of gait and mobility   Chronic bilateral low back pain with left-sided sciatica   Abnormal posture   Rationale for Evaluation and Treatment Rehabilitation   PERTINENT HISTORY:  R groin injury 2017R , anxiety, stress Fx in R shin, low back as kid. 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 Pt also enjoyed cycling and stopped when the pain started. Pt used to cycle 2-3 hours 200 miles / day. Pt did not have a stretch routine. Pt completed Pelvic PT via telehealth one year and tried stretching but it made it worse on R groin injury 2017R , anxiety, stress Fx in R shin, low back as kid. 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 Pt also  enjoyed cycling and stopped when the pain started. Pt used to cycle 2-3 hours 200 miles / day. Pt did not have a stretch routine. Pt completed Pelvic PT via telehealth one year and tried stretching but it made it worse   PRECAUTIONS:    SUBJECTIVE:    Subjective Assessment -       Subjective Pt reported he went to see a chiropractor and his hip and knee got much better but the Tx flared up his pelvic pain and urgency. Pt received the scraping technique     Pertinent History R groin injury 2017 ,  anxiety, stress Fx in R shin, low back as kid. 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 Pt also enjoyed cycling and stopped when the pain started. Pt used to cycle 2-3 hours 200 miles / day. Pt did not have a stretch routine. Pt completed Pelvic PT via telehealth one year and tried stretching but it made it worse.  TODAY'S TREATMENT:    [facility] PT Assessment - 07/19/22 1343       Observation/Other Assessments   Observations bruising along R medial thigh, L posterior thigh      Coordination   Coordination and Movement Description proper lenghtening of pelvic floor with diaphramgatic, when cued for toe abduction, pelvic floor lengthened more, pt demo'd less reflex tightness             [facility] Adult PT Treatment/Exercise - 07/19/22 1547       Neuro Re-ed    Neuro Re-ed Details  cued for anterior tilt of pelvis, toe abduction      Modalities   Modalities Moist Heat      Moist Heat Therapy   Number Minutes Moist Heat 5 Minutes    Moist Heat Location --   sacrum/ perineum in prone to promote anterior tilt of pelvis, cued for lengthening of pelvic floor     Manual Therapy   Internal Pelvic Floor STM/MWM at anterior pelvic floor , R 5 o'clock ( pt in R sidelying)               PATIENT EDUCATION: Education details: showed anatomy of mm attached by ischial tuberosity and explained  correlation to his pain with functional tasks. Explained the role of decreased Wbing on RLE in simulated tennis movements and why hip abd/ ext were weaker on R with more overactivity on L side    Person educated: Patient Education method: Explanation, Demonstration, Tactile cues, Verbal cues, and Handouts   Education comprehension: verbalized understanding, returned demonstration, verbal cues required, tactile cues required, and needs further education     HOME EXERCISE PROGRAM: See pt instruction section            PT Long Term Goals -               PT LONG TERM GOAL #1    Title Pt will demo decreased abdominal separation from 4 fingers width  to < 1 fingers width to improve IAP system for pelvic floor function     Time 4     Period Weeks     Status Achieved          PT LONG TERM GOAL #2    Title Pt will demo improved posture with more anterior tilt of pelvis, increased lumbar lordosis, and no cues for proper sitting/ standing posture  in order to minimize CLBP and return to more functional activities     Time 6     Period Weeks     Status Achieved          PT LONG TERM GOAL #3    Title Pt will demo no spinal deviation and more levelled pelvic girdle across 2 visits in order to advance towards deep core/ thoracolumbar strengthening exercises     Time 2     Period Weeks     Status Achieved          PT LONG TERM GOAL #4    Title Pt will demo decreased R pelvic floor mm tightness across 2 visits in order to minimize pelvic pain and straining with bowel movements and optimize IAP system properly     Time 8     Period Weeks     Status Achieved          PT LONG TERM GOAL #5    Title Pt's FOTO score for Pain to decrease from 29pts to < 24pts and Urinary from 38 pts  to < 30 pts and Bowel from 17 pts to < 12 pts and  Bowel Leakage improve 59 pts to > 64 pts     Baseline 11/8:Urinary  38 pts to 33 pts, PFDI Bowel 17pts to 4pts, bowel Leakage 59pts to 72 pts   10/22/21 :  Pain 29pt  --> 4pts,  Urinary 38 pts--> 4pts, Bowel 18 pts-->0 pts, Leakage  59 pts --> 83 pts     Time 10     Period Weeks     Status Achieved          PT LONG TERM GOAL #6    Title Pt will report no urge to urinate when sitting or bending forward to put on his back pack  in order to attend meetings or leave the house     Baseline 10/12/21: 50% improvement)  11/09/21: 60% improvement     Time 8     Period Weeks     Status Partially Met  03/29/2022             PT LONG TERM GOAL #7    Title Pt 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     Time 10     Period Weeks     Status Achieved          PT LONG TERM GOAL  #10    TITLE Pt will be able to hike for on low grade trail and/or 2 miles on a trail with inclines and report no pain .     Time 6     Period Months     Status Partially Met     Target Date 03/15/2022         PT LONG TERM GOAL  #11    TITLE Pt will maintain 2 cm of plantar arches and and no tightness at L ITband/ around GT and lateral leg, hypomobility at lateral foot with compliance with modification with tossing tennis balls repeated at work     Baseline (10/12/21:  2cm B)     Time 10     Period Weeks     Status Achieved     Target Date 10/12/21          PT LONG TERM GOAL  #12    TITLE Pt will report decreased urgency with incline walking in order to hike with family     Time 10     Period Weeks     Status  On-going     Target Date 08/16/21             PT LONG TERM  GOAL  #13    TITLE Pt will demo no tensions nor tenderness at ischiocavernosus on L   ( 2 o'clock position_) and at L ischial tuberosity across 2 visits     Status On going     Target Date 08/16/2022                      Plan -       Clinical Impression Statement Pt had a relapse of pelvic pain and urgency after chiropractic Tx which helped with his hip and knee.   Internal pelvic floor assessment today showed less reflex tightness and increased ability to lengthen pelvic during manual Tx. Cues for toe abduction and anterior tilts facilitated more lengthening.   Plan to write up a summary for pt 's new MD whom he is consulting next week for botox Tx. Pt completed medical release form.  Pt continues to benefit from skilled PT to achieve to remaining goals.       Examination-Activity Limitations Toileting;Sit     Stability/Clinical Decision Making Evolving/Moderate complexity     Rehab Potential Good     PT Frequency BiMonthy     PT Duration Other (comment)   5 months    PT Treatment/Interventions Neuromuscular re-education;Patient/family education;Therapeutic exercise;Scar mobilization;Taping;Manual techniques;Moist Heat;Therapeutic activities;Gait training;Balance training     Consulted and Agree with Plan of Care Patient                       Jerl Mina, PT

## 2022-07-24 DIAGNOSIS — Z13220 Encounter for screening for lipoid disorders: Secondary | ICD-10-CM | POA: Diagnosis not present

## 2022-07-24 DIAGNOSIS — R69 Illness, unspecified: Secondary | ICD-10-CM | POA: Diagnosis not present

## 2022-07-24 DIAGNOSIS — Z125 Encounter for screening for malignant neoplasm of prostate: Secondary | ICD-10-CM | POA: Diagnosis not present

## 2022-07-24 DIAGNOSIS — F4323 Adjustment disorder with mixed anxiety and depressed mood: Secondary | ICD-10-CM | POA: Diagnosis not present

## 2022-08-02 ENCOUNTER — Ambulatory Visit: Payer: 59 | Attending: Physician Assistant | Admitting: Physical Therapy

## 2022-08-02 DIAGNOSIS — G8929 Other chronic pain: Secondary | ICD-10-CM | POA: Diagnosis present

## 2022-08-02 DIAGNOSIS — M4125 Other idiopathic scoliosis, thoracolumbar region: Secondary | ICD-10-CM

## 2022-08-02 DIAGNOSIS — M5442 Lumbago with sciatica, left side: Secondary | ICD-10-CM | POA: Diagnosis present

## 2022-08-02 DIAGNOSIS — R293 Abnormal posture: Secondary | ICD-10-CM | POA: Diagnosis present

## 2022-08-02 DIAGNOSIS — R102 Pelvic and perineal pain unspecified side: Secondary | ICD-10-CM

## 2022-08-02 DIAGNOSIS — R350 Frequency of micturition: Secondary | ICD-10-CM | POA: Diagnosis present

## 2022-08-02 DIAGNOSIS — M533 Sacrococcygeal disorders, not elsewhere classified: Secondary | ICD-10-CM | POA: Diagnosis present

## 2022-08-02 DIAGNOSIS — R2689 Other abnormalities of gait and mobility: Secondary | ICD-10-CM | POA: Diagnosis present

## 2022-08-02 NOTE — Therapy (Signed)
OUTPATIENT PHYSICAL THERAPY TREATMENT NOTE   Patient Name: Alec Reynolds MRN: [id number] DOB:1971-04-14, 52 y.o., male      REFERRING PROVIDER: [employer]night     PT End of Session - 08/02/22 1342     Visit Number 30    Date for PT Re-Evaluation 09/06/22   PN 05/09/20, 07/21/20, 12/29/20, 04/20/21, 11/09/21, 06/07/22   Authorization Type 3/35  visits of PT./OT/ chiro per year    PT Start Time 1332    PT Stop Time 1440    PT Time Calculation (min) 68 min    Activity Tolerance Patient tolerated treatment well;No increased pain    Behavior During Therapy [facility] for tasks assessed/performed                 No past medical history on file.      Past Surgical History:  Procedure Laterality Date   ELBOW SURGERY Left     WRIST SURGERY            Patient Active Problem List    Diagnosis Date Noted   Excessive sleepiness 01/30/2021   Urinary frequency 12/23/2019   Pelvic pain 12/23/2019   Low back pain 12/22/2019      REFERRING DIAG:      Diagnosis Description  Pelvic Floor dysfunction      THERAPY DIAG:  Sacrococcygeal disorders, not elsewhere classified   Pelvic pain   Urinary frequency   Other abnormalities of gait and mobility   Chronic bilateral low back pain with left-sided sciatica   Abnormal posture   Rationale for Evaluation and Treatment Rehabilitation   PERTINENT HISTORY:  R groin injury 2017R , anxiety, stress Fx in R shin, low back as kid. 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 Pt also enjoyed cycling and stopped when the pain started. Pt used to cycle 2-3 hours 200 miles / day. Pt did not have a stretch routine. Pt completed Pelvic PT via telehealth one year and tried stretching but it made it worse on R groin injury 2017R , anxiety, stress Fx in R shin, low back as kid. 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 Pt also  enjoyed cycling and stopped when the pain started. Pt used to cycle 2-3 hours 200 miles / day. Pt did not have a stretch routine. Pt completed Pelvic PT via telehealth one year and tried stretching but it made it worse   PRECAUTIONS:    SUBJECTIVE:    Subjective Assessment -       Subjective Pt saw a urologist at [facility]. He has been referred to Pelvic Clinic there and was referred for dry needling.  Pt will start acupuncture next week.    Last week he tried the elliptical and notice the L pelvic floor getting tight afterwards and got a good sweat for mental health. 25 min on the elliptical that moves circularly was better than stair climber. It caused  and urinary frequency  was manageable.   Pt noticed that when he played more vigorously with other tennis players with higher skill levels, there is less pelvic pain and urgency during the playing but it returns when the game is done and he sits down. The more he sits down, the more the  Sx ramps up.  Pt has been overusing the heat pad to sit on. Pt  also uses hot epsom bath but he is still sitting but it becomes uncomfortable. It does help.      Pertinent History R groin injury 2017 ,  anxiety, stress Fx in R shin, low back as kid. 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 Pt also enjoyed cycling and stopped when the pain started. Pt used to cycle 2-3 hours 200 miles / day. Pt did not have a stretch routine. Pt completed Pelvic PT via telehealth one year and tried stretching but it made it worse.                         TODAY'S TREATMENT:   [facility] PT Assessment - 08/02/22 1521       Sensation   Additional Comments decreased sensation on L sacral area up to mid posterior thigh, able to distinguish between sharp/ dull bilaterally   noted skin blotches from overuse of heating pad            Pelvic Floor Special Questions - 08/02/22 1512     Pelvic Floor Internal Exam  pt consented verbally and had no contraindications    Exam Type Rectal    Palpation tightness/ tenderness at anterior puborectalis B, , pain at 10 o'clock,   no cue for anterior tilt            [facility] Adult PT Treatment/Exercise - 08/02/22 1510       Therapeutic Activites    Other Therapeutic Activities active listening to pt's consult at [facility], and upcoming acupuncture next week. Advised pt to minimize sitting on heating pad due to skin changes noted. Pt voiced agreement to limit time to 25 min.  Provided towel modification to tolerate seating.      Neuro Re-ed    Neuro Re-ed Details  cued for diaphragmatic breathing  in seated and sideyling position , explaiend pelvic floor relaxation with diaphragmatic excursion      Manual Therapy   Internal Pelvic Floor STM/MWM at anterior pelvic floor , R 5 o'clock ( pt in R sidelying)                PATIENT EDUCATION: Education details: showed anatomy of mm attached by ischial tuberosity and explained correlation to his pain with functional tasks. Explained the role of decreased Wbing on RLE in simulated tennis movements and why hip abd/ ext were weaker on R with more overactivity on L side    Person educated: Patient Education method: Explanation, Demonstration, Tactile cues, Verbal cues, and Handouts   Education comprehension: verbalized understanding, returned demonstration, verbal cues required, tactile cues required, and needs further education     HOME EXERCISE PROGRAM: See pt instruction section            PT Long Term Goals -               PT LONG TERM GOAL #1    Title Pt will demo decreased abdominal separation from 4 fingers width  to < 1 fingers width to improve IAP system for pelvic floor function     Time 4     Period Weeks     Status Achieved          PT LONG TERM GOAL #2    Title Pt will demo improved posture with more anterior tilt of pelvis, increased lumbar lordosis, and no cues for proper sitting/  standing posture  in order to minimize CLBP and return to more functional activities     Time 6     Period Weeks     Status Achieved          PT LONG TERM GOAL #3    Title Pt will demo no spinal deviation and more levelled pelvic girdle across 2 visits in order to advance towards deep core/ thoracolumbar strengthening exercises     Time 2     Period Weeks     Status Achieved          PT LONG TERM GOAL #4    Title Pt will demo decreased R pelvic floor mm tightness across 2 visits in order to minimize pelvic pain and straining with bowel movements and optimize IAP system properly     Time 8     Period Weeks     Status Achieved          PT LONG TERM GOAL #5    Title Pt's FOTO score for Pain to decrease from 29pts to < 24pts and Urinary from 38 pts to < 30 pts and Bowel from 17 pts to < 12 pts and  Bowel Leakage improve 59 pts to > 64 pts     Baseline 11/8:Urinary  38 pts to 33 pts, PFDI Bowel 17pts to 4pts, bowel Leakage 59pts to 72 pts   10/22/21 :  Pain 29pt --> 4pts,  Urinary 38 pts--> 4pts, Bowel 18 pts-->0 pts, Leakage  59 pts --> 83 pts     Time 10     Period Weeks     Status Achieved          PT LONG TERM GOAL #6    Title Pt will report no urge to urinate when sitting or bending forward to put on his back pack  in order to attend meetings or leave the house     Baseline 10/12/21: 50% improvement)  11/09/21: 60% improvement     Time 8     Period Weeks     Status Partially Met  03/29/2022             PT LONG TERM GOAL #7    Title Pt 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     Time 10     Period Weeks     Status Achieved          PT LONG TERM GOAL  #10    TITLE Pt will be able to hike for 45miles on low grade trail and/or 2 miles on a trail with inclines and  report no pain .     Time 6     Period Months     Status Partially Met     Target Date 03/15/2022         PT LONG TERM GOAL  #11    TITLE Pt will maintain 2 cm of plantar arches and and no tightness at L ITband/ around GT and lateral leg, hypomobility at lateral foot with compliance with modification with tossing tennis balls repeated at work     Baseline (10/12/21:  2cm B)     Time 10     Period Weeks     Status Achieved     Target Date 10/12/21          PT LONG TERM GOAL  #12    TITLE Pt will report decreased urgency with incline walking in order to hike with family     Time 10     Period Weeks     Status On-going     Target Date 08/16/21             PT LONG TERM GOAL  #13    TITLE Pt will demo no tensions nor tenderness at ischiocavernosus on L   ( 2 o'clock position_) and at L ischial tuberosity across 2 visits     Status On going     Target Date 08/16/2022                      Plan -       Clinical Impression Statement  Provided active listening to pt's consult at [facility] last week and upcoming acupuncture next week. Provided encouragement to utilize acupuncture in upcoming weeks, provided pain science education.    Advised pt to minimize sitting on heating pad due to skin changes noted. Pt voiced agreement to limit time to 25 min.  Provided towel modification to tolerate seating.   Discussed next sessions will be focused on gym activities  Internal rectal assessment today showed dyscoordination of pelvic floor . Pt required cues for diaphragmatic excursion and avoid chest breathing. Pt demo'd correct technique after  training.  Noted skin changes on posterior thigh as pt reports he sits on heating pad for extensive time. Provided towel modifications to sit on for pain relief as an alternative to heat pad. Pt demo'd more anterior tilt and proper diaphragmatic excursion with cues. Pt voiced agreement to minimize time on heating pad to 25 min.   Pt continues to benefit from skilled PT to achieve to remaining goals.       Examination-Activity Limitations Toileting;Sit     Stability/Clinical Decision Making Evolving/Moderate complexity     Rehab Potential Good     PT Frequency BiMonthy     PT Duration Other (comment)   5 months    PT Treatment/Interventions Neuromuscular re-education;Patient/family education;Therapeutic exercise;Scar mobilization;Taping;Manual techniques;Moist Heat;Therapeutic activities;Gait training;Balance training     Consulted and Agree with Plan of Care Patient                       Jerl Mina, PT

## 2022-08-10 DIAGNOSIS — R102 Pelvic and perineal pain: Secondary | ICD-10-CM | POA: Diagnosis not present

## 2022-08-16 ENCOUNTER — Ambulatory Visit: Payer: 59 | Admitting: Physical Therapy

## 2022-08-30 ENCOUNTER — Ambulatory Visit: Payer: 59 | Admitting: Physical Therapy

## 2022-08-30 DIAGNOSIS — M4125 Other idiopathic scoliosis, thoracolumbar region: Secondary | ICD-10-CM

## 2022-08-30 DIAGNOSIS — R102 Pelvic and perineal pain: Secondary | ICD-10-CM | POA: Diagnosis not present

## 2022-08-30 DIAGNOSIS — R293 Abnormal posture: Secondary | ICD-10-CM

## 2022-08-30 DIAGNOSIS — M533 Sacrococcygeal disorders, not elsewhere classified: Secondary | ICD-10-CM

## 2022-08-30 DIAGNOSIS — R2689 Other abnormalities of gait and mobility: Secondary | ICD-10-CM

## 2022-08-30 DIAGNOSIS — R350 Frequency of micturition: Secondary | ICD-10-CM

## 2022-08-30 DIAGNOSIS — G8929 Other chronic pain: Secondary | ICD-10-CM

## 2022-08-30 NOTE — Therapy (Signed)
OUTPATIENT PHYSICAL THERAPY TREATMENT NOTE   Patient Name: Alec Reynolds MRN: [id number] DOB:1970/11/16, 52 y.o., male      REFERRING PROVIDER: Fosnight     PT End of Session - 08/30/22 1334     Visit Number 27    Date for PT Re-Evaluation 09/06/22   PN 05/09/20, 07/21/20, 12/29/20, 04/20/21, 11/09/21, 06/07/22   Authorization Type 4/35  visits of PT./OT/ chiro per year    PT Start Time [id number]    PT Stop Time 1415    PT Time Calculation (min) 42 min    Activity Tolerance Patient tolerated treatment well;No increased pain    Behavior During Therapy [facility] for tasks assessed/performed                 No past medical history on file.      Past Surgical History:  Procedure Laterality Date   ELBOW SURGERY Left     WRIST SURGERY            Patient Active Problem List    Diagnosis Date Noted   Excessive sleepiness 01/30/2021   Urinary frequency 12/23/2019   Pelvic pain 12/23/2019   Low back pain 12/22/2019      REFERRING DIAG:      Diagnosis Description  Pelvic Floor dysfunction      THERAPY DIAG:  Sacrococcygeal disorders, not elsewhere classified   Pelvic pain   Urinary frequency   Other abnormalities of gait and mobility   Chronic bilateral low back pain with left-sided sciatica   Abnormal posture   Rationale for Evaluation and Treatment Rehabilitation   PERTINENT HISTORY:  R groin injury 2017R , anxiety, stress Fx in R shin, low back as kid. 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 Pt also enjoyed cycling and stopped when the pain started. Pt used to cycle 2-3 hours 200 miles / day. Pt did not have a stretch routine. Pt completed Pelvic PT via telehealth one year and tried stretching but it made it worse on R groin injury 2017R , anxiety, stress Fx in R shin, low back as kid. 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 Pt also  enjoyed cycling and stopped when the pain started. Pt used to cycle 2-3 hours 200 miles / day. Pt did not have a stretch routine. Pt completed Pelvic PT via telehealth one year and tried stretching but it made it worse   PRECAUTIONS:    SUBJECTIVE:    Subjective Assessment -       Subjective Pt saw an acupuncturist for 2 sessions. Pt noticed 30% reduction in pain that lasted one day.   Last Monday, pt performed lifting 80 lb bags x 50 bags. The day after, pt noticed fecal leakage and tissued tissue pushing out of anus. It is nervy to the touch.  Pt has had fecal leakage 6 x in the past 4 years.    Pt cut back on his heating pad down to 20 mins a day and pt reported he had been addicted to it.     Pt has been exercising on elliptical and performing stretches before and after across 3 x a week for the past 4 weeks with some increased tightness of pelvic and urgency but he is weighing the benefit of cardio workout over the increased pelvic pain and urgency.  Pertinent History R groin injury 2017 ,  anxiety, stress Fx in R shin, low back as kid. 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 Pt also enjoyed cycling and stopped when the pain started. Pt used to cycle 2-3 hours 200 miles / day. Pt did not have a stretch routine. Pt completed Pelvic PT via telehealth one year and tried stretching but it made it worse.                         TODAY'S TREATMENT:    [facility] PT Assessment - 08/30/22 1526       Observation/Other Assessments   Observations posterior pelvic tilt sitting  in chair, able to tolerate sitting on foot stool, yoga blocked under knees, towel under ankles for support  , able to maintain anterior tilt of pelvis             [facility] Adult PT Treatment/Exercise - 08/30/22 1622       Therapeutic Activites    Other Therapeutic Activities active listening to updates across past 4 weeks      Neuro Re-ed     Neuro Re-ed Details  cued for sitting on low stool, hip stretch, pelvic floor and relaxation practice , upight sitting with anterior tilt of pelvis                 PATIENT EDUCATION: Education details: showed anatomy of mm attached by ischial tuberosity and explained correlation to his pain with functional tasks. Explained the role of decreased Wbing on RLE in simulated tennis movements and why hip abd/ ext were weaker on R with more overactivity on L side    Person educated: Patient Education method: Explanation, Demonstration, Tactile cues, Verbal cues, and Handouts   Education comprehension: verbalized understanding, returned demonstration, verbal cues required, tactile cues required, and needs further education     HOME EXERCISE PROGRAM: See pt instruction section            PT Long Term Goals -               PT LONG TERM GOAL #1    Title Pt will demo decreased abdominal separation from 4 fingers width  to < 1 fingers width to improve IAP system for pelvic floor function     Time 4     Period Weeks     Status Achieved          PT LONG TERM GOAL #2    Title Pt will demo improved posture with more anterior tilt of pelvis, increased lumbar lordosis, and no cues for proper sitting/ standing posture  in order to minimize CLBP and return to more functional activities     Time 6     Period Weeks     Status Achieved          PT LONG TERM GOAL #3    Title Pt will demo no spinal deviation and more levelled pelvic girdle across 2 visits in order to advance towards deep core/ thoracolumbar strengthening exercises     Time 2     Period Weeks     Status Achieved          PT LONG TERM GOAL #4    Title Pt will demo decreased R pelvic floor mm tightness across 2 visits in order to minimize pelvic pain and straining with bowel movements  and optimize IAP system properly     Time 8     Period Weeks     Status Achieved          PT LONG TERM GOAL #5    Title Pt's FOTO score  for Pain to decrease from 29pts to < 24pts and Urinary from 38 pts to < 30 pts and Bowel from 17 pts to < 12 pts and  Bowel Leakage improve 59 pts to > 64 pts     Baseline 11/8:Urinary  38 pts to 33 pts, PFDI Bowel 17pts to 4pts, bowel Leakage 59pts to 72 pts   10/22/21 :  Pain 29pt --> 4pts,  Urinary 38 pts--> 4pts, Bowel 18 pts-->0 pts, Leakage  59 pts --> 83 pts     Time 10     Period Weeks     Status Achieved          PT LONG TERM GOAL #6    Title Pt will report no urge to urinate when sitting or bending forward to put on his back pack  in order to attend meetings or leave the house     Baseline 10/12/21: 50% improvement)  11/09/21: 60% improvement     Time 8     Period Weeks     Status Partially Met  03/29/2022             PT LONG TERM GOAL #7    Title Pt 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     Time 10     Period Weeks     Status Achieved          PT LONG TERM GOAL  #10    TITLE Pt will be able to hike for 6mles on low grade trail and/or 2 miles on a trail with inclines and report no pain .     Time 6     Period Months     Status Partially Met     Target Date 03/15/2022         PT LONG TERM GOAL  #11    TITLE Pt will maintain 2 cm of plantar arches and and no tightness at L ITband/ around GT and lateral leg, hypomobility at lateral foot with compliance with modification with tossing tennis balls repeated at work      Baseline (10/12/21:  2cm B)     Time 10     Period Weeks     Status Achieved     Target Date 10/12/21          PT LONG TERM GOAL  #12  TITLE Pt will report decreased urgency with incline walking in order to hike with family     Time 10     Period Weeks     Status On-going     Target Date 08/16/21             PT LONG TERM GOAL  #13    TITLE Pt will demo no tensions nor tenderness at ischiocavernosus on L   ( 2 o'clock position_) and at L ischial tuberosity across 2 visits     Status On going     Target Date 08/16/2022                      Plan -       Clinical Impression Statement  Provided active listening to pt's update in the past 4 weeks.  Able to tolerate sitting on foot stool, yoga blocked under knees, towel under ankles for support , able to maintain anterior tilt of pelvis for guided relaxation and hip flexor stretch. Pt reported more relaxation of pelvic floor and overall body post training. Plan to continue to focus on relaxation practices before adding gym activities.   Pt has reported he has limited sitting on heating pad for 20 min max as advised.   Pt continues to benefit from skilled PT to achieve to remaining goals.       Examination-Activity Limitations Toileting;Sit     Stability/Clinical Decision Making Evolving/Moderate complexity     Rehab Potential Good     PT Frequency BiMonthy     PT Duration Other (comment)   5 months    PT Treatment/Interventions Neuromuscular re-education;Patient/family education;Therapeutic exercise;Scar mobilization;Taping;Manual techniques;Moist Heat;Therapeutic activities;Gait training;Balance training     Consulted and Agree with Plan of Care Patient                       Jerl Mina, PT

## 2022-08-30 NOTE — Patient Instructions (Signed)
Sit on low stool, yoga blocks under knees, so knees fall below hips Throw pillow under ankles to prevent them from twisting  Note the points of contact  Inhale 2 puase Exhale 2 pause  2 reps   Inhale 2 puase Exhale 4 count  pause   ___  AH, Oh, M nasal sound up  Back of head ligning up to midback , shoulders relax and b ack  Anterior tilt of pelvis

## 2022-09-13 ENCOUNTER — Ambulatory Visit: Payer: 59 | Admitting: Physical Therapy

## 2022-09-27 ENCOUNTER — Ambulatory Visit: Payer: 59 | Admitting: Physical Therapy

## 2022-10-11 ENCOUNTER — Ambulatory Visit: Payer: 59 | Admitting: Physical Therapy

## 2022-10-25 ENCOUNTER — Encounter: Payer: 59 | Admitting: Physical Therapy

## 2022-11-06 DIAGNOSIS — R102 Pelvic and perineal pain: Secondary | ICD-10-CM | POA: Diagnosis not present

## 2022-11-08 ENCOUNTER — Telehealth: Payer: Self-pay | Admitting: Physical Therapy

## 2022-11-08 ENCOUNTER — Ambulatory Visit: Payer: 59 | Attending: Physician Assistant | Admitting: Physical Therapy

## 2022-11-08 NOTE — Telephone Encounter (Signed)
Physical therapist called pt re: missed appt today. Provided encouragement and to communicate on whether pt would like to keep upcoming visits or would like resources.

## 2023-02-05 DIAGNOSIS — M5431 Sciatica, right side: Secondary | ICD-10-CM | POA: Diagnosis not present

## 2023-02-05 DIAGNOSIS — M9905 Segmental and somatic dysfunction of pelvic region: Secondary | ICD-10-CM | POA: Diagnosis not present

## 2023-02-05 DIAGNOSIS — M9903 Segmental and somatic dysfunction of lumbar region: Secondary | ICD-10-CM | POA: Diagnosis not present

## 2023-02-05 DIAGNOSIS — M9902 Segmental and somatic dysfunction of thoracic region: Secondary | ICD-10-CM | POA: Diagnosis not present

## 2023-02-06 DIAGNOSIS — M9905 Segmental and somatic dysfunction of pelvic region: Secondary | ICD-10-CM | POA: Diagnosis not present

## 2023-02-06 DIAGNOSIS — M5431 Sciatica, right side: Secondary | ICD-10-CM | POA: Diagnosis not present

## 2023-02-06 DIAGNOSIS — M9903 Segmental and somatic dysfunction of lumbar region: Secondary | ICD-10-CM | POA: Diagnosis not present

## 2023-02-06 DIAGNOSIS — M9902 Segmental and somatic dysfunction of thoracic region: Secondary | ICD-10-CM | POA: Diagnosis not present

## 2023-02-08 DIAGNOSIS — M9903 Segmental and somatic dysfunction of lumbar region: Secondary | ICD-10-CM | POA: Diagnosis not present

## 2023-02-08 DIAGNOSIS — M9905 Segmental and somatic dysfunction of pelvic region: Secondary | ICD-10-CM | POA: Diagnosis not present

## 2023-02-08 DIAGNOSIS — M5431 Sciatica, right side: Secondary | ICD-10-CM | POA: Diagnosis not present

## 2023-02-08 DIAGNOSIS — M9902 Segmental and somatic dysfunction of thoracic region: Secondary | ICD-10-CM | POA: Diagnosis not present

## 2023-03-01 DIAGNOSIS — M9905 Segmental and somatic dysfunction of pelvic region: Secondary | ICD-10-CM | POA: Diagnosis not present

## 2023-03-01 DIAGNOSIS — M5431 Sciatica, right side: Secondary | ICD-10-CM | POA: Diagnosis not present

## 2023-03-01 DIAGNOSIS — M9902 Segmental and somatic dysfunction of thoracic region: Secondary | ICD-10-CM | POA: Diagnosis not present

## 2023-03-01 DIAGNOSIS — M9903 Segmental and somatic dysfunction of lumbar region: Secondary | ICD-10-CM | POA: Diagnosis not present

## 2023-03-12 DIAGNOSIS — L570 Actinic keratosis: Secondary | ICD-10-CM | POA: Diagnosis not present

## 2023-03-12 DIAGNOSIS — Z5181 Encounter for therapeutic drug level monitoring: Secondary | ICD-10-CM | POA: Diagnosis not present

## 2023-03-12 DIAGNOSIS — B351 Tinea unguium: Secondary | ICD-10-CM | POA: Diagnosis not present

## 2023-04-25 DIAGNOSIS — M549 Dorsalgia, unspecified: Secondary | ICD-10-CM | POA: Diagnosis not present

## 2023-04-25 DIAGNOSIS — Z1211 Encounter for screening for malignant neoplasm of colon: Secondary | ICD-10-CM | POA: Diagnosis not present

## 2023-04-25 DIAGNOSIS — Z6827 Body mass index (BMI) 27.0-27.9, adult: Secondary | ICD-10-CM | POA: Diagnosis not present
